# Patient Record
Sex: Female | Born: 1955 | Race: White | Hispanic: No | Marital: Married | Smoking: Former smoker
Health system: Southern US, Community
[De-identification: ages and names within clinical notes are randomized; demographics above are authoritative.]

## PROBLEM LIST (undated history)

## (undated) DIAGNOSIS — D649 Anemia, unspecified: Secondary | ICD-10-CM

## (undated) DIAGNOSIS — G4700 Insomnia, unspecified: Secondary | ICD-10-CM

## (undated) DIAGNOSIS — K219 Gastro-esophageal reflux disease without esophagitis: Secondary | ICD-10-CM

## (undated) DIAGNOSIS — R197 Diarrhea, unspecified: Secondary | ICD-10-CM

## (undated) DIAGNOSIS — K449 Diaphragmatic hernia without obstruction or gangrene: Secondary | ICD-10-CM

## (undated) DIAGNOSIS — E782 Mixed hyperlipidemia: Secondary | ICD-10-CM

## (undated) DIAGNOSIS — Z8619 Personal history of other infectious and parasitic diseases: Secondary | ICD-10-CM

## (undated) DIAGNOSIS — J42 Unspecified chronic bronchitis: Secondary | ICD-10-CM

## (undated) DIAGNOSIS — I10 Essential (primary) hypertension: Secondary | ICD-10-CM

## (undated) DIAGNOSIS — F329 Major depressive disorder, single episode, unspecified: Secondary | ICD-10-CM

## (undated) HISTORY — DX: Diarrhea, unspecified: R19.7

## (undated) HISTORY — DX: Essential (primary) hypertension: I10

## (undated) HISTORY — DX: Insomnia, unspecified: G47.00

## (undated) HISTORY — DX: Personal history of other infectious and parasitic diseases: Z86.19

## (undated) HISTORY — DX: Diaphragmatic hernia without obstruction or gangrene: K44.9

## (undated) HISTORY — DX: Mixed hyperlipidemia: E78.2

## (undated) HISTORY — DX: Anemia, unspecified: D64.9

## (undated) HISTORY — PX: TONSILLECTOMY: SUR1361

## (undated) HISTORY — DX: Major depressive disorder, single episode, unspecified: F32.9

## (undated) HISTORY — DX: Gastro-esophageal reflux disease without esophagitis: K21.9

---

## 2000-03-29 ENCOUNTER — Ambulatory Visit (HOSPITAL_BASED_OUTPATIENT_CLINIC_OR_DEPARTMENT_OTHER): Admission: RE | Admit: 2000-03-29 | Discharge: 2000-03-29 | Payer: Self-pay | Admitting: General Surgery

## 2000-03-29 ENCOUNTER — Encounter (INDEPENDENT_AMBULATORY_CARE_PROVIDER_SITE_OTHER): Payer: Self-pay | Admitting: *Deleted

## 2000-11-26 ENCOUNTER — Other Ambulatory Visit: Admission: RE | Admit: 2000-11-26 | Discharge: 2000-11-26 | Payer: Self-pay | Admitting: Obstetrics and Gynecology

## 2001-11-20 ENCOUNTER — Encounter (INDEPENDENT_AMBULATORY_CARE_PROVIDER_SITE_OTHER): Payer: Self-pay | Admitting: *Deleted

## 2001-11-20 ENCOUNTER — Ambulatory Visit (HOSPITAL_BASED_OUTPATIENT_CLINIC_OR_DEPARTMENT_OTHER): Admission: RE | Admit: 2001-11-20 | Discharge: 2001-11-20 | Payer: Self-pay | Admitting: General Surgery

## 2001-12-22 ENCOUNTER — Other Ambulatory Visit: Admission: RE | Admit: 2001-12-22 | Discharge: 2001-12-22 | Payer: Self-pay | Admitting: Obstetrics and Gynecology

## 2003-02-15 ENCOUNTER — Other Ambulatory Visit: Admission: RE | Admit: 2003-02-15 | Discharge: 2003-02-15 | Payer: Self-pay | Admitting: *Deleted

## 2004-04-27 ENCOUNTER — Other Ambulatory Visit: Admission: RE | Admit: 2004-04-27 | Discharge: 2004-04-27 | Payer: Self-pay | Admitting: Obstetrics and Gynecology

## 2005-06-05 ENCOUNTER — Other Ambulatory Visit: Admission: RE | Admit: 2005-06-05 | Discharge: 2005-06-05 | Payer: Self-pay | Admitting: Obstetrics and Gynecology

## 2005-08-06 ENCOUNTER — Emergency Department (HOSPITAL_COMMUNITY): Admission: EM | Admit: 2005-08-06 | Discharge: 2005-08-06 | Payer: Self-pay | Admitting: Emergency Medicine

## 2005-08-06 ENCOUNTER — Ambulatory Visit: Payer: Self-pay | Admitting: Internal Medicine

## 2005-08-07 ENCOUNTER — Inpatient Hospital Stay (HOSPITAL_COMMUNITY): Admission: AD | Admit: 2005-08-07 | Discharge: 2005-08-10 | Payer: Self-pay | Admitting: Internal Medicine

## 2005-08-07 ENCOUNTER — Ambulatory Visit: Payer: Self-pay | Admitting: Internal Medicine

## 2005-08-20 ENCOUNTER — Ambulatory Visit: Payer: Self-pay | Admitting: Internal Medicine

## 2006-03-11 ENCOUNTER — Ambulatory Visit: Payer: Self-pay | Admitting: Internal Medicine

## 2007-08-04 ENCOUNTER — Ambulatory Visit: Payer: Self-pay | Admitting: Internal Medicine

## 2008-03-06 ENCOUNTER — Encounter: Payer: Self-pay | Admitting: *Deleted

## 2008-03-06 DIAGNOSIS — Z9189 Other specified personal risk factors, not elsewhere classified: Secondary | ICD-10-CM | POA: Insufficient documentation

## 2008-03-06 DIAGNOSIS — Z9089 Acquired absence of other organs: Secondary | ICD-10-CM | POA: Insufficient documentation

## 2008-03-06 DIAGNOSIS — Z872 Personal history of diseases of the skin and subcutaneous tissue: Secondary | ICD-10-CM | POA: Insufficient documentation

## 2008-03-06 DIAGNOSIS — K219 Gastro-esophageal reflux disease without esophagitis: Secondary | ICD-10-CM | POA: Insufficient documentation

## 2008-03-06 DIAGNOSIS — Z8719 Personal history of other diseases of the digestive system: Secondary | ICD-10-CM | POA: Insufficient documentation

## 2008-07-28 ENCOUNTER — Ambulatory Visit: Payer: Self-pay | Admitting: Internal Medicine

## 2008-07-28 DIAGNOSIS — G44209 Tension-type headache, unspecified, not intractable: Secondary | ICD-10-CM | POA: Insufficient documentation

## 2008-07-29 ENCOUNTER — Telehealth: Payer: Self-pay | Admitting: Internal Medicine

## 2008-11-05 ENCOUNTER — Ambulatory Visit: Payer: Self-pay | Admitting: Internal Medicine

## 2008-11-06 DIAGNOSIS — I10 Essential (primary) hypertension: Secondary | ICD-10-CM | POA: Insufficient documentation

## 2008-12-06 ENCOUNTER — Ambulatory Visit: Payer: Self-pay | Admitting: Internal Medicine

## 2008-12-06 LAB — CONVERTED CEMR LAB
CO2: 28 meq/L (ref 19–32)
Calcium: 9.6 mg/dL (ref 8.4–10.5)
Creatinine, Ser: 0.7 mg/dL (ref 0.4–1.2)

## 2010-01-17 ENCOUNTER — Ambulatory Visit: Payer: Self-pay | Admitting: Internal Medicine

## 2010-01-17 DIAGNOSIS — IMO0002 Reserved for concepts with insufficient information to code with codable children: Secondary | ICD-10-CM | POA: Insufficient documentation

## 2010-04-10 ENCOUNTER — Encounter (INDEPENDENT_AMBULATORY_CARE_PROVIDER_SITE_OTHER): Payer: Self-pay | Admitting: *Deleted

## 2010-06-06 ENCOUNTER — Encounter (INDEPENDENT_AMBULATORY_CARE_PROVIDER_SITE_OTHER): Payer: Self-pay | Admitting: *Deleted

## 2010-06-09 ENCOUNTER — Ambulatory Visit: Payer: Self-pay | Admitting: Gastroenterology

## 2010-11-06 ENCOUNTER — Ambulatory Visit
Admission: RE | Admit: 2010-11-06 | Discharge: 2010-11-06 | Payer: Self-pay | Source: Home / Self Care | Attending: Internal Medicine | Admitting: Internal Medicine

## 2010-11-06 DIAGNOSIS — M549 Dorsalgia, unspecified: Secondary | ICD-10-CM | POA: Insufficient documentation

## 2010-11-28 NOTE — Miscellaneous (Signed)
Summary: [facility] Previsit/prep  Clinical Lists Changes  Medications: Added new medication of MOVIPREP 100 GM  SOLR (PEG-KCL-NACL-NASULF-NA ASC-C) As per prep instructions. - Signed Rx of MOVIPREP 100 GM  SOLR (PEG-KCL-NACL-NASULF-NA ASC-C) As per prep instructions.;  #1 x 0;  Signed;  Entered by: Wyona Almas RN;  Authorized by: Meryl Dare MD Clementeen Graham;  Method used: Electronically to [facility] [address] [place], [place], [place]  [address], Ph: [phone], Fax: [phone] Observations: Added new observation of NKA: T (06/09/2010 8:57)    Prescriptions: MOVIPREP 100 GM  SOLR (PEG-KCL-NACL-NASULF-NA ASC-C) As per prep instructions.  #1 x 0   Entered by:   Wyona Almas RN   Authorized by:   Meryl Dare MD [facility]   Signed by:   Wyona Almas RN on 06/09/2010   Method used:   Electronically to        [facility]ail[facility]       [address]       [place], [place]  [address]       Ph: [phone]       Fax: [phone]   RxID:   [id number]

## 2010-11-28 NOTE — Letter (Signed)
Summary: Moviprep Instructions  [facility]  [address]   [place], [place] [address]   Phone: [phone]  Fax: [phone]       Morgan Harper    27-Sep-1956    MRN: [id number]        Procedure Day /Date:  Friday   06-23-10     Arrival Time:  9:00 a.m.     Procedure Time:  10:00 a.m.     Location of Procedure:                    x   [facility] (4th Floor)                        PREPARATION FOR COLONOSCOPY WITH MOVIPREP   Starting 5 days prior to your procedure 06-18-10 do not eat nuts, seeds, popcorn, corn, beans, peas,  salads, or any raw vegetables.  Do not take any fiber supplements (e.g. Metamucil, Citrucel, and Benefiber).  THE DAY BEFORE YOUR PROCEDURE         DATE: 06-22-10   DAY: Thursday  1.  Drink clear liquids the entire day-NO SOLID FOOD  2.  Do not drink anything colored red or purple.  Avoid juices with pulp.  No orange juice.  3.  Drink at least 64 oz. (8 glasses) of fluid/clear liquids during the day to prevent dehydration and help the prep work efficiently.  CLEAR LIQUIDS INCLUDE: Water Jello Ice Popsicles Tea (sugar ok, no milk/cream) Powdered fruit flavored drinks Coffee (sugar ok, no milk/cream) Gatorade Juice: apple, white grape, white cranberry  Lemonade Clear bullion, consomm, broth Carbonated beverages (any kind) Strained chicken noodle soup Hard Candy                             4.  In the morning, mix first dose of MoviPrep solution:    Empty 1 Pouch A and 1 Pouch B into the disposable container    Add lukewarm drinking water to the top line of the container. Mix to dissolve    Refrigerate (mixed solution should be used within 24 hrs)  5.  Begin drinking the prep at 5:00 p.m. The MoviPrep container is divided by 4 marks.   Every 15 minutes drink the solution down to the next mark (approximately 8 oz) until the full liter is complete.   6.  Follow completed prep with 16 oz of clear liquid of your  choice (Nothing red or purple).  Continue to drink clear liquids until bedtime.  7.  Before going to bed, mix second dose of MoviPrep solution:    Empty 1 Pouch A and 1 Pouch B into the disposable container    Add lukewarm drinking water to the top line of the container. Mix to dissolve    Refrigerate  THE DAY OF YOUR PROCEDURE      DATE:  06-23-10  DAY: Friday  Beginning at 5:00 a.m. (5 hours before procedure):         1. Every 15 minutes, drink the solution down to the next mark (approx 8 oz) until the full liter is complete.  2. Follow completed prep with 16 oz. of clear liquid of your choice.    3. You may drink clear liquids until 8:00 a.m.  (2 HOURS BEFORE PROCEDURE).   MEDICATION INSTRUCTIONS  Unless otherwise instructed, you should take regular prescription medications with a small sip of water  as early as possible the morning of your procedure.          OTHER INSTRUCTIONS  You will need a responsible adult at least 56 years of age to accompany you and drive you home.   This person must remain in the waiting room during your procedure.  Wear loose fitting clothing that is easily removed.  Leave jewelry and other valuables at home.  However, you may wish to bring a book to read or  an iPod/MP3 player to listen to music as you wait for your procedure to start.  Remove all body piercing jewelry and leave at home.  Total time from sign-in until discharge is approximately 2-3 hours.  You should go home directly after your procedure and rest.  You can resume normal activities the  day after your procedure.  The day of your procedure you should not:   Drive   Make legal decisions   Operate machinery   Drink alcohol   Return to work  You will receive specific instructions about eating, activities and medications before you leave.    The above instructions have been reviewed and explained to me by  Wyona Almas RN  June 09, 2010 9:17 AM     I  fully understand and can verbalize these instructions _____________________________ Date _________

## 2010-11-28 NOTE — Letter (Signed)
Summary: Previsit letter  [facility]  [address] [place], [place] [address]   Phone: [phone]  Fax: [phone]       04/10/2010 MRN: [id number]  [facility] [address] [place], [place]  [address]  Dear Ms. Galla,  Welcome to the Gastroenterology Division at [facility].    You are scheduled to see a nurse for your pre-procedure visit on 05/09/2010 at 9:30AM on the 3rd floor at [facility], [address]  We ask that you try to arrive at our office 15 minutes prior to your appointment time to allow for check-in.  Your nurse visit will consist of discussing your medical and surgical history, your immediate family medical history, and your medications.    Please bring a complete list of all your medications or, if you prefer, bring the medication bottles and we will list them.  We will need to be aware of both prescribed and over the counter drugs.  We will need to know exact dosage information as well.  If you are on blood thinners (Coumadin, Plavix, Aggrenox, Ticlid, etc.) please call our office today/prior to your appointment, as we need to consult with your physician about holding your medication.   Please be prepared to read and sign documents such as consent forms, a financial agreement, and acknowledgement forms.  If necessary, and with your consent, a friend or relative is welcome to sit-in on the nurse visit with you.  Please bring your insurance card so that we may make a copy of it.  If your insurance requires a referral to see a specialist, please bring your referral form from your primary care physician.  No co-pay is required for this nurse visit.     If you cannot keep your appointment, please call [phone] to cancel or reschedule prior to your appointment date.  This allows [place] the opportunity to schedule an appointment for another patient in need of care.    Thank you for choosing [facility] for your medical  needs.  We appreciate the opportunity to care for you.  Please visit [place] at our website  to learn more about our practice.                     Sincerely.                                                                                                                   The Gastroenterology Division

## 2010-11-28 NOTE — Assessment & Plan Note (Signed)
Summary: FU   STC   Vital Signs:  Patient profile:   55 year old female Height:      61 inches (154.94 cm) Weight:      154.75 pounds (70.34 kg) BMI:     29.35 O2 Sat:      97 % on Room air Temp:     98.2 degrees F (36.78 degrees C) oral Pulse rate:   93 / minute BP sitting:   150 / 90  (left arm) Cuff size:   large  Vitals Entered By: Bill Salinas CMA (January 17, 2010 3:27 PM) Sydell Axon SMA  O2 Flow:  Room air CC: Pt c/o sharp/shocking pain followed by numbness in L knee while kneeling. last fluvax 09/2009, last mammogram and pap 02/2009, but is scheduled for appts next month per pt. //[place]   Primary Care Provider:  Jnaya Butrick  CC:  Pt c/o sharp/shocking pain followed by numbness in L knee while kneeling. last fluvax 09/2009, last mammogram and pap 02/2009, and but is scheduled for appts next month per pt. //[place].  History of Present Illness: Having left knee pain with kneeling, tender to touch, numb. No pain with walking or standing. Minimal swelling. The pain with kneeling is "electrical" No recollection of injury or other precipitating event. It has been a problem for 6 months but it is getting worse.   Current Medications (verified): 1)  Prevacid 30 Mg  Cpdr (Lansoprazole) .... Take Once Daily  Allergies (verified): No Known Drug Allergies  Past History:  Past Medical History: Last updated: 11/05/2008 UNSPECIFIED ESSENTIAL HYPERTENSION (ICD-401.9) HEADACHE, TENSION (ICD-307.81) URTICARIA, HX OF (ICD-V13.3) INSOMNIA, HX OF (ICD-V15.89) TONSILLECTOMY, HX OF (ICD-V45.79) Hx of ELEVATED BLOOD PRESSURE (ICD-796.2) HIATAL HERNIA, HX OF (ICD-V12.79) GERD (ICD-530.81)      Past Surgical History: Last updated: 03/06/2008 TONSILLECTOMY, HX OF (ICD-V45.79)  Family History: Last updated: 07/28/2008 Father - 1936: doesn't know health history Mother - 1939: HTN, allergies, Asthma Neg- breast or colon cancer; CAD GF with DM  Social History: Last updated:  07/28/2008 [employer] Married '96 no children work: Insurance account manager.  Risk Factors: Smoking Status: current (03/06/2008)  Review of Systems  The patient denies anorexia, fever, weight loss, decreased hearing, hoarseness, syncope, dyspnea on exertion, prolonged cough, abdominal pain, severe indigestion/heartburn, muscle weakness, difficulty walking, and enlarged lymph nodes.    Physical Exam  General:  WNWD white female in no distress Head:  normocephalic.   Eyes:  pupils equal, pupils round, corneas and lenses clear, and no injection.   Lungs:  normal respiratory effort and normal breath sounds.   Heart:  normal rate and regular rhythm.   Msk:  left knee with normal range of motion, no click, no crepitis, no deformity. Very small effusion. Tender to palp at the lateral aspect - over the tibial condyle.  Neurologic:  small area of decreased sensation over the lateral tibial condyle.   Impression & Recommendations:  Problem # 1:  PES ANSERINUS TENDINITIS OR BURSITIS (ICD-726.61) Normal joint. Exam is consistent with pes anserinus tendonitis with bursitis. Pain with kneeling due to bursitis with effusion.  Plan - heat and lineament           otc NSAIDs           if no relief - steroid injection  Complete Medication List: 1)  Prevacid 30 Mg Cpdr (Lansoprazole) .... Take once daily  Other Orders: Tdap => 21yrs IM ([address]) Admin 1st Vaccine ([address])    Immunizations Administered:  Tetanus Vaccine:    Vaccine Type: Tdap    Site: right deltoid    Mfr: GlaxoSmithKline    Dose: 0.5 ml    Route: IM    Given by: Ami Bullins CMA //Administered by Sydell Axon SMA    Exp. Date: 01/21/2012    Lot #: [id number]    VIS given: 09/16/07 version given January 17, 2010.

## 2010-11-30 NOTE — Assessment & Plan Note (Signed)
Summary: pulled back muscle/cd   Vital Signs:  Patient profile:   55 year old female Height:      61 inches Weight:      149.38 pounds BMI:     28.33 O2 Sat:      97 % on Room air Temp:     98 degrees F oral Pulse rate:   95 / minute BP sitting:   162 / 102  (left arm) Cuff size:   regular  Vitals Entered By: Zella Ball Ewing CMA Duncan Dull) (November 06, 2010 2:52 PM)  O2 Flow:  Room air CC: pulled muscle in back/RE   Primary Care Provider:  Norins  CC:  pulled muscle in back/RE.  History of Present Illness: here with acute onset 2-3 days left back pain, mild to mod, constant dull with sharp flares, starting after packing up xmas decorations in the home and lifitng and twisting heavy boxes;  pain worse to bend adn twist since then, and just not improved even after 3 days. heating pad helps temporarily.  No bowel or bladder changes. , or radiation LE pain/weak/numb. Alleve no help this am , so made appt.  No trauma, fall, fever, recetn wt loss. Pt denies CP, worsening sob, doe, wheezing, orthopnea, pnd, worsening LE edema, palps, dizziness or syncope  Pt denies new neuro symptoms such as headache, facial or extremity weakness   Preventive Screening-Counseling & Management      Drug Use:  no.    Problems Prior to Update: 1)  Back Pain  (ICD-724.5) 2)  Pes Anserinus Tendinitis or Bursitis  (ICD-726.61) 3)  Unspecified Essential Hypertension  (ICD-401.9) 4)  Headache, Tension  (ICD-307.81) 5)  Urticaria, Hx of  (ICD-V13.3) 6)  Insomnia, Hx of  (ICD-V15.89) 7)  Tonsillectomy, Hx of  (ICD-V45.79) 8)  Hx of Elevated Blood Pressure  (ICD-796.2) 9)  Hiatal Hernia, Hx of  (ICD-V12.79) 10)  Gerd  (ICD-530.81)  Medications Prior to Update: 1)  Prevacid 30 Mg  Cpdr (Lansoprazole) .... Take Once Daily  Current Medications (verified): 1)  Prevacid 30 Mg  Cpdr (Lansoprazole) .... Take Once Daily 2)  Tramadol Hcl 50 Mg Tabs (Tramadol Hcl) .Marland Kitchen.. 1po Q 6 Hrs As Needed 3)  Flexeril 5 Mg Tabs  (Cyclobenzaprine Hcl) .Marland Kitchen.. 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  atraumatic.   Eyes:  pupils equal, pupils round, corneas and lenses clear, and no injection.   Ears:  R ear normal and L ear normal.   Nose:  no external deformity and no nasal discharge.   Mouth:  no gingival abnormalities and pharynx pink and moist.   Neck:  supple and no masses.  Lungs:  normal respiratory effort and normal breath sounds.   Heart:  normal rate and regular rhythm.   Abdomen:  soft, non-tender, and normal bowel sounds.   Msk:  no joint tenderness and no joint swelling., spine nontender, no paravertebral tender or swelling;  has some mild left flank tender, no rash Extremities:  no edema, no erythema  Neurologic:  strength normal in all extremities, sensation intact to light touch, and gait normal.     Impression & Recommendations:  Problem # 1:  BACK PAIN (ICD-724.5)  left flank area - agree most likely MSK strain , mod to severe and persistent - treat as above,  f/u any worsening signs or symptoms   Her updated medication list for this problem includes:    Tramadol Hcl 50 Mg Tabs (Tramadol hcl) .Marland Kitchen... 1po q 6 hrs as needed    Flexeril 5 Mg Tabs (Cyclobenzaprine hcl) .Marland Kitchen... 1po three times a day as needed  Complete Medication List: 1)  Prevacid 30 Mg Cpdr (Lansoprazole) .... Take once daily 2)  Tramadol Hcl 50 Mg Tabs (Tramadol hcl) .Marland Kitchen.. 1po q 6 hrs as needed 3)  Flexeril 5 Mg Tabs (Cyclobenzaprine hcl) .Marland Kitchen.. 1po three times a day as needed  Patient Instructions: 1)  Please take all new medications as prescribed 2)  Continue all previous medications as before this visit  3)  Please schedule an appointment with your primary doctor as needed Prescriptions: FLEXERIL 5 MG TABS (CYCLOBENZAPRINE HCL) 1po three times a day as needed  #50 x 1   Entered and Authorized by:   Corwin Levins MD   Signed by:   Corwin Levins MD on 11/06/2010   Method used:   Print then Give to Patient   RxID:   [id number] TRAMADOL HCL 50 MG TABS (TRAMADOL HCL) 1po q 6 hrs as needed  #50 x 0   Entered and Authorized by:   Corwin Levins MD   Signed by:   Corwin Levins MD on 11/06/2010   Method used:   Print then Give to Patient   RxID:   [id number]    Orders Added: 1)  Est. Patient Level III [84696]

## 2011-03-16 NOTE — Discharge Summary (Signed)
Morgan Harper, MOYD              ACCOUNT NO.:  [id number]   MEDICAL RECORD NO.:  [id number]          PATIENT TYPE:  INP   LOCATION:  3031                         FACILITY:  [facility]   PHYSICIAN:  Rene Paci, M.D. LHCDATE OF BIRTH:  Jun 19, 1956   DATE OF ADMISSION:  08/07/2005  DATE OF DISCHARGE:  08/10/2005                                 DISCHARGE SUMMARY   DISCHARGE DIAGNOSIS:  Refractory urticaria.   HISTORY OF PRESENT ILLNESS:  The patient is a 55 year old white female who  was admitted on Friday, October with complaints of hives and itching.  The  patient was seen at urgent care and given IM steroids and IM Benadryl, as  well as started on p.o. prednisone.  The patient had a recurrent flare on  August 06, 2005 with a repeat injection of IM steroids and Benadryl and was  then seen by Dr. Debby Bud in the office on that day  The patient was admitted  for IV steroids.   PAST MEDICAL HISTORY:  GERD with hiatal hernia and no other significant  medical illnesses.   PAST SURGICAL HISTORY:  Tonsillectomy.   COURSE IN HOSPITAL:  Problem:  REFRACTORY URTICARIA. The patient was  admitted and placed on IV Solu-Medrol.  In addition, the patient was placed  on a proton pump inhibitor and loratadine.  The patient was noted to have  improvement in her symptoms with IV steroids and wishes to go home at this  time.  Our plan is to discontinued the patient to home with p.o. prednisone  taper.   MEDICATIONS AT DISCHARGE:  1.  Promethium.  Patient to resume same dose as before admission.  2.  Trest.  The patient is to resume same dose as before admission.  3.  Ioban 150 mg p.o. b.i.d.  4.  Prevacid 30 mg p.o. daily.  5.  Zyrtec 10 mg p.o. daily.  6.  Ranitidine 150 mg p.o. daily.  7.  Prednisone 50 mg p.o. q.12 h. 2 doses, then 40 mg q.12 h. x2 doses, then      40 mg p.o. daily for 4 days, then 20 mg p.o. daily for 4 days, then 10      mg daily for 6 days, then stop.  8.  Xanax 0.5 mg  every 6 hours p.r.n.  9.  Ambien 5 mg p.o. h.s. p.r.n.  10. Prescription provided for 50 tablets of 10 mg prednisone.  11. In addition the patient was given a prescription for 30 tablets of Xanax      0.5 mg and 20 tablets of Ambien 5 mg.   LABORATORIES AT DISCHARGE:  Hemoglobin 13.5, hematocrit 40.3, BUN 15,  creatinine 0.8.   FOLLOWUP:  A followup appointment has been scheduled for the patient to see  Dr. Debby Bud on October 23 at 9:15 a.m. The patient is to call Dr. Debby Bud  should she develop hives and go to the emergency room should she develop  shortness of breath.      Melissa S. Peggyann Juba, NP      Rene Paci, M.D. [facility]  Electronically Signed    MSO/MEDQ  D:  08/10/2005  T:  08/10/2005  Job:  [id number]

## 2011-03-16 NOTE — Op Note (Signed)
[place]  Patient:    NETASHA, WEHRLI Visit Number: [id number] MRN: [id number]          Service Type: DSU Location: [facility] Attending Physician:  Brandy Hale Dictated by:   Angelia Mould. Derrell Lolling, M.D. Proc. Date: 11/20/01 Admit Date:  11/20/2001   CC:         Sonda Primes, M.D. [facility]   Operative Report  PREOPERATIVE DIAGNOSIS: 1. A 2.0 cm subcutaneous cyst left base of neck. 2. An 8 mm diameter cyst right base of neck.  POSTOPERATIVE DIAGNOSIS: 1. A 2.0 cm subcutaneous cyst left base of neck. 2. An 8 mm diameter cyst right base of neck.  OPERATION PERFORMED:  Excision of 2.0 cm cyst left base of neck and excision of 8 mm cyst right base of neck.  SURGEON:  Angelia Mould. Derrell Lolling, M.D.  ANESTHESIA:  INDICATIONS FOR PROCEDURE:  The patient is a 55 year old white female who presents with a chronically inflamed sebaceous cyst of the left posterior neck at the base and also has a smaller cyst on the right side.  She has had these before and would like to have these excised to prevent abscess formation.  She is brought to the operating room electively.  DESCRIPTION OF PROCEDURE:  The patient was brought to the minor procedure room at the [facility] and placed prone on the operating table. The neck and shoulders were prepped and draped in a sterile fashion. Xylocaine 1% with epinephrine was used as local infiltration anesthetic.  In the left base of neck I made a 2.5 cm long to 3.0 cm long elliptical incision transversely and dissection was carried down to the deep subcutaneous tissue excising what appeared to be a chronically inflamed sebaceous cyst. The cyst and its wall was completely excised.  In the the right neck base, I made a 1.5 cm long elliptical incision transversely and excised a smaller sebaceous cyst.  Both of these wounds irrigated with dilute Betadine and saline.  Hemostasis was excellent.  Both wounds  were closed with subcuticular sutures of 3-0 Monocryl and Steri-Strips.  Clean bandages were placed.  The patient tolerated the procedure well and was returned to the waiting room in excellent condition.  Estimated blood loss was about 10 cc. Complications were none.  Sponge, needle and instrument counts were correct. Dictated by:   Angelia Mould. Derrell Lolling, M.D. Attending Physician:  Brandy Hale DD:  11/20/01 TD:  11/21/01 Job: [id number]

## 2011-03-16 NOTE — H&P (Signed)
Morgan Harper, Morgan Harper              ACCOUNT NO.:  [id number]   MEDICAL RECORD NO.:  [id number]           PATIENT TYPE:   LOCATION:                                 FACILITY:   PHYSICIAN:  Rosalyn Gess. Norins, M.D. [facility] OF BIRTH:  03-Sep-1956   DATE OF ADMISSION:  08/07/2005  DATE OF DISCHARGE:                                HISTORY & PHYSICAL   CHIEF COMPLAINT:  Urticaria.   HISTORY OF PRESENT ILLNESS:  Ms. Renk is a 55 year old Caucasian woman  who had the onset on Friday, October 6 of hives with itching.  She was seen  at urgent care, was given IM steroids, IM Benadryl, started on H2 blocker,  H1 blocker therapy as well as Zyrtec.  Patient was also started on  outpatient oral prednisone at 40 mg daily x2, 20 mg daily x2, 10 mg daily  x3.  She had a recurrent flare of urticaria and was seen back at urgent care  on August 06, 2005 in the a.m.  Was given a repeat injection of IM steroids  and Benadryl.  I saw her in the office August 06, 2005.  At that time she  still had some visible urticaria and was also having pruritus.  Patient was  told to increase her oral prednisone dose to 40 mg daily x3, 20 mg daily x3,  10 mg daily x6 and was to continue on H1 and H2 blocker therapy.  Patient  now returns because of resurgence of her urticaria with increasing pruritus  despite outpatient therapy with oral medications.  Patient is now for  admission for IV steroids.  Of note, the most likely precipitating factor  was nicotine patch, although we cannot be sure of the etiology of her  urticaria.   PAST MEDICAL HISTORY:   SURGICAL:  Tonsillectomy.   MEDICAL:  1.  Usual childhood diseases.  2.  GERD with hiatal hernia with no other significant medical illnesses.   HABITS:  Tobacco.  Patient has just recently quit smoking with a significant  smoking history.  No alcohol use.  Patient has no known prior drug  allergies.   SOCIAL HISTORY:  Patient is married.  She works in Airline [employer].  She  has a  supportive husband.   REVIEW OF SYSTEMS:  Negative for any fevers, sweats, or chills.  Her weight  has been stable.  She has had no cardiovascular, respiratory, GI, or GU  complaints.   PHYSICAL EXAMINATION:  VITAL SIGNS:  Patient is afebrile.  Blood pressure  140/88, heart rate 76, respirations 14 and unlabored.  GENERAL:  Well-nourished, well-developed woman who is in no acute distress.  HEENT:  Normocephalic, atraumatic.  EACs and TMs were normal.  Oropharynx  was without lesions.  Conjunctivae and sclerae were clear.  Pupils are  equal, round, and reactive to light and accommodation.  NECK:  Supple without thyromegaly.  NODES:  No adenopathy was noted in the cervical, supraclavicular regions.  CHEST:  No CVA tenderness.  LUNGS:  Clear to auscultation and percussion.  CARDIOVASCULAR:  2+ radial pulse.  No JVD or carotid bruits.  She  had a  quiet precordium with regular rate and rhythm without murmurs, rubs, or  gallops.  BREASTS:  Deferred to gynecology.  ABDOMEN:  Soft.  No guarding or rebound.  No organo/splenomegaly was noted.  PELVIC:  Deferred.  RECTAL:  Deferred.  EXTREMITIES:  Without clubbing, cyanosis.  No edema was noted.  There was no  deformity.  DERMATOLOGIC:  Patient has widespread macular, papular, erythematous lesions  starting at her forehead going all the way down to her toes with a  serpiginous outline which is changing as I look at it with significant  target lesions developing on her distal lower extremities.  Of note, the  patient in the 20 minutes I have been seeing her is having progressive  urticaria.  NEUROLOGIC:  Nonfocal with patient being awake, alert, oriented to person,  place, time, and context.   ASSESSMENT/PLAN:  1.  Refractory urticaria.  Patient with ongoing progressive urticaria      despite two rounds of IM steroids, oral steroids, H2 blocker, H1      blockers.  She has significant pruritus with this.  She has had no       respiratory distress or discomfort.  Precipitating factor may have been      a nicotine patch, although the patch has now been off for five or six      days.  There were no other changes in the patient's history.  She has      had no new foods, no new cosmetics, no new toweling, no new detergents      or other contact allergens.   PLAN:  Regular medical admission.  Solu-Medrol 80 mg IV q.8h. x3 doses, 80  mg IV q.12h. x2 doses, then 40 mg IV q.12h. if her urticaria is improving.  Loratadine 10 mg b.i.d., ranitidine 150 mg b.i.d., Xanax 0.5 mg q.6h.  p.r.n., Restoril 15 mg p.o. q.h.s. p.r.n., Protonix 40 mg p.o. q.a.m.,  laxative of choice, hydroxyzine 25 mg p.o. q.8h. p.r.n. refractory pruritus.  Laboratory work will include a CMET, ESR, and CBC with differential.  If the  patient's symptoms do not respond to this round of therapy, would need to  consult with allergy for in-hospital consultation.           ______________________________  Rosalyn Gess Norins, M.D. [facility]     MEN/MEDQ  D:  08/07/2005  T:  08/07/2005  Job:  [id number]

## 2011-03-16 NOTE — Op Note (Signed)
[place]. [facility]  Patient:    RAYME, BUI                       MRN: [id number] Proc. Date: 03/29/00 Adm. Date:  [id number] Disc. Date: [id number] Attending:  Brandy Hale CC:         Sonda Primes, M.D. [facility]                           Operative Report  PREOPERATIVE DIAGNOSIS:  Infected sebaceous cyst, left neck.  POSTOPERATIVE DIAGNOSIS:  Infected sebaceous cyst, left neck.  OPERATION:  Excision of 1.0 cm in diameter infected sebaceous cyst, left posterior neck.  SURGEON:  Angelia Mould. Derrell Lolling, M.D.  ASSISTANT:  ANESTHESIA:  INDICATIONS:  This is a 55 year old white female who developed a painful reddened area in the left posterior neck about three and a half weeks ago. She states this was the size of a golf ball.  She was placed on antibiotics and the pain and swelling subsided rapidly.  She has a small residual chronically inflamed cyst in the left posterior neck overlying the trapezius muscle.  She wants to have this excised to eliminate the risk of future infection.  DESCRIPTION OF PROCEDURE:  The patient was brought to the [facility] to the minor surgery room.  The incision was marked in Langers lines. The left posterior neck was prepped and draped in a sterile fashion with the patient in the prone position.  One percent Xylocaine with epinephrine was used for local infiltration anesthetic.  A transverse elliptical incision was made approximately 2.0 cm in length to excise the entire thickened area. Dissection was carried down into the deep subcutaneous tissue where there was a lot of chronic inflammation and cyst wall.  There was no abscess. Hemostasis was excellent.  The wound was irrigated with saline.  The skin was closed with several interrupted sutures of 4-0 nylon.  A clean bandage was placed and the patient was taken back to the waiting room in excellent condition.  The estimated blood loss was about 2 cc.  No  complications.  The sponge, needle, and instrument counts were correct. DD:  03/29/00 TD:  04/02/00 Job: [id number]

## 2011-10-02 ENCOUNTER — Ambulatory Visit (INDEPENDENT_AMBULATORY_CARE_PROVIDER_SITE_OTHER): Payer: BC Managed Care – PPO | Admitting: Internal Medicine

## 2011-10-02 ENCOUNTER — Encounter: Payer: Self-pay | Admitting: Internal Medicine

## 2011-10-02 VITALS — BP 152/84 | HR 88 | Temp 98.2°F | Ht 61.0 in | Wt 145.0 lb

## 2011-10-02 DIAGNOSIS — F17200 Nicotine dependence, unspecified, uncomplicated: Secondary | ICD-10-CM

## 2011-10-02 DIAGNOSIS — Z23 Encounter for immunization: Secondary | ICD-10-CM

## 2011-10-02 DIAGNOSIS — Z720 Tobacco use: Secondary | ICD-10-CM

## 2011-10-02 MED ORDER — SERTRALINE HCL 25 MG PO TABS: 25.0000 mg | ORAL_TABLET | Freq: Every day | ORAL | Status: DC

## 2011-10-02 MED ORDER — LANSOPRAZOLE 30 MG PO CPDR: 30.0000 mg | DELAYED_RELEASE_CAPSULE | Freq: Every day | ORAL | Status: DC

## 2011-10-02 NOTE — Progress Notes (Signed)
  Subjective:    Patient ID: Morgan Harper, female    DOB: 1956/07/28, 55 y.o.   MRN: [id number]  HPI Morgan Harper is seen for follow-up after having bronchitis for which she was seen at [facility] on 68: treated with prednisone, z-pak, hydrocodone cough syrup. She had heaviness in her chest and SOB. She is now doing miuch better but is still a little SOB.  She has not smoked for 21 weeks.    Review of Systems     Objective:   Physical Exam        Assessment & Plan:

## 2011-10-02 NOTE — Patient Instructions (Signed)
  Bronchitis - looks resolved. You will get your full wind back over the next week or two.  Smoking cessation - the most important thing you can do for your health!!!!!!!! Keep up the good work. Think about those situations when you are most tempted to smoke and be prepared to resist. For the "edginess" will have you take zoloft (generic) very low dose, 25 mg once a day. It causes "mellowness" more than fatigue. Let me know if you have any problems.

## 2012-01-02 ENCOUNTER — Other Ambulatory Visit: Payer: Self-pay | Admitting: Internal Medicine

## 2012-08-25 ENCOUNTER — Other Ambulatory Visit: Payer: Self-pay | Admitting: *Deleted

## 2012-08-25 MED ORDER — SERTRALINE HCL 25 MG PO TABS: ORAL_TABLET | ORAL | Status: DC

## 2012-08-25 NOTE — Telephone Encounter (Signed)
R'cd fax from [facility] for refill of Sertraline.

## 2012-10-03 ENCOUNTER — Other Ambulatory Visit: Payer: Self-pay | Admitting: Internal Medicine

## 2012-12-15 ENCOUNTER — Other Ambulatory Visit: Payer: Self-pay | Admitting: Internal Medicine

## 2012-12-16 ENCOUNTER — Other Ambulatory Visit: Payer: Self-pay | Admitting: *Deleted

## 2012-12-16 MED ORDER — SERTRALINE HCL 25 MG PO TABS: ORAL_TABLET | ORAL | Status: DC

## 2013-02-13 ENCOUNTER — Other Ambulatory Visit: Payer: Self-pay | Admitting: Internal Medicine

## 2013-02-20 ENCOUNTER — Other Ambulatory Visit: Payer: Self-pay | Admitting: Internal Medicine

## 2013-03-09 ENCOUNTER — Encounter: Payer: Self-pay | Admitting: Internal Medicine

## 2013-03-09 ENCOUNTER — Ambulatory Visit (INDEPENDENT_AMBULATORY_CARE_PROVIDER_SITE_OTHER): Payer: BC Managed Care – PPO | Admitting: Internal Medicine

## 2013-03-09 VITALS — BP 168/98 | HR 92 | Temp 98.4°F

## 2013-03-09 DIAGNOSIS — R059 Cough, unspecified: Secondary | ICD-10-CM

## 2013-03-09 DIAGNOSIS — K449 Diaphragmatic hernia without obstruction or gangrene: Secondary | ICD-10-CM

## 2013-03-09 DIAGNOSIS — R05 Cough: Secondary | ICD-10-CM

## 2013-03-09 DIAGNOSIS — J209 Acute bronchitis, unspecified: Secondary | ICD-10-CM

## 2013-03-09 DIAGNOSIS — Z8719 Personal history of other diseases of the digestive system: Secondary | ICD-10-CM

## 2013-03-09 DIAGNOSIS — K219 Gastro-esophageal reflux disease without esophagitis: Secondary | ICD-10-CM

## 2013-03-09 DIAGNOSIS — Z7189 Other specified counseling: Secondary | ICD-10-CM

## 2013-03-09 DIAGNOSIS — Z716 Tobacco abuse counseling: Secondary | ICD-10-CM

## 2013-03-09 MED ORDER — SERTRALINE HCL 25 MG PO TABS: ORAL_TABLET | ORAL | Status: DC

## 2013-03-09 MED ORDER — HYDROCODONE-HOMATROPINE 5-1.5 MG/5ML PO SYRP: 5.0000 mL | ORAL_SOLUTION | Freq: Three times a day (TID) | ORAL | Status: DC | PRN

## 2013-03-09 MED ORDER — METHYLPREDNISOLONE ACETATE 80 MG/ML IJ SUSP
80.0000 mg | Freq: Once | INTRAMUSCULAR | Status: AC
  Administered 2013-03-09: 80 mg via INTRAMUSCULAR

## 2013-03-09 MED ORDER — LANSOPRAZOLE 30 MG PO CPDR: DELAYED_RELEASE_CAPSULE | ORAL | Status: DC

## 2013-03-09 MED ORDER — AZITHROMYCIN 250 MG PO TABS: ORAL_TABLET | ORAL | Status: DC

## 2013-03-09 MED ORDER — ALBUTEROL SULFATE HFA 108 (90 BASE) MCG/ACT IN AERS: 2.0000 | INHALATION_SPRAY | Freq: Four times a day (QID) | RESPIRATORY_TRACT | Status: DC | PRN

## 2013-03-09 NOTE — Assessment & Plan Note (Signed)
Well controlled on current therapy Refilled prevacid today

## 2013-03-09 NOTE — Assessment & Plan Note (Signed)
Well controlled on current therapy Refilled prevacid today 

## 2013-03-09 NOTE — Progress Notes (Signed)
Subjective:    Patient ID: Morgan Harper, female    DOB: [birth year], 57 y.o.   MRN: [id number]  HPI  Pt presents to the clinic today with cough and congestion x 8 days. It originally started out as allergies. She started taking some zyrtec and progressively felt better. Over the last week though it has progressed down into her chest. She does feel some shortness of breath. She c/o incessant coughing productive of light yellow sputum. She denies fever, chills, nausea or body aches. She is unsure if she has had sick contacts. She does have a history of allergies and asthma. She does smoke and is trying to currently quit with the use of Zoloft. She does need a refill of that today. She also needs a refill of her prevacid for her hiatal hernia.  Review of Systems      Past Medical History  Diagnosis Date  . Hypertension   . Insomnia   . Hiatal hernia   . GERD (gastroesophageal reflux disease)     Current Outpatient Prescriptions  Medication Sig Dispense Refill  . lansoprazole (PREVACID) 30 MG capsule TAKE 1 CAPSULE (30 MG TOTAL) BY MOUTH DAILY.  30 capsule  3  . sertraline (ZOLOFT) 25 MG tablet TAKE 1 TABLET (25 MG TOTAL) BY MOUTH DAILY.  90 tablet  0  . albuterol (PROVENTIL HFA;VENTOLIN HFA) 108 (90 BASE) MCG/ACT inhaler Inhale 2 puffs into the lungs every 6 (six) hours as needed for wheezing.  1 Inhaler  0  . azithromycin (ZITHROMAX) 250 MG tablet Take 2 tablets today, then 1 tablet daily for 4 days  6 tablet  0  . HYDROcodone-homatropine (HYCODAN) 5-1.5 MG/5ML syrup Take 5 mLs by mouth every 8 (eight) hours as needed for cough.  120 mL  0   No current facility-administered medications for this visit.    No Known Allergies  Family History  Problem Relation Age of Onset  . Hypertension Mother   . Allergies Mother   . Asthma Mother   . Heart disease Neg Hx     History   Social History  . Marital Status: Married    Spouse Name: N/A    Number of Children: N/A  . Years of  Education: N/A   Occupational History  . Not on file.   Social History Main Topics  . Smoking status: Current Some Day Smoker  . Smokeless tobacco: Not on file  . Alcohol Use: Not on file  . Drug Use: No  . Sexually Active: Not on file   Other Topics Concern  . Not on file   Social History Narrative  . No narrative on file     Constitutional: Denies fever, malaise, fatigue, headache or abrupt weight changes.  HEENT: Denies eye pain, eye redness, ear pain, ringing in the ears, wax buildup, runny nose, nasal congestion, bloody nose, or sore throat. Respiratory: Pt reports shortness of breath and cough. Denies difficulty breathing.  Cardiovascular: Denies chest pain, chest tightness, palpitations or swelling in the hands or feet.  Gastrointestinal: Pt reports reflux. Denies abdominal pain, bloating, constipation, diarrhea or blood in the stool.     No other specific complaints in a complete review of systems (except as listed in HPI above).  Objective:   Physical Exam BP 168/98  Pulse 92  Temp(Src) 98.4 F (36.9 C) (Oral)  SpO2 94% Wt Readings from Last 3 Encounters:  10/02/11 145 lb (65.772 kg)  11/06/10 149 lb 6.1 oz (67.759 kg)  01/17/10 154 lb 12  oz (70.194 kg)    General: Appears her stated age, well developed, well nourished in NAD. Skin: Warm, dry and intact. No rashes, lesions or ulcerations noted. HEENT: Head: normal shape and size; Eyes: sclera white, no icterus, conjunctiva pink, PERRLA and EOMs intact; Ears: Tm's gray and intact, normal light reflex; Nose: mucosa pink and moist, septum midline; Throat/Mouth: Teeth present, mucosa pink and moist, no exudate, lesions or ulcerations noted.  Neck: Normal range of motion. Neck supple, trachea midline. No massses, lumps or thyromegaly present.  Cardiovascular: Normal rate and rhythm. S1,S2 noted.  No murmur, rubs or gallops noted. No JVD or BLE edema. No carotid bruits noted. Pulmonary/Chest: Normal effort and  bilateral expiratory wheeze and scattered rhonchi throughout. No respiratory distress.  Abdomen: Soft and nontender. Normal bowel sounds, no bruits noted. No distention or masses noted. Liver, spleen and kidneys non palpable. Psychiatric: Mood and affect normal. Behavior is normal. Judgment and thought content normal.           Assessment & Plan:   Acute bronchitis, new onset:  Ibuprofen as needed if you have fever Drink plenty of fluids and get some rest eRx for azithromax x 5 days 80 mg Depo IM now eRx for hycodan cough syrup eRx for albuterol inhaler  Smoking cessation, chronic:  Refill zoloft today

## 2013-03-09 NOTE — Patient Instructions (Signed)

## 2013-03-17 ENCOUNTER — Ambulatory Visit (INDEPENDENT_AMBULATORY_CARE_PROVIDER_SITE_OTHER): Payer: BC Managed Care – PPO | Admitting: Internal Medicine

## 2013-03-17 ENCOUNTER — Encounter: Payer: Self-pay | Admitting: Internal Medicine

## 2013-03-17 ENCOUNTER — Ambulatory Visit (INDEPENDENT_AMBULATORY_CARE_PROVIDER_SITE_OTHER)
Admission: RE | Admit: 2013-03-17 | Discharge: 2013-03-17 | Disposition: A | Discharge status: Home / Self Care | Payer: BC Managed Care – PPO | Source: Ambulatory Visit | Attending: Internal Medicine | Admitting: Internal Medicine

## 2013-03-17 VITALS — BP 142/86 | HR 98 | Temp 98.4°F

## 2013-03-17 DIAGNOSIS — J209 Acute bronchitis, unspecified: Secondary | ICD-10-CM

## 2013-03-17 DIAGNOSIS — R05 Cough: Secondary | ICD-10-CM

## 2013-03-17 DIAGNOSIS — R059 Cough, unspecified: Secondary | ICD-10-CM

## 2013-03-17 MED ORDER — LEVOFLOXACIN 500 MG PO TABS: 500.0000 mg | ORAL_TABLET | Freq: Every day | ORAL | Status: DC

## 2013-03-17 MED ORDER — BENZONATATE 100 MG PO CAPS: 100.0000 mg | ORAL_CAPSULE | Freq: Two times a day (BID) | ORAL | Status: DC | PRN

## 2013-03-17 NOTE — Progress Notes (Signed)
Subjective:    Patient ID: Morgan Harper, female    DOB: [birth year], 57 y.o.   MRN: [id number]  HPI  Pt presents to the clinic today with cough and congestion x 12 days. It originally started out as allergies. She started taking some zyrtec and progressively felt better. Over the last week though it has progressed down into her chest. She does feel some shortness of breath. She c/o incessant coughing productive of light yellow sputum. She denies fever, chills, nausea or body aches. She is unsure if she has had sick contacts. She does have a history of allergies and asthma. The symptoms are getting worse instead of better.  Review of Systems      Past Medical History  Diagnosis Date  . Hypertension   . Insomnia   . Hiatal hernia   . GERD (gastroesophageal reflux disease)     Current Outpatient Prescriptions  Medication Sig Dispense Refill  . albuterol (PROVENTIL HFA;VENTOLIN HFA) 108 (90 BASE) MCG/ACT inhaler Inhale 2 puffs into the lungs every 6 (six) hours as needed for wheezing.  1 Inhaler  0  . HYDROcodone-homatropine (HYCODAN) 5-1.5 MG/5ML syrup Take 5 mLs by mouth every 8 (eight) hours as needed for cough.  120 mL  0  . lansoprazole (PREVACID) 30 MG capsule TAKE 1 CAPSULE (30 MG TOTAL) BY MOUTH DAILY.  30 capsule  3  . sertraline (ZOLOFT) 25 MG tablet TAKE 1 TABLET (25 MG TOTAL) BY MOUTH DAILY.  90 tablet  0  . benzonatate (TESSALON) 100 MG capsule Take 1 capsule (100 mg total) by mouth 2 (two) times daily as needed for cough.  20 capsule  0  . levofloxacin (LEVAQUIN) 500 MG tablet Take 1 tablet (500 mg total) by mouth daily.  7 tablet  0   No current facility-administered medications for this visit.    No Known Allergies  Family History  Problem Relation Age of Onset  . Hypertension Mother   . Allergies Mother   . Asthma Mother   . Heart disease Neg Hx     History   Social History  . Marital Status: Married    Spouse Name: N/A    Number of Children: N/A  . Years  of Education: N/A   Occupational History  . Not on file.   Social History Main Topics  . Smoking status: Current Some Day Smoker  . Smokeless tobacco: Not on file  . Alcohol Use: Not on file  . Drug Use: No  . Sexually Active: Not on file   Other Topics Concern  . Not on file   Social History Narrative  . No narrative on file     Constitutional: Denies fever, malaise, fatigue, headache or abrupt weight changes.  HEENT: Denies eye pain, eye redness, ear pain, ringing in the ears, wax buildup, runny nose, nasal congestion, bloody nose, or sore throat. Respiratory: Pt reports shortness of breath and cough. Denies difficulty breathing.  Cardiovascular: Denies chest pain, chest tightness, palpitations or swelling in the hands or feet.  Gastrointestinal: Pt reports reflux. Denies abdominal pain, bloating, constipation, diarrhea or blood in the stool.     No other specific complaints in a complete review of systems (except as listed in HPI above).  Objective:   Physical Exam BP 142/86  Pulse 98  Temp(Src) 98.4 F (36.9 C) (Oral)  SpO2 95% Wt Readings from Last 3 Encounters:  10/02/11 145 lb (65.772 kg)  11/06/10 149 lb 6.1 oz (67.759 kg)  01/17/10 154 lb 12  oz (70.194 kg)    General: Appears her stated age, well developed, well nourished in NAD. Skin: Warm, dry and intact. No rashes, lesions or ulcerations noted. HEENT: Head: normal shape and size; Eyes: sclera white, no icterus, conjunctiva pink, PERRLA and EOMs intact; Ears: Tm's gray and intact, normal light reflex; Nose: mucosa pink and moist, septum midline; Throat/Mouth: Teeth present, mucosa pink and moist, no exudate, lesions or ulcerations noted.  Neck: Normal range of motion. Neck supple, trachea midline. No massses, lumps or thyromegaly present.  Cardiovascular: Normal rate and rhythm. S1,S2 noted.  No murmur, rubs or gallops noted. No JVD or BLE edema. No carotid bruits noted. Pulmonary/Chest: Normal effort and  bilateral expiratory wheeze and scattered rhonchi throughout. No respiratory distress.  Abdomen: Soft and nontender. Normal bowel sounds, no bruits noted. No distention or masses noted. Liver, spleen and kidneys non palpable. Psychiatric: Mood and affect normal. Behavior is normal. Judgment and thought content normal.           Assessment & Plan:   Acute bronchitis, unresolved:  Ibuprofen as needed if you have fever Drink plenty of fluids and get some rest Change antibiotic to Levaquin x 7 days Continue hycodan and albuterol Will get chest xray to r/o pneumonia  Smoking cessation, chronic:  Refill zoloft today

## 2013-03-17 NOTE — Patient Instructions (Signed)

## 2013-03-18 ENCOUNTER — Ambulatory Visit: Payer: BC Managed Care – PPO | Admitting: Internal Medicine

## 2013-06-04 ENCOUNTER — Ambulatory Visit (INDEPENDENT_AMBULATORY_CARE_PROVIDER_SITE_OTHER): Payer: BC Managed Care – PPO | Admitting: Internal Medicine

## 2013-06-04 ENCOUNTER — Encounter: Payer: Self-pay | Admitting: Internal Medicine

## 2013-06-04 VITALS — BP 140/80 | HR 80 | Temp 97.9°F | Wt 141.8 lb

## 2013-06-04 DIAGNOSIS — Z720 Tobacco use: Secondary | ICD-10-CM

## 2013-06-04 DIAGNOSIS — I10 Essential (primary) hypertension: Secondary | ICD-10-CM

## 2013-06-04 DIAGNOSIS — F17200 Nicotine dependence, unspecified, uncomplicated: Secondary | ICD-10-CM

## 2013-06-04 DIAGNOSIS — Z7189 Other specified counseling: Secondary | ICD-10-CM

## 2013-06-04 DIAGNOSIS — Z716 Tobacco abuse counseling: Secondary | ICD-10-CM

## 2013-06-04 MED ORDER — BENZONATATE 100 MG PO CAPS: 100.0000 mg | ORAL_CAPSULE | Freq: Three times a day (TID) | ORAL | Status: DC | PRN

## 2013-06-04 MED ORDER — SERTRALINE HCL 50 MG PO TABS: ORAL_TABLET | ORAL | Status: DC

## 2013-06-04 MED ORDER — SULFAMETHOXAZOLE-TMP DS 800-160 MG PO TABS: 1.0000 | ORAL_TABLET | Freq: Two times a day (BID) | ORAL | Status: DC

## 2013-06-04 NOTE — Patient Instructions (Addendum)
Smokers bronchitis w/o evidence of active bacterial infection.  Plan Advair discus - 1 inhalation AM and bedtime  Robitussin DM (generic) 1 tsp every 4 hours as needed for cough   Hycodan 1 tsp at bedtime  Tessalon perles 100 mg three times a day.  Rx for Septra DS twice a day for 7 days - fill the Rx if you develop fever or purulent mucus(colored, thick, bitter or metallic in taste)  Pulmonary - you have on x-ray signs of early emphysema! You are developing chronic Bronchitis - COPD  Plan  YOU MUST STOP SMOKING!!!!!  Increase the zoloft to 50 mg daily. Will power - see tip sheet  Pulmonary function testing - establish a baseline in regard to lung function - you will be call with appointment time.   Chronic Obstructive Pulmonary Disease Exacerbation Chronic obstructive pulmonary disease (COPD) is a condition that limits airflow. COPD may include chronic bronchitis, pulmonary emphysema, or both. COPD exacerbation means that your COPD has gotten worse. Without treatment, this can be a life-threatening problem. COPD exacerbation requires immediate medical care. CAUSES  COPD exacerbation can be caused by:  Exposure to smoke.  Exposure to air pollution, chemical fumes, or dust.  Respiratory infections.  Genetics, particularly alpha 1-antitrypsin deficiency.  A condition in which the body's immune system attacks itself (autoimmunity). SYMPTOMS   Increased coughing.  Increased wheezing.  Increased shortness of breath.  Swelling due to a buildup of fluid (peripheral edema) related to heart strain.  Rapid breathing.  Chest enlargement (barrel chest).  Chest tightness. DIAGNOSIS  There is no single test that can diagnosis COPD exacerbation. Your history, physical exam, and other tests will help your caregiver make a diagnosis. Tests may include a chest X-ray, pulmonary function tests, spirometry, basic lab tests, and an arterial blood gas test. TREATMENT  Severe problems may  require a stay in the hospital. Depending on the cause of your problems, the following may be prescribed:  Antibiotic medicines.  Bronchodilators (inhaled or tablets).  Cortisone medicines (inhaled or tablets).  Supplemental oxygen therapy.  Pulmonary rehabilitation. This is a broad program that may involve exercise, nutrition counseling, breathing techniques, and further education about your condition. It is important to use good technique with inhaled medicines. Spacer devices may be needed to help improve drug delivery. HOME CARE INSTRUCTIONS   Do not smoke. Quitting smoking is very important to prevent worsening of COPD.  Avoid exposure to all substances that irritate the airway, especially tobacco smoke.  If prescribed, take your antibiotics as directed. Finish them even if you start to feel better.  Only take over-the-counter or prescription medicines as directed by your caregiver.  Drink enough fluids to keep your urine clear or pale yellow. This can help thin bronchial secretions.  Use a cool mist vaporizer. This makes it easier to clear your chest when you cough.  If you have a home nebulizer and oxygen, continue to use them as directed.  Maintain all necessary vaccinations to prevent infections.  Exercise regularly.  Eat a healthy diet.  Keep all follow-up appointments as directed by your caregiver. SEEK IMMEDIATE MEDICAL CARE IF:  You have extreme shortness of breath.  You have trouble talking.  You have severe chest pain or blood in your sputum.  You have a high fever, weakness, repeated vomiting, or fainting.  You feel confused.  You keep getting worse. MAKE SURE YOU:   Understand these instructions.  Will watch your condition.  Will get help right away if you are  not doing well or get worse. Document Released: 08/12/2007 Document Revised: 01/07/2012 Document Reviewed: 06/12/2011 [facility] Patient Information 2014 [place], [place].    Smoking  Cessation, Tips for Success YOU CAN QUIT SMOKING If you are ready to quit smoking, congratulations! You have chosen to help yourself be healthier. Cigarettes bring nicotine, tar, carbon monoxide, and other irritants into your body. Your lungs, heart, and blood vessels will be able to work better without these poisons. There are many different ways to quit smoking. Nicotine gum, nicotine patches, a nicotine inhaler, or nicotine nasal spray can help with physical craving. Hypnosis, support groups, and medicines help break the habit of smoking. Here are some tips to help you quit for good.  Throw away all cigarettes.  Clean and remove all ashtrays from your home, work, and car.  On a card, write down your reasons for quitting. Carry the card with you and read it when you get the urge to smoke.  Cleanse your body of nicotine. Drink enough water and fluids to keep your urine clear or pale yellow. Do this after quitting to flush the nicotine from your body.  Learn to predict your moods. Do not let a bad situation be your excuse to have a cigarette. Some situations in your life might tempt you into wanting a cigarette.  Never have "just one" cigarette. It leads to wanting another and another. Remind yourself of your decision to quit.  Change habits associated with smoking. If you smoked while driving or when feeling stressed, try other activities to replace smoking. Stand up when drinking your coffee. Brush your teeth after eating. Sit in a different chair when you read the paper. Avoid alcohol while trying to quit, and try to drink fewer caffeinated beverages. Alcohol and caffeine may urge you to smoke.  Avoid foods and drinks that can trigger a desire to smoke, such as sugary or spicy foods and alcohol.  Ask people who smoke not to smoke around you.  Have something planned to do right after eating or having a cup of coffee. Take a walk or exercise to perk you up. This will help to keep you from  overeating.  Try a relaxation exercise to calm you down and decrease your stress. Remember, you may be tense and nervous for the first 2 weeks after you quit, but this will pass.  Find new activities to keep your hands busy. Play with a pen, coin, or rubber band. Doodle or draw things on paper.  Brush your teeth right after eating. This will help cut down on the craving for the taste of tobacco after meals. You can try mouthwash, too.  Use oral substitutes, such as lemon drops, carrots, a cinnamon stick, or chewing gum, in place of cigarettes. Keep them handy so they are available when you have the urge to smoke.  When you have the urge to smoke, try deep breathing.  Designate your home as a nonsmoking area.  If you are a heavy smoker, ask your caregiver about a prescription for nicotine chewing gum. It can ease your withdrawal from nicotine.  Reward yourself. Set aside the cigarette money you save and buy yourself something nice.  Look for support from others. Join a support group or smoking cessation program. Ask someone at home or at work to help you with your plan to quit smoking.  Always ask yourself, "Do I need this cigarette or is this just a reflex?" Tell yourself, "Today, I choose not to smoke," or "I do  not want to smoke." You are reminding yourself of your decision to quit, even if you do smoke a cigarette. HOW WILL I FEEL WHEN I QUIT SMOKING?  The benefits of not smoking start within days of quitting.  You may have symptoms of withdrawal because your body is used to nicotine (the addictive substance in cigarettes). You may crave cigarettes, be irritable, feel very hungry, cough often, get headaches, or have difficulty concentrating.  The withdrawal symptoms are only temporary. They are strongest when you first quit but will go away within 10 to 14 days.  When withdrawal symptoms occur, stay in control. Think about your reasons for quitting. Remind yourself that these are  signs that your body is healing and getting used to being without cigarettes.  Remember that withdrawal symptoms are easier to treat than the major diseases that smoking can cause.  Even after the withdrawal is over, expect periodic urges to smoke. However, these cravings are generally short-lived and will go away whether you smoke or not. Do not smoke!  If you relapse and smoke again, do not lose hope. Most smokers quit 3 times before they are successful.  If you relapse, do not give up! Plan ahead and think about what you will do the next time you get the urge to smoke. LIFE AS A NONSMOKER: MAKE IT FOR A MONTH, MAKE IT FOR LIFE Day 1: Hang this page where you will see it every day. Day 2: Get rid of all ashtrays, matches, and lighters. Day 3: Drink water. Breathe deeply between sips. Day 4: Avoid places with smoke-filled air, such as bars, clubs, or the smoking section of restaurants. Day 5: Keep track of how much money you save by not smoking. Day 6: Avoid boredom. Keep a good book with you or go to the movies. Day 7: Reward yourself! One week without smoking! Day 8: Make a dental appointment to get your teeth cleaned. Day 9: Decide how you will turn down a cigarette before it is offered to you. Day 10: Review your reasons for quitting. Day 11: Distract yourself. Stay active to keep your mind off smoking and to relieve tension. Take a walk, exercise, read a book, do a crossword puzzle, or try a new hobby. Day 12: Exercise. Get off the bus before your stop or use stairs instead of escalators. Day 13: Call on friends for support and encouragement. Day 14: Reward yourself! Two weeks without smoking! Day 15: Practice deep breathing exercises. Day 16: Bet a friend that you can stay a nonsmoker. Day 17: Ask to sit in nonsmoking sections of restaurants. Day 18: Hang up "No Smoking" signs. Day 19: Think of yourself as a nonsmoker. Day 20: Each morning, tell yourself you will not smoke. Day  21: Reward yourself! Three weeks without smoking! Day 22: Think of smoking in negative ways. Remember how it stains your teeth, gives you bad breath, and leaves you short of breath. Day 23: Eat a nutritious breakfast. Day 24:Do not relive your days as a smoker. Day 25: Hold a pencil in your hand when talking on the telephone. Day 26: Tell all your friends you do not smoke. Day 27: Think about how much better food tastes. Day 28: Remember, one cigarette is one too many. Day 29: Take up a hobby that will keep your hands busy. Day 30: Congratulations! One month without smoking! Give yourself a big reward. Your caregiver can direct you to community resources or hospitals for support, which may include:  Group support.  Education.  Hypnosis.  Subliminal therapy. Document Released: 07/13/2004 Document Revised: 01/07/2012 Document Reviewed: 08/01/2009 [facility] Patient Information 2014 [place], [place].

## 2013-06-04 NOTE — Progress Notes (Signed)
  Subjective:    Patient ID: Morgan Harper, female    DOB: 11-10-1955, 57 y.o.   MRN: [id number]  HPI Seen by Ms. Baity May 12 for bronchitis, treated with depomedrol and Z-pak. Returned may 20th with persistent symptoms - treated with levaquin. CXR - CHEST - 2 VIEW  Comparison: None.  Findings: There is a degree of underlying emphysematous change.  There is no edema or consolidation. The heart size is normal. The  pulmonary vascularity reflects underlying emphysema. No  adenopathy. No bone lesions.  IMPRESSION:  Underlying emphysematous change. No edema or consolidation.  She did well after second round of antibiotics. For the last week she has had a recurrent cough, productive of a white thickish mucus, no fever, mild increase in SOB. Tessalon perles have helped. She has not taken any mucenex   Review of Systems System review is negative for any constitutional, cardiac, pulmonary, GI or neuro symptoms or complaints other than as described in the HPI.     Objective:   Physical Exam Filed Vitals:   06/04/13 1112  BP: 140/80  Pulse: 80  Temp: 97.9 F (36.6 C)   O2 sat 95%  Gen'l- NWWD woman in no distress Cor- RRR Pulm - no increased WOB, Lungs w/o wheeze or rales.        Assessment & Plan:  Pulmonary - recurrent bronchitis and x-ray findings suggest COPD/emphysema  Plan Full PFTs

## 2013-06-06 NOTE — Assessment & Plan Note (Signed)
BP Readings from Last 3 Encounters:  06/04/13 140/80  03/17/13 142/86  03/09/13 168/98   Adequate control - but on the borderline.   Plan Home monitoring of BP with report back by MyChart

## 2013-06-06 NOTE — Assessment & Plan Note (Signed)
Discussed at length the need to stop in the face of chronic bronchitis and x-ry findings of early emphysema

## 2013-07-20 ENCOUNTER — Other Ambulatory Visit: Payer: Self-pay | Admitting: Internal Medicine

## 2013-07-23 ENCOUNTER — Ambulatory Visit: Payer: BC Managed Care – PPO | Admitting: Internal Medicine

## 2013-09-02 ENCOUNTER — Other Ambulatory Visit: Payer: Self-pay | Admitting: Internal Medicine

## 2013-09-03 ENCOUNTER — Other Ambulatory Visit: Payer: Self-pay

## 2013-09-18 ENCOUNTER — Other Ambulatory Visit: Payer: Self-pay | Admitting: Internal Medicine

## 2013-09-18 ENCOUNTER — Telehealth: Payer: Self-pay | Admitting: Internal Medicine

## 2013-09-18 ENCOUNTER — Encounter: Payer: Self-pay | Admitting: Internal Medicine

## 2013-09-18 MED ORDER — SULFAMETHOXAZOLE-TMP DS 800-160 MG PO TABS: 1.0000 | ORAL_TABLET | Freq: Two times a day (BID) | ORAL | Status: DC

## 2013-09-18 NOTE — Telephone Encounter (Signed)
Okay for refill on Bactrim DS??

## 2013-09-18 NOTE — Telephone Encounter (Signed)
Received MyChart message from her and did sent in septra for her.

## 2013-09-18 NOTE — Telephone Encounter (Signed)
I called and spoke to patient. She states she has a bad cough and coughing up green mucous. Bad head cold, bronchitis. She was advised by you a few months ago if this happens again to take the antibiotic.

## 2013-09-18 NOTE — Telephone Encounter (Signed)
Need to know what we are treating.

## 2013-10-10 ENCOUNTER — Other Ambulatory Visit: Payer: Self-pay | Admitting: Internal Medicine

## 2013-10-12 NOTE — Telephone Encounter (Signed)
MEN pt 

## 2013-10-25 ENCOUNTER — Other Ambulatory Visit: Payer: Self-pay | Admitting: Internal Medicine

## 2013-10-26 NOTE — Telephone Encounter (Signed)
MEN pt

## 2013-10-26 NOTE — Telephone Encounter (Signed)
Last OV with you was 03/09/13 when this medication was filled with 3 refills. Last seen by Dr Debby Bud on 06/04/2013--please advise

## 2013-11-02 ENCOUNTER — Other Ambulatory Visit: Payer: Self-pay

## 2013-11-02 DIAGNOSIS — K449 Diaphragmatic hernia without obstruction or gangrene: Secondary | ICD-10-CM

## 2013-11-02 MED ORDER — LANSOPRAZOLE 30 MG PO CPDR: DELAYED_RELEASE_CAPSULE | ORAL | Status: DC

## 2014-05-27 ENCOUNTER — Telehealth: Payer: Self-pay

## 2014-05-27 DIAGNOSIS — K449 Diaphragmatic hernia without obstruction or gangrene: Secondary | ICD-10-CM

## 2014-05-27 MED ORDER — LANSOPRAZOLE 30 MG PO CPDR: DELAYED_RELEASE_CAPSULE | ORAL | Status: DC

## 2014-05-27 NOTE — Telephone Encounter (Signed)
OK #30 OV needed for refills Last seen 8/14

## 2014-05-27 NOTE — Telephone Encounter (Signed)
Former pt of Dr Debby BudNorins Last seen 06/04/13 No up coming appointment Okay to refill under your name?

## 2014-07-02 ENCOUNTER — Other Ambulatory Visit: Payer: Self-pay | Admitting: Internal Medicine

## 2014-07-06 NOTE — Telephone Encounter (Signed)
Former patient of Dr Debby Bud  No up coming appoinments Okay for 30 days?

## 2014-07-06 NOTE — Telephone Encounter (Signed)
#  30 Last seen 8/14 Needs new PCP

## 2014-07-27 ENCOUNTER — Other Ambulatory Visit (INDEPENDENT_AMBULATORY_CARE_PROVIDER_SITE_OTHER): Payer: BC Managed Care – PPO

## 2014-07-27 ENCOUNTER — Ambulatory Visit (INDEPENDENT_AMBULATORY_CARE_PROVIDER_SITE_OTHER): Payer: BC Managed Care – PPO | Admitting: Internal Medicine

## 2014-07-27 ENCOUNTER — Telehealth: Payer: Self-pay

## 2014-07-27 ENCOUNTER — Encounter: Payer: Self-pay | Admitting: Internal Medicine

## 2014-07-27 ENCOUNTER — Other Ambulatory Visit: Payer: Self-pay | Admitting: Internal Medicine

## 2014-07-27 VITALS — BP 178/100 | HR 85 | Temp 98.3°F | Wt 140.4 lb

## 2014-07-27 DIAGNOSIS — K219 Gastro-esophageal reflux disease without esophagitis: Secondary | ICD-10-CM

## 2014-07-27 DIAGNOSIS — Z1211 Encounter for screening for malignant neoplasm of colon: Secondary | ICD-10-CM

## 2014-07-27 DIAGNOSIS — E162 Hypoglycemia, unspecified: Secondary | ICD-10-CM

## 2014-07-27 DIAGNOSIS — R030 Elevated blood-pressure reading, without diagnosis of hypertension: Secondary | ICD-10-CM

## 2014-07-27 DIAGNOSIS — F411 Generalized anxiety disorder: Secondary | ICD-10-CM

## 2014-07-27 LAB — BASIC METABOLIC PANEL
BUN: 21 mg/dL (ref 6–23)
CO2: 28 mEq/L (ref 19–32)
CREATININE: 0.8 mg/dL (ref 0.4–1.2)
Calcium: 10.1 mg/dL (ref 8.4–10.5)
Chloride: 104 mEq/L (ref 96–112)
GFR: 82.59 mL/min (ref >60.00)
GLUCOSE: 27 mg/dL — AB (ref 70–99)
POTASSIUM: 4.2 meq/L (ref 3.5–5.1)
Sodium: 141 mEq/L (ref 135–145)

## 2014-07-27 MED ORDER — SERTRALINE HCL 50 MG PO TABS: ORAL_TABLET | ORAL | Status: DC

## 2014-07-27 MED ORDER — LANSOPRAZOLE 30 MG PO CPDR: DELAYED_RELEASE_CAPSULE | ORAL | Status: DC

## 2014-07-27 NOTE — Progress Notes (Signed)
Pre visit review using our clinic review tool, if applicable. No additional management support is needed unless otherwise documented below in the visit note. 

## 2014-07-27 NOTE — Telephone Encounter (Signed)
LVM for pt to call back   RE: glucose is critical low, Dr. Alwyn RenHopper has added another test. Needs to be done in the morning, it is a fasting test. No solid foods after midnight. If pt must have coffee in the morning then only black coffee, no cream or sugar.

## 2014-07-27 NOTE — Telephone Encounter (Signed)
Pt stated that she will go to the lab in the morning,

## 2014-07-27 NOTE — Progress Notes (Signed)
   Subjective:    Patient ID: Morgan Harper, female    DOB: 05/31/1956, 58 y.o.   MRN: [id number]  HPI   She is here for med refills of her PPI and her serotonin reuptake inhibitor  Despite her blood pressure today & with a repeat measurement of 150/100; she denies a history of hypertension or treatment for such. She does state that the drive to the office was somewhat "harrowing" because of the rain & heavy traffic. There is no family history of stroke or heart attack.  Her hiatal hernia and GERD symptoms are controlled completely with a PPI. Her last upper endoscopy was in 2008 she believes. Probable triggers at this time include eating spicy food late at night. She does not ingest caffeine or drink alcohol. She does smoke one pack per day or less. She's never had a colonoscopy. Apparently this was because of her insurance companiy's coverage would result in a  $1400-1600 cost to her.  The sertraline is of very great value in allowing her to focus & in controlling stress. 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 Her husband is out of work.    Review of Systems  Unexplained weight loss, abdominal pain, significant dyspepsia, dysphagia, melena, rectal bleeding, or persistently small caliber stools are denied.  Chest pain, palpitations, tachycardia, exertional dyspnea, paroxysmal nocturnal dyspnea, claudication or edema are absent.  No significant change in weight; significant fatigue; sleep disorder; change in appetite. No anxiety; depression; panic attacks           Objective:   Physical Exam   Positive pertinent findings include: She has an upper dental plate. Breath sounds are generally decreased in all lung fields without increased work of breathing. She has an S4; as noted blood pressure recheck was 150/100. There is suggestion of early clubbing of the  nailbeds  General appearance :adequately nourished; in no distress. Eyes: No conjunctival inflammation or scleral icterus is present. Oral exam: Lips and gums are healthy appearing.There is no oropharyngeal erythema or exudate noted.  Heart:  Normal rate and regular rhythm. S1 and S2 normal without gallop, murmur, click, rub or other extra sounds   Lungs: no wheezes, rhonchi,rales ,or rubs present Abdomen: bowel sounds normal, soft and non-tender without masses, organomegaly or hernias noted.  No guarding or rebound. Vascular : all pulses equal ; no bruits present. Skin:Warm & dry.  Intact without suspicious lesions or rashes ; no jaundice or tenting Lymphatic: No lymphadenopathy is noted about the head, neck, axilla             Assessment & Plan:  #1 hiatal hernia/reflux; excellent control  #2 anxiety/stress. Excellent response to sertraline  #3 elevated blood pressure without diagnosis of hypertension  Plan: See orders and recommendations. If blood pressure average is greater than 140/90 with monitor; she will start Losartan 100 milligrams daily . BMET ordered..  Colonoscopy referral initiated.

## 2014-07-27 NOTE — Patient Instructions (Signed)
Minimal Blood Pressure Goal= AVERAGE < 140/90;  Ideal is an AVERAGE < 135/85. This AVERAGE should be calculated from @ least 5-7 BP readings taken @ different times of day on different days of week. You should not respond to isolated BP readings , but rather the AVERAGE for that week .Please bring your  blood pressure cuff to office visits to verify that it is reliable.It  can also be checked against the blood pressure device at the pharmacy. Finger or wrist cuffs are not dependable; an arm cuff is.Fill the  prescription for the BP medication if BP NOT @ goal based on  7 to 14 day average. 

## 2014-07-28 ENCOUNTER — Other Ambulatory Visit (INDEPENDENT_AMBULATORY_CARE_PROVIDER_SITE_OTHER): Payer: BC Managed Care – PPO

## 2014-07-28 DIAGNOSIS — E162 Hypoglycemia, unspecified: Secondary | ICD-10-CM

## 2014-07-28 LAB — GLUCOSE, RANDOM: Glucose, Bld: 78 mg/dL (ref 70–99)

## 2014-07-29 LAB — INSULIN, FASTING: Insulin fasting, serum: 5.2 u[IU]/mL (ref 2.0–19.6)

## 2014-08-18 ENCOUNTER — Encounter: Payer: Self-pay | Admitting: Internal Medicine

## 2014-09-22 ENCOUNTER — Ambulatory Visit (INDEPENDENT_AMBULATORY_CARE_PROVIDER_SITE_OTHER): Payer: BC Managed Care – PPO | Admitting: Emergency Medicine

## 2014-09-22 VITALS — BP 162/86 | HR 102 | Temp 98.0°F | Resp 18 | Ht 62.0 in | Wt 142.0 lb

## 2014-09-22 DIAGNOSIS — J209 Acute bronchitis, unspecified: Secondary | ICD-10-CM

## 2014-09-22 DIAGNOSIS — R05 Cough: Secondary | ICD-10-CM

## 2014-09-22 DIAGNOSIS — R059 Cough, unspecified: Secondary | ICD-10-CM

## 2014-09-22 DIAGNOSIS — J40 Bronchitis, not specified as acute or chronic: Secondary | ICD-10-CM

## 2014-09-22 MED ORDER — HYDROCOD POLST-CHLORPHEN POLST 10-8 MG/5ML PO LQCR: 5.0000 mL | Freq: Two times a day (BID) | ORAL | Status: DC | PRN

## 2014-09-22 MED ORDER — IPRATROPIUM BROMIDE 0.02 % IN SOLN
0.5000 mg | Freq: Once | RESPIRATORY_TRACT | Status: AC
  Administered 2014-09-22: 0.5 mg via RESPIRATORY_TRACT

## 2014-09-22 MED ORDER — ALBUTEROL SULFATE HFA 108 (90 BASE) MCG/ACT IN AERS: 2.0000 | INHALATION_SPRAY | RESPIRATORY_TRACT | Status: DC | PRN

## 2014-09-22 MED ORDER — ALBUTEROL SULFATE (2.5 MG/3ML) 0.083% IN NEBU
5.0000 mg | INHALATION_SOLUTION | Freq: Once | RESPIRATORY_TRACT | Status: AC
  Administered 2014-09-22: 5 mg via RESPIRATORY_TRACT

## 2014-09-22 MED ORDER — AZITHROMYCIN 250 MG PO TABS: ORAL_TABLET | ORAL | Status: DC

## 2014-09-22 NOTE — Patient Instructions (Signed)
Metered Dose Inhaler (No Spacer Used)  Inhaled medicines are the basis of treatment for asthma and other breathing problems. Inhaled medicine can only be effective if used properly. Good technique assures that the medicine reaches the lungs.  Metered dose inhalers (MDIs) are used to deliver a variety of inhaled medicines. These include quick relief or rescue medicines (such as bronchodilators) and controller medicines (such as corticosteroids). The medicine is delivered by pushing down on a metal canister to release a set amount of spray.  If you are using different kinds of inhalers, use your quick relief medicine to open the airways 10-15 minutes before using a steroid, if instructed to do so by your health care provider. If you are unsure which inhalers to use and the order of using them, ask your health care provider, nurse, or respiratory therapist.  HOW TO USE THE INHALER  1. Remove the cap from the inhaler.  2. If you are using the inhaler for the first time, you will need to prime it. Shake the inhaler for 5 seconds and release four puffs into the air, away from your face. Ask your health care provider or pharmacist if you have questions about priming your inhaler.  3. Shake the inhaler for 5 seconds before each breath in (inhalation).  4. Position the inhaler so that the top of the canister faces up.  5. Put your index finger on the top of the medicine canister. Your thumb supports the bottom of the inhaler.  6. Open your mouth.  7. Either place the inhaler between your teeth and place your lips tightly around the mouthpiece, or hold the inhaler 1-2 inches away from your open mouth. If you are unsure of which technique to use, ask your health care provider.  8. Breathe out (exhale) normally and as completely as possible.  9. Press the canister down with the index finger to release the medicine.  10. At the same time as the canister is pressed, inhale deeply and slowly until your lungs are completely filled.  This should take 4-6 seconds. Keep your tongue down.  11. Hold the medicine in your lungs for 5-10 seconds (10 seconds is best). This helps the medicine get into the small airways of your lungs.  12. Breathe out slowly, through pursed lips. Whistling is an example of pursed lips.  13. Wait at least 1 minute between puffs. Continue with the above steps until you have taken the number of puffs your health care provider has ordered. Do not use the inhaler more than your health care provider directs you to.  14. Replace the cap on the inhaler.  15. Follow the directions from your health care provider or the inhaler insert for cleaning the inhaler.  If you are using a steroid inhaler, after your last puff, rinse your mouth with water, gargle, and spit out the water. Do not swallow the water.  AVOID:  · Inhaling before or after starting the spray of medicine. It takes practice to coordinate your breathing with triggering the spray.  · Inhaling through the nose (rather than the mouth) when triggering the spray.  HOW TO DETERMINE IF YOUR INHALER IS FULL OR NEARLY EMPTY  You cannot know when an inhaler is empty by shaking it. Some inhalers are now being made with dose counters. Ask your health care provider for a prescription that has a dose counter if you feel you need that extra help. If your inhaler does not have a counter, ask your health care   provider to help you determine the date you need to refill your inhaler. Write the refill date on a calendar or your inhaler canister. Refill your inhaler 7-10 days before it runs out. Be sure to keep an adequate supply of medicine. This includes making sure it has not expired, and making sure you have a spare inhaler.  SEEK MEDICAL CARE IF:  · Symptoms are only partially relieved with your inhaler.  · You are having trouble using your inhaler.  · You experience an increase in phlegm.  SEEK IMMEDIATE MEDICAL CARE IF:  · You feel little or no relief with your inhalers. You are still  wheezing and feeling shortness of breath, tightness in your chest, or both.  · You have dizziness, headaches, or a fast heart rate.  · You have chills, fever, or night sweats.  · There is a noticeable increase in phlegm production, or there is blood in the phlegm.  MAKE SURE YOU:  · Understand these instructions.  · Will watch your condition.  · Will get help right away if you are not doing well or get worse.  Document Released: 08/12/2007 Document Revised: 03/01/2014 Document Reviewed: 04/02/2013  [employer]® Patient Information ©2015 [employer]. This information is not intended to replace advice given to you by your health care provider. Make sure you discuss any questions you have with your health care provider.

## 2014-09-22 NOTE — Progress Notes (Signed)
[facility] [address], [place] [id number] [phone]  Date:  09/22/2014   Name:  Morgan Harper   DOB:  10-22-1956   MRN:  [id number]  PCP:  Illene RegulusMichael Norins, MD    Chief Complaint: Cough   History of Present Illness:  Morgan Harper is a 58 y.o. very pleasant female patient who presents with the following:  Sick since Sunday with productive cough with purulent sputum.  Took left over antibiotics with no improvement Wheezing and short of breath with exertion. No coryza. Had a fever early in the week. Now resolved Fatigued and worse cough No nausea or vomiting.  No improvement with over the counter medications or other home remedies.  Denies other complaint or health concern today.   Patient Active Problem List   Diagnosis Date Noted  . Tobacco abuse 06/04/2013  . BACK PAIN 11/06/2010  . PES ANSERINUS TENDINITIS OR BURSITIS 01/17/2010  . UNSPECIFIED ESSENTIAL HYPERTENSION 11/06/2008  . HEADACHE, TENSION 07/28/2008  . GERD 03/06/2008  . HIATAL HERNIA, HX OF 03/06/2008  . URTICARIA, HX OF 03/06/2008  . INSOMNIA, HX OF 03/06/2008    Past Medical History  Diagnosis Date  . Hypertension   . Insomnia   . Hiatal hernia   . GERD (gastroesophageal reflux disease)     Past Surgical History  Procedure Laterality Date  . Tonsillectomy      History  Substance Use Topics  . Smoking status: Current Every Day Smoker -- 0.50 packs/day for 30 years    Types: Cigarettes  . Smokeless tobacco: Not on file  . Alcohol Use: Not on file    Family History  Problem Relation Age of Onset  . Hypertension Mother   . Allergies Mother   . Asthma Mother   . Heart disease Neg Hx   . Diabetes Sister     No Known Allergies  Medication list has been reviewed and updated.  Current Outpatient Prescriptions on File Prior to Visit  Medication Sig Dispense Refill  . lansoprazole (PREVACID) 30 MG capsule TAKE 1 CAPSULE (30 MG TOTAL) BY MOUTH DAILY. 90 capsule  1  . sertraline (ZOLOFT) 50 MG tablet TAKE 1 TABLET (50 MG TOTAL) BY MOUTH DAILY. 90 tablet 1  . losartan (COZAAR) 100 MG tablet Take 1 tablet (100 mg total) by mouth daily. 30 tablet 2   No current facility-administered medications on file prior to visit.    Review of Systems:  As per HPI, otherwise negative.    Physical Examination: Filed Vitals:   09/22/14 1454  BP: 162/86  Pulse: 102  Temp: 98 F (36.7 C)  Resp: 18   Filed Vitals:   09/22/14 1454  Height: 5\' 2"  (1.575 m)  Weight: 142 lb (64.411 kg)   Body mass index is 25.97 kg/(m^2). Ideal Body Weight: Weight in (lb) to have BMI = 25: 136.4  GEN: WDWN, NAD, Non-toxic, A & O x 3 HEENT: Atraumatic, Normocephalic. Neck supple. No masses, No LAD. Ears and Nose: No external deformity. CV: RRR, No M/G/R. No JVD. No thrill. No extra heart sounds. PULM: diffuse wheezes, no crackles, rhonchi. No retractions. No resp. distress. No accessory muscle use. ABD: S, NT, ND, +BS. No rebound. No HSM. EXTR: No c/c/e NEURO Normal gait.  PSYCH: Normally interactive. Conversant. Not depressed or anxious appearing.  Calm demeanor.    Assessment and Plan: Neb Bronchitis bronchospasm Albuterol MDI zpak tussionex  Signed,  Phillips OdorJeffery Anderson, MD

## 2014-09-30 ENCOUNTER — Ambulatory Visit (INDEPENDENT_AMBULATORY_CARE_PROVIDER_SITE_OTHER): Payer: BC Managed Care – PPO | Admitting: Internal Medicine

## 2014-09-30 ENCOUNTER — Encounter: Payer: Self-pay | Admitting: Internal Medicine

## 2014-09-30 ENCOUNTER — Other Ambulatory Visit (INDEPENDENT_AMBULATORY_CARE_PROVIDER_SITE_OTHER): Payer: BC Managed Care – PPO

## 2014-09-30 VITALS — BP 148/76 | HR 88 | Temp 97.9°F | Resp 18 | Ht 61.0 in | Wt 141.8 lb

## 2014-09-30 DIAGNOSIS — Z0000 Encounter for general adult medical examination without abnormal findings: Secondary | ICD-10-CM

## 2014-09-30 DIAGNOSIS — K219 Gastro-esophageal reflux disease without esophagitis: Secondary | ICD-10-CM

## 2014-09-30 DIAGNOSIS — I10 Essential (primary) hypertension: Secondary | ICD-10-CM

## 2014-09-30 DIAGNOSIS — Z720 Tobacco use: Secondary | ICD-10-CM

## 2014-09-30 LAB — BASIC METABOLIC PANEL
BUN: 13 mg/dL (ref 6–23)
CO2: 28 mEq/L (ref 19–32)
Calcium: 9.4 mg/dL (ref 8.4–10.5)
Chloride: 104 mEq/L (ref 96–112)
Creatinine, Ser: 0.7 mg/dL (ref 0.4–1.2)
GFR: 85.12 mL/min (ref >60.00)
Glucose, Bld: 65 mg/dL — ABNORMAL LOW (ref 70–99)
Potassium: 3.7 mEq/L (ref 3.5–5.1)
SODIUM: 140 meq/L (ref 135–145)

## 2014-09-30 LAB — LIPID PANEL
CHOL/HDL RATIO: 4
Cholesterol: 190 mg/dL (ref 0–200)
HDL: 46 mg/dL (ref >39.00)
LDL CALC: 125 mg/dL — AB (ref 0–99)
NONHDL: 144
Triglycerides: 96 mg/dL (ref 0.0–149.0)
VLDL: 19.2 mg/dL (ref 0.0–40.0)

## 2014-09-30 MED ORDER — BENZONATATE 100 MG PO CAPS: 100.0000 mg | ORAL_CAPSULE | Freq: Two times a day (BID) | ORAL | Status: DC | PRN

## 2014-09-30 NOTE — Assessment & Plan Note (Signed)
Patient agrees to try colo-guard as long as it is covered by her insurance. She declines flu shot as she is sick. She is reminded she needs to go back for her Pap smear and to schedule a mammogram.

## 2014-09-30 NOTE — Patient Instructions (Signed)
We will go and check your blood work today. We will call you back with the results.  [facility].  If you're doing well and not having any problems we'll see her back next year for your physical. If you have any problems or questions please feel free to call our office sooner.  It may be worthwhile to go back to your OB/GYN to get a Pap smear and remember to schedule your mammogram.  Smoking Cessation, Tips for Success If you are ready to quit smoking, congratulations! You have chosen to help yourself be healthier. Cigarettes bring nicotine, tar, carbon monoxide, and other irritants into your body. Your lungs, heart, and blood vessels will be able to work better without these poisons. There are many different ways to quit smoking. Nicotine gum, nicotine patches, a nicotine inhaler, or nicotine nasal spray can help with physical craving. Hypnosis, support groups, and medicines help break the habit of smoking. WHAT THINGS CAN I DO TO MAKE QUITTING EASIER?  Here are some tips to help you quit for good:  Pick a date when you will quit smoking completely. Tell all of your friends and family about your plan to quit on that date.  Do not try to slowly cut down on the number of cigarettes you are smoking. Pick a quit date and quit smoking completely starting on that day.  Throw away all cigarettes.   Clean and remove all ashtrays from your home, work, and car.  On a card, write down your reasons for quitting. Carry the card with you and read it when you get the urge to smoke.  Cleanse your body of nicotine. Drink enough water and fluids to keep your urine clear or pale yellow. Do this after quitting to flush the nicotine from your body.  Learn to predict your moods. Do not let a bad situation be your excuse to have a cigarette. Some situations in your life might tempt you into wanting a cigarette.  Never have "just one" cigarette. It leads to wanting another and  another. Remind yourself of your decision to quit.  Change habits associated with smoking. If you smoked while driving or when feeling stressed, try other activities to replace smoking. Stand up when drinking your coffee. Brush your teeth after eating. Sit in a different chair when you read the paper. Avoid alcohol while trying to quit, and try to drink fewer caffeinated beverages. Alcohol and caffeine may urge you to smoke.  Avoid foods and drinks that can trigger a desire to smoke, such as sugary or spicy foods and alcohol.  Ask people who smoke not to smoke around you.  Have something planned to do right after eating or having a cup of coffee. For example, plan to take a walk or exercise.  Try a relaxation exercise to calm you down and decrease your stress. Remember, you may be tense and nervous for the first 2 weeks after you quit, but this will pass.  Find new activities to keep your hands busy. Play with a pen, coin, or rubber band. Doodle or draw things on paper.  Brush your teeth right after eating. This will help cut down on the craving for the taste of tobacco after meals. You can also try mouthwash.   Use oral substitutes in place of cigarettes. Try using lemon drops, carrots, cinnamon sticks, or chewing gum. Keep them handy so they are available when you have the urge to smoke.  When you  have the urge to smoke, try deep breathing.  Designate your home as a nonsmoking area.  If you are a heavy smoker, ask your health care provider about a prescription for nicotine chewing gum. It can ease your withdrawal from nicotine.  Reward yourself. Set aside the cigarette money you save and buy yourself something nice.  Look for support from others. Join a support group or smoking cessation program. Ask someone at home or at work to help you with your plan to quit smoking.  Always ask yourself, "Do I need this cigarette or is this just a reflex?" Tell yourself, "Today, I choose not to  smoke," or "I do not want to smoke." You are reminding yourself of your decision to quit.  Do not replace cigarette smoking with electronic cigarettes (commonly called e-cigarettes). The safety of e-cigarettes is unknown, and some may contain harmful chemicals.  If you relapse, do not give up! Plan ahead and think about what you will do the next time you get the urge to smoke. HOW WILL I FEEL WHEN I QUIT SMOKING? You may have symptoms of withdrawal because your body is used to nicotine (the addictive substance in cigarettes). You may crave cigarettes, be irritable, feel very hungry, cough often, get headaches, or have difficulty concentrating. The withdrawal symptoms are only temporary. They are strongest when you first quit but will go away within 10-14 days. When withdrawal symptoms occur, stay in control. Think about your reasons for quitting. Remind yourself that these are signs that your body is healing and getting used to being without cigarettes. Remember that withdrawal symptoms are easier to treat than the major diseases that smoking can cause.  Even after the withdrawal is over, expect periodic urges to smoke. However, these cravings are generally short lived and will go away whether you smoke or not. Do not smoke! WHAT RESOURCES ARE AVAILABLE TO HELP ME QUIT SMOKING? Your health care provider can direct you to community resources or hospitals for support, which may include:  Group support.  Education.  Hypnosis.  Therapy. Document Released: 07/13/2004 Document Revised: 03/01/2014 Document Reviewed: 04/02/2013 [facility] Patient Information 2015 [place], [place]. This information is not intended to replace advice given to you by your health care provider. Make sure you discuss any questions you have with your health care provider.

## 2014-09-30 NOTE — Assessment & Plan Note (Signed)
Patient not taking the losartan on her list. She is currently taking decongestants which may have elevated her blood pressure. We will continue to monitor and may need to start therapy at next visit.

## 2014-09-30 NOTE — Assessment & Plan Note (Signed)
Patient currently nonsmoking given her bronchitis. Advised her this may be a good time to quit altogether. She states she is working on it but unsure if she will have success.

## 2014-09-30 NOTE — Assessment & Plan Note (Signed)
Her hiatal hernia is worsening her GERD. She does take Prevacid which provides full relief.

## 2014-09-30 NOTE — Progress Notes (Signed)
   Subjective:    Patient ID: Morgan Harper, female    DOB: [birth year], 58 y.o.   MRN: [id number]  HPI The patient is a 58 year old female who comes in today to establish care. She does have past medical history of GERD, tobacco abuse. She was sick last week and seen and taken azithromycin. She is mostly better but still having residual cough. She denies any chest pain, abdominal pain, diarrhea, constipation. She is under a little bit of extra stress at home as her niece and 59-year-old daughter have just moved in with her.  Review of Systems  Constitutional: Negative for fever, activity change, appetite change, fatigue and unexpected weight change.  HENT: Negative.  Negative for congestion, sinus pressure, sore throat and voice change.   Eyes: Negative.   Respiratory: Positive for cough. Negative for chest tightness, shortness of breath and wheezing.   Cardiovascular: Negative for chest pain, palpitations and leg swelling.  Gastrointestinal: Negative for nausea, abdominal pain, diarrhea, constipation and abdominal distention.  Musculoskeletal: Negative for myalgias, back pain and arthralgias.  Skin: Negative.   Neurological: Negative.   Psychiatric/Behavioral: Negative.       Objective:   Physical Exam  Constitutional: She is oriented to person, place, and time. She appears well-developed and well-nourished.  HENT:  Head: Normocephalic and atraumatic.  Eyes: EOM are normal.  Neck: Normal range of motion. Neck supple.  Cardiovascular:  Murmur heard. Pulmonary/Chest: Effort normal and breath sounds normal. No respiratory distress. She has no wheezes. She has no rales.  Abdominal: Soft. Bowel sounds are normal. She exhibits no distension. There is no tenderness. There is no rebound.  Musculoskeletal: She exhibits no edema.  Neurological: She is alert and oriented to person, place, and time. Coordination normal.  Skin: Skin is warm and dry.   Filed Vitals:   09/30/14 0834  BP: 148/76   Pulse: 88  Temp: 97.9 F (36.6 C)  TempSrc: Oral  Resp: 18  Height: 5\' 1"  (1.549 m)  Weight: 141 lb 12.8 oz (64.32 kg)  SpO2: 97%      Assessment & Plan:

## 2014-09-30 NOTE — Progress Notes (Signed)
Pre visit review using our clinic review tool, if applicable. No additional management support is needed unless otherwise documented below in the visit note. 

## 2014-10-11 ENCOUNTER — Ambulatory Visit (INDEPENDENT_AMBULATORY_CARE_PROVIDER_SITE_OTHER)
Admission: RE | Admit: 2014-10-11 | Discharge: 2014-10-11 | Disposition: A | Discharge status: Home / Self Care | Payer: BC Managed Care – PPO | Source: Ambulatory Visit | Attending: Internal Medicine | Admitting: Internal Medicine

## 2014-10-11 ENCOUNTER — Ambulatory Visit (INDEPENDENT_AMBULATORY_CARE_PROVIDER_SITE_OTHER): Payer: BC Managed Care – PPO | Admitting: Internal Medicine

## 2014-10-11 ENCOUNTER — Encounter: Payer: Self-pay | Admitting: Internal Medicine

## 2014-10-11 VITALS — BP 160/82 | HR 90 | Temp 98.1°F | Wt 137.4 lb

## 2014-10-11 DIAGNOSIS — J441 Chronic obstructive pulmonary disease with (acute) exacerbation: Secondary | ICD-10-CM

## 2014-10-11 MED ORDER — DOXYCYCLINE HYCLATE 100 MG PO TABS: 100.0000 mg | ORAL_TABLET | Freq: Two times a day (BID) | ORAL | Status: DC

## 2014-10-11 MED ORDER — HYDROCODONE-HOMATROPINE 5-1.5 MG/5ML PO SYRP: 5.0000 mL | ORAL_SOLUTION | Freq: Four times a day (QID) | ORAL | Status: DC | PRN

## 2014-10-11 MED ORDER — PREDNISONE 20 MG PO TABS: 20.0000 mg | ORAL_TABLET | Freq: Two times a day (BID) | ORAL | Status: DC

## 2014-10-11 NOTE — Progress Notes (Signed)
   Subjective:    Patient ID: Morgan Harper, female    DOB: 05/14/1956, 58 y.o.   MRN: [id number]  HPI   As of 10/10/14 she's had an exacerbation of her chronic cough  Symptoms actually began the Wednesday before Thanksgiving as severe asthmatic bronchitis. She was seen at the urgent care and given a Z-Pak and codeine cough syrup. She also was given a nebulized treatment as well as a prescription for albuterol.  Since yesterday she's been using albuterol up to every 3 hours  Last week she saw her primary care physician who also gave her some Tessalon Perles for some residual cough. After treatment @ the urgent care she ha actually improved 80% until yesterday  She continues to smoke a pack a day.  She has shortness of breath and wheezing. She is short of breath trying to climb one flight of stairs in her home  She has no upper respiratory tract symptoms   Review of Systems Frontal headache, facial pain , nasal purulence, dental pain, sore throat , otic pain or otic discharge denied. No fever , chills or sweats.     Objective:   Physical Exam  Pertinent or positive findings include: She has an upper dental plate. There is increased AP diameter of the chest. Her chest appears to be fixed in inspiration She has coarse rhonchi, rales, & wheezes in all lung fields Heart sounds are somewhat distant; she has an S4 .  General appearance:well nourished; no acute distress present.  No  lymphadenopathy about the head, neck, or axilla noted.  Eyes: No conjunctival inflammation or lid edema is present. There is no scleral icterus. Ears:  External ear exam shows no significant lesions or deformities.  Otoscopic examination reveals clear canals, tympanic membranes are intact bilaterally without bulging, retraction, inflammation or discharge. Nose:  External nasal examination shows no deformity or inflammation. Nasal mucosa are pink and moist without lesions or exudates. No septal dislocation  or deviation.No obstruction to airflow.  Oral exam: lips and gums are healthy appearing.There is no oropharyngeal erythema or exudate noted.  Neck:  No deformities, thyromegaly, masses, or tenderness noted.   Supple with full range of motion without pain.  Heart:  Normal rate and regular rhythm. S1 and S2 normal without gallop, murmur, click,or rub  Extremities:  No cyanosis, edema, or clubbing  noted  Skin: Warm & dry w/o jaundice or tenting.        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

## 2014-10-11 NOTE — Progress Notes (Signed)
Pre visit review using our clinic review tool, if applicable. No additional management support is needed unless otherwise documented below in the visit note. 

## 2014-10-11 NOTE — Patient Instructions (Signed)
Anoro  one inhalation daily; gargle and spit after use. Lot #: [id number] Exp 02/17   Albuterol is a rescue inhaler which should be used as infrequently as possible; it should never be used more than 1-2 puffs every 4 hours. Adverse effects from excess albuterol use can include health or life threatening heart rhythm irregularities. Lenox Ahr.  Fill the  prescription for Prednisone it there is not dramatic improvement in the next 48 hours.  Please consider using the E cigarette as we discussed. This could possibly decrease inflammation of the airways

## 2014-10-21 ENCOUNTER — Ambulatory Visit (INDEPENDENT_AMBULATORY_CARE_PROVIDER_SITE_OTHER): Payer: BC Managed Care – PPO | Admitting: Family Medicine

## 2014-10-21 ENCOUNTER — Encounter: Payer: Self-pay | Admitting: Family Medicine

## 2014-10-21 VITALS — BP 155/71 | HR 96 | Temp 98.2°F | Ht 61.0 in | Wt 138.2 lb

## 2014-10-21 DIAGNOSIS — K219 Gastro-esophageal reflux disease without esophagitis: Secondary | ICD-10-CM

## 2014-10-21 DIAGNOSIS — I10 Essential (primary) hypertension: Secondary | ICD-10-CM

## 2014-10-21 DIAGNOSIS — J209 Acute bronchitis, unspecified: Secondary | ICD-10-CM

## 2014-10-21 DIAGNOSIS — Z720 Tobacco use: Secondary | ICD-10-CM

## 2014-10-21 MED ORDER — BECLOMETHASONE DIPROPIONATE 80 MCG/ACT IN AERS: 2.0000 | INHALATION_SPRAY | Freq: Two times a day (BID) | RESPIRATORY_TRACT | Status: DC

## 2014-10-21 MED ORDER — BENZONATATE 200 MG PO CAPS: 200.0000 mg | ORAL_CAPSULE | Freq: Two times a day (BID) | ORAL | Status: DC | PRN

## 2014-10-21 MED ORDER — ALBUTEROL SULFATE HFA 108 (90 BASE) MCG/ACT IN AERS: 2.0000 | INHALATION_SPRAY | RESPIRATORY_TRACT | Status: DC | PRN

## 2014-10-21 MED ORDER — PREDNISONE 5 MG PO TABS: ORAL_TABLET | ORAL | Status: DC

## 2014-10-21 MED ORDER — CEFDINIR 300 MG PO CAPS: 300.0000 mg | ORAL_CAPSULE | Freq: Two times a day (BID) | ORAL | Status: AC

## 2014-10-21 MED ORDER — PREDNISONE 20 MG PO TABS: ORAL_TABLET | ORAL | Status: DC

## 2014-10-21 NOTE — Progress Notes (Signed)
Pre visit review using our clinic review tool, if applicable. No additional management support is needed unless otherwise documented below in the visit note. 

## 2014-10-21 NOTE — Patient Instructions (Signed)
Mucinex plain twice daily x 10 days Probiotic such as Digestive Advantage or Phillip's Colon HealthAcute Bronchitis Bronchitis is inflammation of the airways that extend from the windpipe into the lungs (bronchi). The inflammation often causes mucus to develop. This leads to a cough, which is the most common symptom of bronchitis.  In acute bronchitis, the condition usually develops suddenly and goes away over time, usually in a couple weeks. Smoking, allergies, and asthma can make bronchitis worse. Repeated episodes of bronchitis may cause further lung problems.  CAUSES Acute bronchitis is most often caused by the same virus that causes a cold. The virus can spread from person to person (contagious) through coughing, sneezing, and touching contaminated objects. SIGNS AND SYMPTOMS   Cough.   Fever.   Coughing up mucus.   Body aches.   Chest congestion.   Chills.   Shortness of breath.   Sore throat.  DIAGNOSIS  Acute bronchitis is usually diagnosed through a physical exam. Your health care provider will also ask you questions about your medical history. Tests, such as chest X-rays, are sometimes done to rule out other conditions.  TREATMENT  Acute bronchitis usually goes away in a couple weeks. Oftentimes, no medical treatment is necessary. Medicines are sometimes given for relief of fever or cough. Antibiotic medicines are usually not needed but may be prescribed in certain situations. In some cases, an inhaler may be recommended to help reduce shortness of breath and control the cough. A cool mist vaporizer may also be used to help thin bronchial secretions and make it easier to clear the chest.  HOME CARE INSTRUCTIONS  Get plenty of rest.   Drink enough fluids to keep your urine clear or pale yellow (unless you have a medical condition that requires fluid restriction). Increasing fluids may help thin your respiratory secretions (sputum) and reduce chest congestion, and it  will prevent dehydration.   Take medicines only as directed by your health care provider.  If you were prescribed an antibiotic medicine, finish it all even if you start to feel better.  Avoid smoking and secondhand smoke. Exposure to cigarette smoke or irritating chemicals will make bronchitis worse. If you are a smoker, consider using nicotine gum or skin patches to help control withdrawal symptoms. Quitting smoking will help your lungs heal faster.   Reduce the chances of another bout of acute bronchitis by washing your hands frequently, avoiding people with cold symptoms, and trying not to touch your hands to your mouth, nose, or eyes.   Keep all follow-up visits as directed by your health care provider.  SEEK MEDICAL CARE IF: Your symptoms do not improve after 1 week of treatment.  SEEK IMMEDIATE MEDICAL CARE IF:  You develop an increased fever or chills.   You have chest pain.   You have severe shortness of breath.  You have bloody sputum.   You develop dehydration.  You faint or repeatedly feel like you are going to pass out.  You develop repeated vomiting.  You develop a severe headache. MAKE SURE YOU:   Understand these instructions.  Will watch your condition.  Will get help right away if you are not doing well or get worse. Document Released: 11/22/2004 Document Revised: 03/01/2014 Document Reviewed: 04/07/2013 [facility] Patient Information 2015 [place], [place]. This information is not intended to replace advice given to you by your health care provider. Make sure you discuss any questions you have with your health care provider.

## 2014-10-21 NOTE — Progress Notes (Signed)
Morgan Harper  [id number] 09-07-1956 10/21/2014      Progress Note-Follow Up  Subjective  Chief Complaint  Chief Complaint  Patient presents with  . Bronchitis    follow-up    HPI  Patient is a 58 y.o. female in today for routine medical care. Patient is in today complaining of cough and congestion. She has malaise and myalgias. Her cough is nonproductive. She does note some bad breath and chest congestion as well. Mild right ear pain and some low-grade fevers. No chills. No wheezing or shortness of breath. No headache. No GI or GU complaints.  Past Medical History  Diagnosis Date  . Hypertension   . Insomnia   . Hiatal hernia   . GERD (gastroesophageal reflux disease)     Past Surgical History  Procedure Laterality Date  . Tonsillectomy      Family History  Problem Relation Age of Onset  . Hypertension Mother   . Allergies Mother   . Asthma Mother   . Heart disease Neg Hx   . Diabetes Sister     History   Social History  . Marital Status: Married    Spouse Name: N/A    Number of Children: N/A  . Years of Education: N/A   Occupational History  . Not on file.   Social History Main Topics  . Smoking status: Current Every Day Smoker -- 0.50 packs/day for 30 years    Types: Cigarettes  . Smokeless tobacco: Not on file  . Alcohol Use: Not on file  . Drug Use: No  . Sexual Activity: Not on file   Other Topics Concern  . Not on file   Social History Narrative    Current Outpatient Prescriptions on File Prior to Visit  Medication Sig Dispense Refill  . benzonatate (TESSALON) 100 MG capsule Take 1 capsule (100 mg total) by mouth 2 (two) times daily as needed for cough. 45 capsule 0  . cetirizine-pseudoephedrine (ZYRTEC-D) 5-120 MG per tablet Take 1 tablet by mouth.    Marland Kitchen. HYDROcodone-homatropine (HYDROMET) 5-1.5 MG/5ML syrup Take 5 mLs by mouth every 6 (six) hours as needed for cough. 120 mL 0  . lansoprazole (PREVACID) 30 MG capsule TAKE 1 CAPSULE (30  MG TOTAL) BY MOUTH DAILY. 90 capsule 1  . losartan (COZAAR) 100 MG tablet Take 1 tablet (100 mg total) by mouth daily. 30 tablet 2  . sertraline (ZOLOFT) 50 MG tablet TAKE 1 TABLET (50 MG TOTAL) BY MOUTH DAILY. 90 tablet 1  . doxycycline (VIBRA-TABS) 100 MG tablet Take 1 tablet (100 mg total) by mouth 2 (two) times daily. (Patient not taking: Reported on 10/21/2014) 14 tablet 0  . predniSONE (DELTASONE) 20 MG tablet Take 1 tablet (20 mg total) by mouth 2 (two) times daily. (Patient not taking: Reported on 10/21/2014) 14 tablet 0   No current facility-administered medications on file prior to visit.    No Known Allergies  Review of Systems  Review of Systems  Constitutional: Negative for fever and malaise/fatigue.  HENT: Positive for congestion.   Eyes: Negative for discharge.  Respiratory: Positive for cough, sputum production, shortness of breath and wheezing.   Cardiovascular: Negative for chest pain, palpitations and leg swelling.  Gastrointestinal: Negative for heartburn, nausea, vomiting, abdominal pain and diarrhea.  Genitourinary: Negative for dysuria.  Musculoskeletal: Negative for falls.  Skin: Negative for rash.  Neurological: Negative for loss of consciousness and headaches.  Endo/Heme/Allergies: Negative for polydipsia.  Psychiatric/Behavioral: Negative for depression and suicidal ideas. The patient  is not nervous/anxious and does not have insomnia.     Objective  BP 155/71 mmHg  Pulse 96  Temp(Src) 98.2 F (36.8 C) (Oral)  Ht 5\' 1"  (1.549 m)  Wt 138 lb 3.2 oz (62.687 kg)  BMI 26.13 kg/m2  SpO2 96%  Physical Exam  Physical Exam  Constitutional: She is oriented to person, place, and time and well-developed, well-nourished, and in no distress. No distress.  HENT:  Head: Normocephalic and atraumatic.  Eyes: Conjunctivae are normal.  Neck: Neck supple. No thyromegaly present.  Cardiovascular: Normal rate, regular rhythm and normal heart sounds.   No murmur  heard. Pulmonary/Chest: Effort normal. She has wheezes.  Abdominal: She exhibits no distension and no mass.  Musculoskeletal: She exhibits no edema.  Lymphadenopathy:    She has no cervical adenopathy.  Neurological: She is alert and oriented to person, place, and time.  Skin: Skin is warm and dry. No rash noted. She is not diaphoretic.  Psychiatric: Memory, affect and judgment normal.    No results found for: TSH No results found for: WBC, HGB, HCT, MCV, PLT Lab Results  Component Value Date   CREATININE 0.7 09/30/2014   BUN 13 09/30/2014   NA 140 09/30/2014   K 3.7 09/30/2014   CL 104 09/30/2014   CO2 28 09/30/2014   No results found for: ALT, AST, GGT, ALKPHOS, BILITOT Lab Results  Component Value Date   CHOL 190 09/30/2014   Lab Results  Component Value Date   HDL 46.00 09/30/2014   Lab Results  Component Value Date   LDLCALC 125* 09/30/2014   Lab Results  Component Value Date   TRIG 96.0 09/30/2014   Lab Results  Component Value Date   CHOLHDL 4 09/30/2014     Assessment & Plan  Essential hypertension Improved on recheck but still elevated. Poorly controlled will alter medications, encouraged DASH diet, minimize caffeine and obtain adequate sleep. Report concerning symptoms and follow up as directed and as needed. Has been taking Sudafed, encouraged to stop and reassess at next visit.  GERD Avoid offending foods, start probiotics. Do not eat large meals in late evening and consider raising head of bed.   Tobacco abuse Encouraged complete cessation. Discussed need to quit as relates to risk of numerous cancers, cardiac and pulmonary disease as well as neurologic complications. Counseled for greater than 3 minutes  Acute bronchitis Started on Cefdinir and oral steroids, continue Qvar and Tessalon Perles increased.  Encouraged increased rest and hydration, add probiotics, zinc such as Coldeze or Xicam. Treat fevers as needed

## 2014-10-24 DIAGNOSIS — J209 Acute bronchitis, unspecified: Secondary | ICD-10-CM | POA: Insufficient documentation

## 2014-10-24 NOTE — Assessment & Plan Note (Signed)
Avoid offending foods, start probiotics. Do not eat large meals in late evening and consider raising head of bed.  

## 2014-10-24 NOTE — Assessment & Plan Note (Signed)
Encouraged complete cessation. Discussed need to quit as relates to risk of numerous cancers, cardiac and pulmonary disease as well as neurologic complications. Counseled for greater than 3 minutes 

## 2014-10-24 NOTE — Assessment & Plan Note (Signed)
Started on Cefdinir and oral steroids, continue Qvar and Tessalon Perles increased.  Encouraged increased rest and hydration, add probiotics, zinc such as Coldeze or Xicam. Treat fevers as needed

## 2014-10-24 NOTE — Assessment & Plan Note (Addendum)
Improved on recheck but still elevated. Poorly controlled will alter medications, encouraged DASH diet, minimize caffeine and obtain adequate sleep. Report concerning symptoms and follow up as directed and as needed. Has been taking Sudafed, encouraged to stop and reassess at next visit.

## 2014-10-25 ENCOUNTER — Telehealth: Payer: Self-pay | Admitting: Internal Medicine

## 2014-10-25 NOTE — Telephone Encounter (Signed)
emmi emailed °

## 2014-10-26 ENCOUNTER — Ambulatory Visit (INDEPENDENT_AMBULATORY_CARE_PROVIDER_SITE_OTHER): Payer: BC Managed Care – PPO | Admitting: Family Medicine

## 2014-10-26 ENCOUNTER — Encounter: Payer: Self-pay | Admitting: Family Medicine

## 2014-10-26 ENCOUNTER — Ambulatory Visit (HOSPITAL_BASED_OUTPATIENT_CLINIC_OR_DEPARTMENT_OTHER)
Admission: RE | Admit: 2014-10-26 | Discharge: 2014-10-26 | Disposition: A | Discharge status: Home / Self Care | Payer: BC Managed Care – PPO | Source: Ambulatory Visit | Attending: Family Medicine | Admitting: Family Medicine

## 2014-10-26 VITALS — BP 116/76 | HR 84 | Temp 98.2°F | Ht 61.0 in | Wt 138.6 lb

## 2014-10-26 DIAGNOSIS — K219 Gastro-esophageal reflux disease without esophagitis: Secondary | ICD-10-CM

## 2014-10-26 DIAGNOSIS — R05 Cough: Secondary | ICD-10-CM | POA: Diagnosis present

## 2014-10-26 DIAGNOSIS — I10 Essential (primary) hypertension: Secondary | ICD-10-CM

## 2014-10-26 DIAGNOSIS — J209 Acute bronchitis, unspecified: Secondary | ICD-10-CM

## 2014-10-26 DIAGNOSIS — R0602 Shortness of breath: Secondary | ICD-10-CM

## 2014-10-26 DIAGNOSIS — Z720 Tobacco use: Secondary | ICD-10-CM

## 2014-10-26 DIAGNOSIS — J189 Pneumonia, unspecified organism: Secondary | ICD-10-CM | POA: Insufficient documentation

## 2014-10-26 MED ORDER — PREDNISONE 20 MG PO TABS: ORAL_TABLET | ORAL | Status: DC

## 2014-10-26 NOTE — Assessment & Plan Note (Signed)
Poorly controlled with prednisone and bronchitis, encouraged DASH diet, minimize caffeine and obtain adequate sleep. Report concerning symptoms and follow up as directed and as needed

## 2014-10-26 NOTE — Progress Notes (Signed)
Pre visit review using our clinic review tool, if applicable. No additional management support is needed unless otherwise documented below in the visit note. 

## 2014-10-26 NOTE — Assessment & Plan Note (Signed)
Patient understood the directions wrong and has been taking Prednisone at 60 mg for past 5 days, is allowed a refill so she can take 40 mg for 5 days then 20 mg for 5 days, 10 mg for 5 days then start the 5 mg. Start the Cefdinir and probiotics

## 2014-10-26 NOTE — Progress Notes (Signed)
Morgan Harper  [id number] [birth year] 10/26/2014      Progress Note-Follow Up  Subjective  Chief Complaint  Chief Complaint  Patient presents with  . Follow-up    on bronchitis-pt stated felt better on Friday and Sat but on Sun started to feel bad again    HPI  Patient is a 58 y.o. female in today for routine medical care. Was taking more Prednisone than instructed but was doing better on 60 mg than on 40 mg. No fevers but continues to struggle with significant cough and SOB, denies sore throat, ear pain, but does note some clear rhinorrhea. Denies CP/palp/SOB/HA/fevers/GI or GU c/o. Taking meds as prescribed  Past Medical History  Diagnosis Date  . Hypertension   . Insomnia   . Hiatal hernia   . GERD (gastroesophageal reflux disease)     Past Surgical History  Procedure Laterality Date  . Tonsillectomy      Family History  Problem Relation Age of Onset  . Hypertension Mother   . Allergies Mother   . Asthma Mother   . Heart disease Neg Hx   . Diabetes Sister     History   Social History  . Marital Status: Married    Spouse Name: N/A    Number of Children: N/A  . Years of Education: N/A   Occupational History  . Not on file.   Social History Main Topics  . Smoking status: Current Every Day Smoker -- 0.50 packs/day for 30 years    Types: Cigarettes  . Smokeless tobacco: Not on file  . Alcohol Use: Not on file  . Drug Use: No  . Sexual Activity: Not on file   Other Topics Concern  . Not on file   Social History Narrative    Current Outpatient Prescriptions on File Prior to Visit  Medication Sig Dispense Refill  . albuterol (PROVENTIL HFA;VENTOLIN HFA) 108 (90 BASE) MCG/ACT inhaler Inhale 2 puffs into the lungs every 4 (four) hours as needed for wheezing or shortness of breath (cough, shortness of breath or wheezing.). 1 Inhaler 1  . beclomethasone (QVAR) 80 MCG/ACT inhaler Inhale 2 puffs into the lungs 2 (two) times daily. 1 Inhaler 3  .  benzonatate (TESSALON) 200 MG capsule Take 1 capsule (200 mg total) by mouth 2 (two) times daily as needed for cough. 20 capsule 1  . cetirizine-pseudoephedrine (ZYRTEC-D) 5-120 MG per tablet Take 1 tablet by mouth.    . lansoprazole (PREVACID) 30 MG capsule TAKE 1 CAPSULE (30 MG TOTAL) BY MOUTH DAILY. 90 capsule 1  . predniSONE (DELTASONE) 20 MG tablet Take 1 tablet (20 mg total) by mouth 2 (two) times daily. 14 tablet 0  . predniSONE (DELTASONE) 5 MG tablet 1 tab po daily x 5 days then 1/2 tab po daily x 5 days 10 tablet 0  . sertraline (ZOLOFT) 50 MG tablet TAKE 1 TABLET (50 MG TOTAL) BY MOUTH DAILY. 90 tablet 1  . cefdinir (OMNICEF) 300 MG capsule Take 1 capsule (300 mg total) by mouth 2 (two) times daily. (Patient not taking: Reported on 10/26/2014) 20 capsule 0  . doxycycline (VIBRA-TABS) 100 MG tablet Take 1 tablet (100 mg total) by mouth 2 (two) times daily. (Patient not taking: Reported on 10/21/2014) 14 tablet 0  . HYDROcodone-homatropine (HYDROMET) 5-1.5 MG/5ML syrup Take 5 mLs by mouth every 6 (six) hours as needed for cough. (Patient not taking: Reported on 10/26/2014) 120 mL 0   No current facility-administered medications on file prior to visit.  No Known Allergies  Review of Systems  Review of Systems  Constitutional: Positive for malaise/fatigue. Negative for fever.  HENT: Positive for congestion.   Eyes: Negative for discharge.  Respiratory: Positive for cough and shortness of breath.   Cardiovascular: Negative for chest pain, palpitations and leg swelling.  Gastrointestinal: Negative for nausea, abdominal pain and diarrhea.  Genitourinary: Negative for dysuria.  Musculoskeletal: Negative for falls.  Skin: Negative for rash.  Neurological: Negative for loss of consciousness and headaches.  Endo/Heme/Allergies: Negative for polydipsia.  Psychiatric/Behavioral: Negative for depression and suicidal ideas. The patient is not nervous/anxious and does not have insomnia.      Objective  BP 116/76 mmHg  Pulse 84  Temp(Src) 98.2 F (36.8 C) (Oral)  Ht 5\' 1"  (1.549 m)  Wt 138 lb 9.6 oz (62.869 kg)  BMI 26.20 kg/m2  SpO2 97%  Physical Exam  Physical Exam  Constitutional: She is oriented to person, place, and time and well-developed, well-nourished, and in no distress. No distress.  HENT:  Head: Normocephalic and atraumatic.  Eyes: Conjunctivae are normal.  Neck: Neck supple. No thyromegaly present.  Cardiovascular: Normal rate, regular rhythm and normal heart sounds.   No murmur heard. Pulmonary/Chest: Effort normal. She has wheezes.  B/l expiratory wheezing  Abdominal: She exhibits no distension and no mass.  Musculoskeletal: She exhibits no edema.  Lymphadenopathy:    She has no cervical adenopathy.  Neurological: She is alert and oriented to person, place, and time.  Skin: Skin is warm and dry. No rash noted. She is not diaphoretic.  Psychiatric: Memory, affect and judgment normal.    No results found for: TSH No results found for: WBC, HGB, HCT, MCV, PLT Lab Results  Component Value Date   CREATININE 0.7 09/30/2014   BUN 13 09/30/2014   NA 140 09/30/2014   K 3.7 09/30/2014   CL 104 09/30/2014   CO2 28 09/30/2014   No results found for: ALT, AST, GGT, ALKPHOS, BILITOT Lab Results  Component Value Date   CHOL 190 09/30/2014   Lab Results  Component Value Date   HDL 46.00 09/30/2014   Lab Results  Component Value Date   LDLCALC 125* 09/30/2014   Lab Results  Component Value Date   TRIG 96.0 09/30/2014   Lab Results  Component Value Date   CHOLHDL 4 09/30/2014     Assessment & Plan  Essential hypertension Poorly controlled with prednisone and bronchitis, encouraged DASH diet, minimize caffeine and obtain adequate sleep. Report concerning symptoms and follow up as directed and as needed  GERD Avoid offending foods, start probiotics. Do not eat large meals in late evening and consider raising head of bed.   Tobacco  abuse Down to 3 cig in past couple of days, encouraged complete cessation  Acute bronchitis Patient understood the directions wrong and has been taking Prednisone at 60 mg for past 5 days, is allowed a refill so she can take 40 mg for 5 days then 20 mg for 5 days, 10 mg for 5 days then start the 5 mg. Start the Cefdinir and probiotics

## 2014-10-26 NOTE — Patient Instructions (Signed)
Prednisone 40 mg daily x 5 days, 20 mg daily x 5 days, 10 mg daily x 5 days then start the 5 mg tabs   Acute Bronchitis Bronchitis is inflammation of the airways that extend from the windpipe into the lungs (bronchi). The inflammation often causes mucus to develop. This leads to a cough, which is the most common symptom of bronchitis.  In acute bronchitis, the condition usually develops suddenly and goes away over time, usually in a couple weeks. Smoking, allergies, and asthma can make bronchitis worse. Repeated episodes of bronchitis may cause further lung problems.  CAUSES Acute bronchitis is most often caused by the same virus that causes a cold. The virus can spread from person to person (contagious) through coughing, sneezing, and touching contaminated objects. SIGNS AND SYMPTOMS   Cough.   Fever.   Coughing up mucus.   Body aches.   Chest congestion.   Chills.   Shortness of breath.   Sore throat.  DIAGNOSIS  Acute bronchitis is usually diagnosed through a physical exam. Your health care provider will also ask you questions about your medical history. Tests, such as chest X-rays, are sometimes done to rule out other conditions.  TREATMENT  Acute bronchitis usually goes away in a couple weeks. Oftentimes, no medical treatment is necessary. Medicines are sometimes given for relief of fever or cough. Antibiotic medicines are usually not needed but may be prescribed in certain situations. In some cases, an inhaler may be recommended to help reduce shortness of breath and control the cough. A cool mist vaporizer may also be used to help thin bronchial secretions and make it easier to clear the chest.  HOME CARE INSTRUCTIONS  Get plenty of rest.   Drink enough fluids to keep your urine clear or pale yellow (unless you have a medical condition that requires fluid restriction). Increasing fluids may help thin your respiratory secretions (sputum) and reduce chest congestion,  and it will prevent dehydration.   Take medicines only as directed by your health care provider.  If you were prescribed an antibiotic medicine, finish it all even if you start to feel better.  Avoid smoking and secondhand smoke. Exposure to cigarette smoke or irritating chemicals will make bronchitis worse. If you are a smoker, consider using nicotine gum or skin patches to help control withdrawal symptoms. Quitting smoking will help your lungs heal faster.   Reduce the chances of another bout of acute bronchitis by washing your hands frequently, avoiding people with cold symptoms, and trying not to touch your hands to your mouth, nose, or eyes.   Keep all follow-up visits as directed by your health care provider.  SEEK MEDICAL CARE IF: Your symptoms do not improve after 1 week of treatment.  SEEK IMMEDIATE MEDICAL CARE IF:  You develop an increased fever or chills.   You have chest pain.   You have severe shortness of breath.  You have bloody sputum.   You develop dehydration.  You faint or repeatedly feel like you are going to pass out.  You develop repeated vomiting.  You develop a severe headache. MAKE SURE YOU:   Understand these instructions.  Will watch your condition.  Will get help right away if you are not doing well or get worse. Document Released: 11/22/2004 Document Revised: 03/01/2014 Document Reviewed: 04/07/2013 [employer] Patient Information 2015 [place], [place]. This information is not intended to replace advice given to you by your health care provider. Make sure you discuss any questions you have with your  health care provider.  

## 2014-10-26 NOTE — Assessment & Plan Note (Signed)
Avoid offending foods, start probiotics. Do not eat large meals in late evening and consider raising head of bed.  

## 2014-10-26 NOTE — Assessment & Plan Note (Signed)
Down to 3 cig in past couple of days, encouraged complete cessation

## 2014-11-01 ENCOUNTER — Institutional Professional Consult (permissible substitution): Payer: BC Managed Care – PPO | Admitting: Internal Medicine

## 2014-12-29 ENCOUNTER — Other Ambulatory Visit: Payer: Self-pay | Admitting: Internal Medicine

## 2015-02-01 ENCOUNTER — Other Ambulatory Visit: Payer: Self-pay | Admitting: Internal Medicine

## 2015-02-04 ENCOUNTER — Other Ambulatory Visit: Payer: Self-pay | Admitting: Internal Medicine

## 2015-03-18 ENCOUNTER — Ambulatory Visit (INDEPENDENT_AMBULATORY_CARE_PROVIDER_SITE_OTHER): Payer: BLUE CROSS/BLUE SHIELD | Admitting: Medical

## 2015-03-18 ENCOUNTER — Encounter: Payer: Self-pay | Admitting: Medical

## 2015-03-18 VITALS — BP 161/89 | HR 87 | Temp 98.3°F | Ht 61.0 in | Wt 141.0 lb

## 2015-03-18 DIAGNOSIS — I10 Essential (primary) hypertension: Secondary | ICD-10-CM | POA: Diagnosis not present

## 2015-03-18 DIAGNOSIS — J209 Acute bronchitis, unspecified: Secondary | ICD-10-CM | POA: Diagnosis not present

## 2015-03-18 MED ORDER — AMOXICILLIN-POT CLAVULANATE 875-125 MG PO TABS: 1.0000 | ORAL_TABLET | Freq: Two times a day (BID) | ORAL | Status: DC

## 2015-03-18 MED ORDER — ALBUTEROL SULFATE HFA 108 (90 BASE) MCG/ACT IN AERS: 2.0000 | INHALATION_SPRAY | Freq: Four times a day (QID) | RESPIRATORY_TRACT | Status: DC | PRN

## 2015-03-18 MED ORDER — BENZONATATE 200 MG PO CAPS: 200.0000 mg | ORAL_CAPSULE | Freq: Three times a day (TID) | ORAL | Status: DC | PRN

## 2015-03-18 MED ORDER — LISINOPRIL 10 MG PO TABS: 10.0000 mg | ORAL_TABLET | Freq: Every day | ORAL | Status: DC

## 2015-03-18 MED ORDER — FLUTICASONE PROPIONATE 50 MCG/ACT NA SUSP: 2.0000 | Freq: Every day | NASAL | Status: DC

## 2015-03-18 NOTE — Patient Instructions (Addendum)
Acute bronchitis You appear to have bronchitis. Rest hydrate and tylenol for fever. I am prescribing cough medicine benzonatate, and augmentin antibiotic. For your nasal congestion rx flonase.  You should gradually get better. If not then notify us and would recommend a chest xray.  Follow up in 7-10 days or as needed  Lt tm mild dull. Early om. Intermittent sinus pressure left side.   Essential hypertension Based on your blood pressure medicine. I do want you to start lisinopril 10 mg every day. Bmp done in December was ok.     Pt has some recent mild wheezing. Will make albuterol available.

## 2015-03-18 NOTE — Progress Notes (Signed)
   Subjective:    Patient ID: Morgan Harper, female    DOB: Jan 04, 1956, 59 y.o.   MRN: [id number]  HPI   Pt in states feels bronchitis coming on. Last time she had was near thanksgiving. Pt states her nephew 456 yo who had cold type symptoms lives with her. Then she got mild st, nasal congestion and then chest congestion. Symptoms started 3 days ago. Cough is mostly dry.  Pt is starting to wheeze some. Lt side sinus mild pressure.  Pt does smoke. About 1/2 pack a day. More than 30 yrs.   Pt did take zyrtec d yesterday am. Pt bp is elevated. No cardio or neurologic signs or symptoms.        Review of Systems  Constitutional: Negative for fever, chills and fatigue.  HENT: Positive for congestion, postnasal drip, sinus pressure and sore throat.   Respiratory: Positive for cough and wheezing. Negative for choking, shortness of breath and stridor.   Cardiovascular: Negative for chest pain and palpitations.  Musculoskeletal: Negative for back pain and neck pain.  Neurological: Negative for dizziness, tremors, seizures, syncope, weakness, numbness and headaches.  Hematological: Negative for adenopathy. Does not bruise/bleed easily.       Objective:   Physical Exam  General  Mental Status - Alert. General Appearance - Well groomed. Not in acute distress.  Skin Rashes- No Rashes.  HEENT Head- Normal. Ear Auditory Canal - Left- Normal. Right - Normal.Tympanic Membrane- Left- mild dull. Right- Normal. Eye Sclera/Conjunctiva- Left- Normal. Right- Normal. Nose & Sinuses Nasal Mucosa- Left-  Boggy and Congested. Right-  Boggy and  Congested.Bilateral maxillary and frontal sinus pressure. Mouth & Throat Lips: Upper Lip- Normal: no dryness, cracking, pallor, cyanosis, or vesicular eruption. Lower Lip-Normal: no dryness, cracking, pallor, cyanosis or vesicular eruption. Buccal Mucosa- Bilateral- No Aphthous ulcers. Oropharynx- No Discharge or Erythema.+pnd. Tonsils: Characteristics-  Bilateral- No Erythema or Congestion. Size/Enlargement- Bilateral- No enlargement. Discharge- bilateral-None.  Neck Neck- Supple. No Masses.   Chest and Lung Exam Auscultation: Breath Sounds:-even and unlabored. Faint upper lobe rhonchi.  Cardiovascular Auscultation:Rythm- Regular, rate and rhythm. Murmurs & Other Heart Sounds:Ausculatation of the heart reveal- No Murmurs.  Lymphatic Head & Neck General Head & Neck Lymphatics: Bilateral: Description- No Localized lymphadenopathy.     Neurologic Cranial Nerve exam:- CN III-XII intact(No nystagmus), symmetric smile. Strength:- 5/5 equal and symmetric strength both upper and lower extremities.       Assessment & Plan:

## 2015-03-18 NOTE — Assessment & Plan Note (Signed)
Based on your blood pressure medicine. I do want you to start lisinopril 10 mg every day. Bmp done in December was ok.

## 2015-03-18 NOTE — Progress Notes (Signed)
Pre visit review using our clinic review tool, if applicable. No additional management support is needed unless otherwise documented below in the visit note. 

## 2015-03-18 NOTE — Assessment & Plan Note (Signed)
You appear to have bronchitis. Rest hydrate and tylenol for fever. I am prescribing cough medicine benzonatate, and augmentin antibiotic. For your nasal congestion rx flonase.  You should gradually get better. If not then notify us and would recommend a chest xray.  Follow up in 7-10 days or as needed  Lt tm mild dull. Early om. Intermittent sinus pressure left side.

## 2015-03-28 ENCOUNTER — Encounter: Payer: Self-pay | Admitting: Family Medicine

## 2015-03-29 ENCOUNTER — Encounter: Payer: Self-pay | Admitting: Family Medicine

## 2015-03-29 ENCOUNTER — Ambulatory Visit (INDEPENDENT_AMBULATORY_CARE_PROVIDER_SITE_OTHER): Payer: BLUE CROSS/BLUE SHIELD | Admitting: Family Medicine

## 2015-03-29 ENCOUNTER — Ambulatory Visit (HOSPITAL_BASED_OUTPATIENT_CLINIC_OR_DEPARTMENT_OTHER)
Admission: RE | Admit: 2015-03-29 | Discharge: 2015-03-29 | Disposition: A | Discharge status: Home / Self Care | Payer: BLUE CROSS/BLUE SHIELD | Source: Ambulatory Visit | Attending: Family Medicine | Admitting: Family Medicine

## 2015-03-29 VITALS — BP 128/86 | HR 100 | Temp 98.1°F | Resp 20 | Ht 61.0 in | Wt 142.0 lb

## 2015-03-29 DIAGNOSIS — J209 Acute bronchitis, unspecified: Secondary | ICD-10-CM | POA: Diagnosis not present

## 2015-03-29 DIAGNOSIS — R059 Cough, unspecified: Secondary | ICD-10-CM

## 2015-03-29 DIAGNOSIS — J984 Other disorders of lung: Secondary | ICD-10-CM | POA: Insufficient documentation

## 2015-03-29 DIAGNOSIS — R05 Cough: Secondary | ICD-10-CM

## 2015-03-29 DIAGNOSIS — J441 Chronic obstructive pulmonary disease with (acute) exacerbation: Secondary | ICD-10-CM

## 2015-03-29 DIAGNOSIS — J449 Chronic obstructive pulmonary disease, unspecified: Secondary | ICD-10-CM | POA: Insufficient documentation

## 2015-03-29 MED ORDER — ALBUTEROL SULFATE HFA 108 (90 BASE) MCG/ACT IN AERS
2.0000 | INHALATION_SPRAY | RESPIRATORY_TRACT | Status: DC
Start: 2015-03-29 — End: 2015-03-29

## 2015-03-29 MED ORDER — LEVOFLOXACIN 500 MG PO TABS: 500.0000 mg | ORAL_TABLET | Freq: Every day | ORAL | Status: DC

## 2015-03-29 MED ORDER — PREDNISONE 10 MG PO TABS: ORAL_TABLET | ORAL | Status: DC

## 2015-03-29 MED ORDER — IPRATROPIUM-ALBUTEROL 0.5-2.5 (3) MG/3ML IN SOLN
3.0000 mL | Freq: Once | RESPIRATORY_TRACT | Status: AC
  Administered 2015-03-29: 3 mL via RESPIRATORY_TRACT

## 2015-03-29 MED ORDER — METHYLPREDNISOLONE ACETATE 80 MG/ML IJ SUSP
80.0000 mg | Freq: Once | INTRAMUSCULAR | Status: AC
  Administered 2015-03-29: 80 mg via INTRAMUSCULAR

## 2015-03-29 MED ORDER — ALBUTEROL SULFATE HFA 108 (90 BASE) MCG/ACT IN AERS: 2.0000 | INHALATION_SPRAY | Freq: Four times a day (QID) | RESPIRATORY_TRACT | Status: DC | PRN

## 2015-03-29 MED ORDER — IPRATROPIUM-ALBUTEROL 0.5-2.5 (3) MG/3ML IN SOLN
3.0000 mL | RESPIRATORY_TRACT | Status: DC
Start: 2015-03-29 — End: 2015-03-29

## 2015-03-29 MED ORDER — FLUTICASONE-SALMETEROL 250-50 MCG/DOSE IN AEPB: 1.0000 | INHALATION_SPRAY | Freq: Two times a day (BID) | RESPIRATORY_TRACT | Status: DC

## 2015-03-29 NOTE — Progress Notes (Signed)
Pre visit review using our clinic review tool, if applicable. No additional management support is needed unless otherwise documented below in the visit note. 

## 2015-03-29 NOTE — Progress Notes (Signed)
Patient ID: Morgan Harper, female    DOB: 06-13-1956  Age: 59 y.o. MRN: [id number]    Subjective:  Subjective HPI Morgan Harper presents c/o wheezing and coughing that has been going on for weeks.  She last ov.  Review of Systems  Constitutional: Negative for activity change, appetite change, fatigue and unexpected weight change.  Respiratory: Negative for cough and shortness of breath.   Cardiovascular: Negative for chest pain and palpitations.  Psychiatric/Behavioral: Negative for behavioral problems and dysphoric mood. The patient is not nervous/anxious.     History Past Medical History  Diagnosis Date  . Hypertension   . Insomnia   . Hiatal hernia   . GERD (gastroesophageal reflux disease)     She has past surgical history that includes Tonsillectomy.   Her family history includes Allergies in her mother; Asthma in her mother; Diabetes in her sister; Hypertension in her mother. There is no history of Heart disease.She reports that she has been smoking Cigarettes.  She has a 45 pack-year smoking history. She does not have any smokeless tobacco history on file. She reports that she does not use illicit drugs. Her alcohol history is not on file.  Current Outpatient Prescriptions on File Prior to Visit  Medication Sig Dispense Refill  . benzonatate (TESSALON) 200 MG capsule Take 1 capsule (200 mg total) by mouth 3 (three) times daily as needed for cough. 30 capsule 0  . fluticasone (FLONASE) 50 MCG/ACT nasal spray Place 2 sprays into both nostrils daily. 16 g 0  . lansoprazole (PREVACID) 30 MG capsule TAKE 1 CAPSULE (30 MG TOTAL) BY MOUTH DAILY. 30 capsule 0  . lisinopril (PRINIVIL,ZESTRIL) 10 MG tablet Take 1 tablet (10 mg total) by mouth daily. 30 tablet 1  . sertraline (ZOLOFT) 50 MG tablet TAKE 1 TABLET (50 MG TOTAL) BY MOUTH DAILY. 90 tablet 3   No current facility-administered medications on file prior to visit.     Objective:  Objective Physical Exam  Constitutional:  She is oriented to person, place, and time. She appears well-developed and well-nourished.  HENT:  Right Ear: External ear normal.  Left Ear: External ear normal.  + PND + errythema  Eyes: Conjunctivae are normal. Right eye exhibits no discharge. Left eye exhibits no discharge.  Neck: Normal range of motion. Neck supple.  Cardiovascular: Normal rate, regular rhythm and normal heart sounds.   No murmur heard. Pulmonary/Chest: She is in respiratory distress. She has wheezes. She has no rales. She exhibits no tenderness.  Musculoskeletal: She exhibits no edema.  Lymphadenopathy:    She has cervical adenopathy.  Neurological: She is alert and oriented to person, place, and time.  Psychiatric: She has a normal mood and affect. Her behavior is normal. Judgment and thought content normal.   BP 128/86 mmHg  Pulse 100  Temp(Src) 98.1 F (36.7 C) (Oral)  Resp 20  Ht  (1.549 m)  Wt 142 lb (64.411 kg)  BMI 26.84 kg/m2  SpO2 98% Wt Readings from Last 3 Encounters:  03/29/15 142 lb (64.411 kg)  03/18/15 141 lb (63.957 kg)  10/26/14 138 lb 9.6 oz (62.869 kg)     Lab Results  Component Value Date   GLUCOSE 65* 09/30/2014   CHOL 190 09/30/2014   TRIG 96.0 09/30/2014   HDL 46.00 09/30/2014   LDLCALC 125* 09/30/2014   NA 140 09/30/2014   K 3.7 09/30/2014   CL 104 09/30/2014   CREATININE 0.7 09/30/2014   BUN 13 09/30/2014   CO2 28 09/30/2014  No results found.   Assessment & Plan:  Plan I have discontinued Morgan Harper's amoxicillin-clavulanate. I am also having her start on predniSONE, levofloxacin, and Fluticasone-Salmeterol. Additionally, I am having her maintain her lansoprazole, sertraline, lisinopril, benzonatate, fluticasone, and albuterol. We administered methylPREDNISolone acetate and ipratropium-albuterol.  Meds ordered this encounter  Medications  . predniSONE (DELTASONE) 10 MG tablet    Sig: 3 po qd for 3 days then 2 po qd for 3 days the 1 po qd for 3 days     Dispense:  18 tablet    Refill:  0  . levofloxacin (LEVAQUIN) 500 MG tablet    Sig: Take 1 tablet (500 mg total) by mouth daily.    Dispense:  10 tablet    Refill:  0  . DISCONTD: ipratropium-albuterol (DUONEB) 0.5-2.5 (3) MG/3ML nebulizer solution 3 mL    Sig:   . methylPREDNISolone acetate (DEPO-MEDROL) injection 80 mg    Sig:   . DISCONTD: albuterol (PROVENTIL HFA;VENTOLIN HFA) 108 (90 BASE) MCG/ACT inhaler    Sig: Inhale 2 puffs into the lungs now.    Dispense:  1 Inhaler    Refill:  1  . albuterol (PROVENTIL HFA;VENTOLIN HFA) 108 (90 BASE) MCG/ACT inhaler    Sig: Inhale 2 puffs into the lungs every 6 (six) hours as needed for wheezing or shortness of breath.    Dispense:  1 Inhaler    Refill:  0  . ipratropium-albuterol (DUONEB) 0.5-2.5 (3) MG/3ML nebulizer solution 3 mL    Sig:   . Fluticasone-Salmeterol (ADVAIR DISKUS) 250-50 MCG/DOSE AEPB    Sig: Inhale 1 puff into the lungs 2 (two) times daily.    Dispense:  1 each    Refill:  3    Problem List Items Addressed This Visit    Acute bronchitis   Relevant Medications   predniSONE (DELTASONE) 10 MG tablet   levofloxacin (LEVAQUIN) 500 MG tablet   methylPREDNISolone acetate (DEPO-MEDROL) injection 80 mg (Completed)   ipratropium-albuterol (DUONEB) 0.5-2.5 (3) MG/3ML nebulizer solution 3 mL (Completed)    Other Visit Diagnoses    Cough    -  Primary    Relevant Orders    DG Chest 2 View    COPD exacerbation        Relevant Medications    predniSONE (DELTASONE) 10 MG tablet    methylPREDNISolone acetate (DEPO-MEDROL) injection 80 mg (Completed)    albuterol (PROVENTIL HFA;VENTOLIN HFA) 108 (90 BASE) MCG/ACT inhaler    ipratropium-albuterol (DUONEB) 0.5-2.5 (3) MG/3ML nebulizer solution 3 mL (Completed)    Fluticasone-Salmeterol (ADVAIR DISKUS) 250-50 MCG/DOSE AEPB       Follow-up: Return in about 2 weeks (around 04/12/2015), or if symptoms worsen or fail to improve.  Loreen FreudYvonne Lowne, DO

## 2015-03-29 NOTE — Patient Instructions (Signed)
Smoking Cessation Quitting smoking is important to your health and has many advantages. However, it is not always easy to quit since nicotine is a very addictive drug. Oftentimes, people try 3 times or more before being able to quit. This document explains the best ways for you to prepare to quit smoking. Quitting takes hard work and a lot of effort, but you can do it. ADVANTAGES OF QUITTING SMOKING  You will live longer, feel better, and live better.  Your body will feel the impact of quitting smoking almost immediately.  Within 20 minutes, blood pressure decreases. Your pulse returns to its normal level.  After 8 hours, carbon monoxide levels in the blood return to normal. Your oxygen level increases.  After 24 hours, the chance of having a heart attack starts to decrease. Your breath, hair, and body stop smelling like smoke.  After 48 hours, damaged nerve endings begin to recover. Your sense of taste and smell improve.  After 72 hours, the body is virtually free of nicotine. Your bronchial tubes relax and breathing becomes easier.  After 2 to 12 weeks, lungs can hold more air. Exercise becomes easier and circulation improves.  The risk of having a heart attack, stroke, cancer, or lung disease is greatly reduced.  After 1 year, the risk of coronary heart disease is cut in half.  After 5 years, the risk of stroke falls to the same as a nonsmoker.  After 10 years, the risk of lung cancer is cut in half and the risk of other cancers decreases significantly.  After 15 years, the risk of coronary heart disease drops, usually to the level of a nonsmoker.  If you are pregnant, quitting smoking will improve your chances of having a healthy baby.  The people you live with, especially any children, will be healthier.  You will have extra money to spend on things other than cigarettes. QUESTIONS TO THINK ABOUT BEFORE ATTEMPTING TO QUIT You may want to talk about your answers with your  health care provider.  Why do you want to quit?  If you tried to quit in the past, what helped and what did not?  What will be the most difficult situations for you after you quit? How will you plan to handle them?  Who can help you through the tough times? Your family? Friends? A health care provider?  What pleasures do you get from smoking? What ways can you still get pleasure if you quit? Here are some questions to ask your health care provider:  How can you help me to be successful at quitting?  What medicine do you think would be best for me and how should I take it?  What should I do if I need more help?  What is smoking withdrawal like? How can I get information on withdrawal? GET READY  Set a quit date.  Change your environment by getting rid of all cigarettes, ashtrays, matches, and lighters in your home, car, or work. Do not let people smoke in your home.  Review your past attempts to quit. Think about what worked and what did not. GET SUPPORT AND ENCOURAGEMENT You have a better chance of being successful if you have help. You can get support in many ways.  Tell your family, friends, and coworkers that you are going to quit and need their support. Ask them not to smoke around you.  Get individual, group, or telephone counseling and support. Programs are available at local hospitals and health centers. Call   your local health department for information about programs in your area.  Spiritual beliefs and practices may help some smokers quit.  Download a "quit meter" on your computer to keep track of quit statistics, such as how long you have gone without smoking, cigarettes not smoked, and money saved.  Get a self-help book about quitting smoking and staying off tobacco. LEARN NEW SKILLS AND BEHAVIORS  Distract yourself from urges to smoke. Talk to someone, go for a walk, or occupy your time with a task.  Change your normal routine. Take a different route to work.  Drink tea instead of coffee. Eat breakfast in a different place.  Reduce your stress. Take a hot bath, exercise, or read a book.  Plan something enjoyable to do every day. Reward yourself for not smoking.  Explore interactive web-based programs that specialize in helping you quit. GET MEDICINE AND USE IT CORRECTLY Medicines can help you stop smoking and decrease the urge to smoke. Combining medicine with the above behavioral methods and support can greatly increase your chances of successfully quitting smoking.  Nicotine replacement therapy helps deliver nicotine to your body without the negative effects and risks of smoking. Nicotine replacement therapy includes nicotine gum, lozenges, inhalers, nasal sprays, and skin patches. Some may be available over-the-counter and others require a prescription.  Antidepressant medicine helps people abstain from smoking, but how this works is unknown. This medicine is available by prescription.  Nicotinic receptor partial agonist medicine simulates the effect of nicotine in your brain. This medicine is available by prescription. Ask your health care provider for advice about which medicines to use and how to use them based on your health history. Your health care provider will tell you what side effects to look out for if you choose to be on a medicine or therapy. Carefully read the information on the package. Do not use any other product containing nicotine while using a nicotine replacement product.  RELAPSE OR DIFFICULT SITUATIONS Most relapses occur within the first 3 months after quitting. Do not be discouraged if you start smoking again. Remember, most people try several times before finally quitting. You may have symptoms of withdrawal because your body is used to nicotine. You may crave cigarettes, be irritable, feel very hungry, cough often, get headaches, or have difficulty concentrating. The withdrawal symptoms are only temporary. They are strongest  when you first quit, but they will go away within 10-14 days. To reduce the chances of relapse, try to:  Avoid drinking alcohol. Drinking lowers your chances of successfully quitting.  Reduce the amount of caffeine you consume. Once you quit smoking, the amount of caffeine in your body increases and can give you symptoms, such as a rapid heartbeat, sweating, and anxiety.  Avoid smokers because they can make you want to smoke.  Do not let weight gain distract you. Many smokers will gain weight when they quit, usually less than 10 pounds. Eat a healthy diet and stay active. You can always lose the weight gained after you quit.  Find ways to improve your mood other than smoking. FOR MORE INFORMATION  www.smokefree.gov  Document Released: 10/09/2001 Document Revised: 03/01/2014 Document Reviewed: 01/24/2012 [employer] Patient Information 2015 [employer]. This information is not intended to replace advice given to you by your health care provider. Make sure you discuss any questions you have with your health care provider.  

## 2015-03-31 ENCOUNTER — Telehealth: Payer: Self-pay | Admitting: Family Medicine

## 2015-03-31 NOTE — Telephone Encounter (Signed)
Patient aware letter ready for pick up.      KP 

## 2015-03-31 NOTE — Telephone Encounter (Signed)
Ok to give note 

## 2015-03-31 NOTE — Telephone Encounter (Signed)
Relation to pt: self Call back number: [phone]    Reason for call:  Pt was last seen 03/29/2015 requesting doctor note excusing her from work from 03/30/15  returning back to work on 04/04/15

## 2015-03-31 NOTE — Telephone Encounter (Signed)
Please advise      KP 

## 2015-07-24 ENCOUNTER — Encounter (HOSPITAL_BASED_OUTPATIENT_CLINIC_OR_DEPARTMENT_OTHER): Payer: Self-pay | Admitting: Emergency Medicine

## 2015-07-24 ENCOUNTER — Emergency Department (HOSPITAL_BASED_OUTPATIENT_CLINIC_OR_DEPARTMENT_OTHER): Payer: BLUE CROSS/BLUE SHIELD

## 2015-07-24 ENCOUNTER — Emergency Department (HOSPITAL_BASED_OUTPATIENT_CLINIC_OR_DEPARTMENT_OTHER)
Admission: EM | Admit: 2015-07-24 | Discharge: 2015-07-24 | Disposition: A | Discharge status: Home / Self Care | Payer: BLUE CROSS/BLUE SHIELD | Attending: Emergency Medicine | Admitting: Emergency Medicine

## 2015-07-24 DIAGNOSIS — Z7951 Long term (current) use of inhaled steroids: Secondary | ICD-10-CM | POA: Diagnosis not present

## 2015-07-24 DIAGNOSIS — K219 Gastro-esophageal reflux disease without esophagitis: Secondary | ICD-10-CM | POA: Insufficient documentation

## 2015-07-24 DIAGNOSIS — Z8669 Personal history of other diseases of the nervous system and sense organs: Secondary | ICD-10-CM | POA: Diagnosis not present

## 2015-07-24 DIAGNOSIS — Z79899 Other long term (current) drug therapy: Secondary | ICD-10-CM | POA: Diagnosis not present

## 2015-07-24 DIAGNOSIS — Z720 Tobacco use: Secondary | ICD-10-CM | POA: Insufficient documentation

## 2015-07-24 DIAGNOSIS — J449 Chronic obstructive pulmonary disease, unspecified: Secondary | ICD-10-CM | POA: Insufficient documentation

## 2015-07-24 DIAGNOSIS — R0602 Shortness of breath: Secondary | ICD-10-CM | POA: Diagnosis present

## 2015-07-24 DIAGNOSIS — I10 Essential (primary) hypertension: Secondary | ICD-10-CM | POA: Insufficient documentation

## 2015-07-24 DIAGNOSIS — J441 Chronic obstructive pulmonary disease with (acute) exacerbation: Secondary | ICD-10-CM

## 2015-07-24 LAB — CBC WITH DIFFERENTIAL/PLATELET
BASOS ABS: 0.1 10*3/uL (ref 0.0–0.1)
BASOS PCT: 1 %
EOS ABS: 0.4 10*3/uL (ref 0.0–0.7)
Eosinophils Relative: 5 %
HCT: 36.8 % (ref 36.0–46.0)
Hemoglobin: 11.9 g/dL — ABNORMAL LOW (ref 12.0–15.0)
LYMPHS PCT: 21 %
Lymphs Abs: 1.6 10*3/uL (ref 0.7–4.0)
MCH: 29.9 pg (ref 26.0–34.0)
MCHC: 32.3 g/dL (ref 30.0–36.0)
MCV: 92.5 fL (ref 78.0–100.0)
Monocytes Absolute: 0.8 10*3/uL (ref 0.1–1.0)
Monocytes Relative: 10 %
Neutro Abs: 4.6 10*3/uL (ref 1.7–7.7)
Neutrophils Relative %: 63 %
PLATELETS: 280 10*3/uL (ref 150–400)
RBC: 3.98 MIL/uL (ref 3.87–5.11)
RDW: 13.5 % (ref 11.5–15.5)
WBC: 7.4 10*3/uL (ref 4.0–10.5)

## 2015-07-24 LAB — BASIC METABOLIC PANEL
Anion gap: 7 (ref 5–15)
BUN: 15 mg/dL (ref 6–20)
CALCIUM: 9.1 mg/dL (ref 8.9–10.3)
CO2: 25 mmol/L (ref 22–32)
CREATININE: 0.63 mg/dL (ref 0.44–1.00)
Chloride: 108 mmol/L (ref 101–111)
GFR calc Af Amer: >60 mL/min (ref >60)
GFR calc non Af Amer: >60 mL/min (ref >60)
GLUCOSE: 99 mg/dL (ref 65–99)
POTASSIUM: 3.3 mmol/L — AB (ref 3.5–5.1)
SODIUM: 140 mmol/L (ref 135–145)

## 2015-07-24 MED ORDER — METHYLPREDNISOLONE SODIUM SUCC 125 MG IJ SOLR
125.0000 mg | Freq: Once | INTRAMUSCULAR | Status: AC
  Administered 2015-07-24: 125 mg via INTRAVENOUS
  Filled 2015-07-24: qty 2

## 2015-07-24 MED ORDER — IPRATROPIUM BROMIDE 0.02 % IN SOLN
0.5000 mg | RESPIRATORY_TRACT | Status: AC
  Administered 2015-07-24: 0.5 mg via RESPIRATORY_TRACT

## 2015-07-24 MED ORDER — ALBUTEROL SULFATE (2.5 MG/3ML) 0.083% IN NEBU
5.0000 mg | INHALATION_SOLUTION | RESPIRATORY_TRACT | Status: AC
  Administered 2015-07-24: 5 mg via RESPIRATORY_TRACT

## 2015-07-24 MED ORDER — IPRATROPIUM-ALBUTEROL 0.5-2.5 (3) MG/3ML IN SOLN
3.0000 mL | RESPIRATORY_TRACT | Status: DC
  Administered 2015-07-24: 3 mL via RESPIRATORY_TRACT
  Filled 2015-07-24: qty 3

## 2015-07-24 MED ORDER — ALBUTEROL SULFATE (2.5 MG/3ML) 0.083% IN NEBU
INHALATION_SOLUTION | RESPIRATORY_TRACT | Status: AC
  Filled 2015-07-24: qty 6

## 2015-07-24 MED ORDER — METHYLPREDNISOLONE 4 MG PO TBPK: ORAL_TABLET | ORAL | Status: DC

## 2015-07-24 MED ORDER — ALBUTEROL SULFATE (2.5 MG/3ML) 0.083% IN NEBU
2.5000 mg | INHALATION_SOLUTION | Freq: Once | RESPIRATORY_TRACT | Status: AC
  Administered 2015-07-24: 2.5 mg via RESPIRATORY_TRACT
  Filled 2015-07-24: qty 3

## 2015-07-24 MED ORDER — IPRATROPIUM BROMIDE 0.02 % IN SOLN
RESPIRATORY_TRACT | Status: AC
  Administered 2015-07-24: 0.5 mg via RESPIRATORY_TRACT
  Filled 2015-07-24: qty 2.5

## 2015-07-24 NOTE — ED Notes (Signed)
Neb complete. Spilled some on ground, states,  "thought it was finished".

## 2015-07-24 NOTE — ED Provider Notes (Addendum)
CSN: [id number]     Arrival date & time 07/24/15  0211 History   First MD Initiated Contact with Patient 07/24/15 [phone]     Chief Complaint  Patient presents with  . Shortness of Breath     (Consider location/radiation/quality/duration/timing/severity/associated sxs/prior Treatment) HPI  This is a 59 year old female with a history of bronchitis. She is here with cough, shortness of breath and wheezing that began 4 days ago. She got her albuterol and Tessalon Perles refilled 2 days ago. Despite using them she woke this morning with moderate to severe shortness of breath and cough. She has not had a fever. The cough is mostly nonproductive. She was given an albuterol and Atrovent neb treatment prior to my evaluation with transient improvement.  Past Medical History  Diagnosis Date  . Hypertension   . Insomnia   . Hiatal hernia   . GERD (gastroesophageal reflux disease)    Past Surgical History  Procedure Laterality Date  . Tonsillectomy     Family History  Problem Relation Age of Onset  . Hypertension Mother   . Allergies Mother   . Asthma Mother   . Heart disease Neg Hx   . Diabetes Sister    Social History  Substance Use Topics  . Smoking status: Current Every Day Smoker -- 1.00 packs/day for 45 years    Types: Cigarettes  . Smokeless tobacco: None  . Alcohol Use: None   OB History    No data available     Review of Systems  All other systems reviewed and are negative.   Allergies  Review of patient's allergies indicates no known allergies.  Home Medications   Prior to Admission medications   Medication Sig Start Date End Date Taking? Authorizing Provider  albuterol (PROVENTIL HFA;VENTOLIN HFA) 108 (90 BASE) MCG/ACT inhaler Inhale 2 puffs into the lungs every 6 (six) hours as needed for wheezing or shortness of breath. 03/29/15   Lelon Perla, DO  benzonatate (TESSALON) 200 MG capsule Take 1 capsule (200 mg total) by mouth 3 (three) times daily as needed for  cough. 03/18/15   Ramon Dredge Saguier, PA-C  fluticasone (FLONASE) 50 MCG/ACT nasal spray Place 2 sprays into both nostrils daily. 03/18/15   Ramon Dredge Saguier, PA-C  Fluticasone-Salmeterol (ADVAIR DISKUS) 250-50 MCG/DOSE AEPB Inhale 1 puff into the lungs 2 (two) times daily. 03/29/15   Lelon Perla, DO  lansoprazole (PREVACID) 30 MG capsule TAKE 1 CAPSULE (30 MG TOTAL) BY MOUTH DAILY. 12/30/14   Judie Bonus, MD  lisinopril (PRINIVIL,ZESTRIL) 10 MG tablet Take 1 tablet (10 mg total) by mouth daily. 03/18/15   Esperanza Richters, PA-C  methylPREDNISolone (MEDROL DOSEPAK) 4 MG TBPK tablet Take tapering dose per package instructions starting Monday, 07/24/2015. 07/24/15   Paula Libra, MD  sertraline (ZOLOFT) 50 MG tablet TAKE 1 TABLET (50 MG TOTAL) BY MOUTH DAILY. 02/02/15   Judie Bonus, MD   BP 143/62 mmHg  Pulse 97  Temp(Src) 98.7 F (37.1 C) (Oral)  Resp 22  Ht  (1.549 m)  Wt 130 lb (58.968 kg)  BMI 24.58 kg/m2  SpO2 100%   Physical Exam  General: Well-developed, well-nourished female in no acute distress; appearance consistent with age of record HENT: normocephalic; atraumatic Eyes: pupils equal, round and reactive to light; extraocular muscles intact Neck: supple Heart: regular rate and rhythm; no murmurs, rubs or gallops Lungs: Decreased air movement bilaterally; expiratory wheezes; frequent cough Abdomen: soft; nondistended; nontender; no masses or hepatosplenomegaly; bowel sounds present Extremities: No  deformity; full range of motion; pulses normal Neurologic: Awake, alert and oriented; motor function intact in all extremities and symmetric; no facial droop Skin: Warm and dry Psychiatric: Normal mood and affect    ED Course  Procedures (including critical care time)   MDM  Nursing notes and vitals signs, including pulse oximetry, reviewed.  Summary of this visit's results, reviewed by myself:  Labs:  Results for orders placed or performed during the hospital encounter  of 07/24/15 (from the past 24 hour(s))  Basic metabolic panel     Status: Abnormal   Collection Time: 07/24/15  5:42 AM  Result Value Ref Range   Sodium 140 135 - 145 mmol/L   Potassium 3.3 (L) 3.5 - 5.1 mmol/L   Chloride 108 101 - 111 mmol/L   CO2 25 22 - 32 mmol/L   Glucose, Bld 99 65 - 99 mg/dL   BUN 15 6 - 20 mg/dL   Creatinine, Ser 1.61 0.44 - 1.00 mg/dL   Calcium 9.1 8.9 - 09.6 mg/dL   GFR calc non Af Amer >60 >60 mL/min   GFR calc Af Amer >60 >60 mL/min   Anion gap 7 5 - 15  CBC with Differential/Platelet     Status: Abnormal   Collection Time: 07/24/15  5:42 AM  Result Value Ref Range   WBC 7.4 4.0 - 10.5 K/uL   RBC 3.98 3.87 - 5.11 MIL/uL   Hemoglobin 11.9 (L) 12.0 - 15.0 g/dL   HCT 04.5 40.9 - 81.1 %   MCV 92.5 78.0 - 100.0 fL   MCH 29.9 26.0 - 34.0 pg   MCHC 32.3 30.0 - 36.0 g/dL   RDW 91.4 78.2 - 95.6 %   Platelets 280 150 - 400 K/uL   Neutrophils Relative % 63 %   Neutro Abs 4.6 1.7 - 7.7 K/uL   Lymphocytes Relative 21 %   Lymphs Abs 1.6 0.7 - 4.0 K/uL   Monocytes Relative 10 %   Monocytes Absolute 0.8 0.1 - 1.0 K/uL   Eosinophils Relative 5 %   Eosinophils Absolute 0.4 0.0 - 0.7 K/uL   Basophils Relative 1 %   Basophils Absolute 0.1 0.0 - 0.1 K/uL    Imaging Studies: Dg Chest 2 View  07/24/2015   CLINICAL DATA:  Shortness of breath for 2 days.  Current smoker.  EXAM: CHEST  2 VIEW  COMPARISON:  03/29/2015  FINDINGS: Emphysematous changes in the lungs. Central interstitial pattern consistent with chronic bronchitis. Normal heart size and pulmonary vascularity. No focal airspace disease or consolidation in the lungs. No blunting of costophrenic angles. No pneumothorax. Mediastinal contours appear intact. Degenerative changes in the spine.  IMPRESSION: Emphysematous changes and chronic bronchitic changes in the lungs. No evidence of active pulmonary disease.   Electronically Signed   By: Burman Nieves M.D.   On: 07/24/2015 03:56   6:01 AM Air movement  improved, patient feels better after second neb treatment. She states she feels well enough to go home. We'll put her on a Medrol Dosepak. She was advised to contact her PCP tomorrow for further evaluation. She may need referral to pulmonologist.  Paula Libra, MD 07/24/15 2130  Paula Libra, MD 07/24/15 [phone]

## 2015-07-24 NOTE — ED Notes (Signed)
Ambulatory back from xray, alert, NAD, calm, no dyspnea noted.

## 2015-07-24 NOTE — ED Notes (Addendum)
RT at [facility], neb complete. Pt smokes 0.5 ppd, last bronchitis flare, CXR and prednisone pack in May, "think I am starting COPD", (denies pain), reports possible subjective fever and some cough production in the morning (scant/ tan). Alert, NAD, calm, interactive.

## 2015-07-24 NOTE — ED Notes (Signed)
Patient states that she has been SOB for the last 2 days. The patient reports that she has been using her albuterol inhaler

## 2015-08-05 ENCOUNTER — Encounter: Payer: Self-pay | Admitting: Physician Assistant

## 2015-08-05 ENCOUNTER — Ambulatory Visit (INDEPENDENT_AMBULATORY_CARE_PROVIDER_SITE_OTHER): Payer: BLUE CROSS/BLUE SHIELD | Admitting: Physician Assistant

## 2015-08-05 VITALS — BP 168/88 | HR 86 | Resp 16 | Ht 61.0 in | Wt 144.1 lb

## 2015-08-05 DIAGNOSIS — J441 Chronic obstructive pulmonary disease with (acute) exacerbation: Secondary | ICD-10-CM | POA: Diagnosis not present

## 2015-08-05 MED ORDER — FLUTICASONE-SALMETEROL 250-50 MCG/DOSE IN AEPB: 1.0000 | INHALATION_SPRAY | Freq: Two times a day (BID) | RESPIRATORY_TRACT | Status: DC

## 2015-08-05 MED ORDER — LEVOFLOXACIN 500 MG PO TABS: 500.0000 mg | ORAL_TABLET | Freq: Every day | ORAL | Status: DC

## 2015-08-05 MED ORDER — HYDROCOD POLST-CPM POLST ER 10-8 MG/5ML PO SUER: 5.0000 mL | Freq: Two times a day (BID) | ORAL | Status: DC | PRN

## 2015-08-05 NOTE — Patient Instructions (Signed)
Please take antibiotic as directed. Stay well-hydrated and start the Advair daily. Continue the albuterol as directed. Use Tussionex for cough. Get plenty of rest.  Call if symptoms are not resolving. You will be contacted by Pulmonology for further assessment of COPD.

## 2015-08-05 NOTE — Progress Notes (Signed)
Pre visit review using our clinic review tool, if applicable. No additional management support is needed unless otherwise documented below in the visit note/SLS  

## 2015-08-08 NOTE — Progress Notes (Signed)
Patient presents to clinic today for ER follow-up of COPD exacerbation, with continued symptoms despite prednisone taper. Endorses some improvement in breathing but still with SOB, chest tightness, and productive cough. Is taking her Albuterol every 6 hours. Is not on maintenance inhaler for COPD. Denies fever, chills or chest pain.  Past Medical History  Diagnosis Date  . Hypertension   . Insomnia   . Hiatal hernia   . GERD (gastroesophageal reflux disease)     Current Outpatient Prescriptions on File Prior to Visit  Medication Sig Dispense Refill  . albuterol (PROVENTIL HFA;VENTOLIN HFA) 108 (90 BASE) MCG/ACT inhaler Inhale 2 puffs into the lungs every 6 (six) hours as needed for wheezing or shortness of breath. 1 Inhaler 0  . benzonatate (TESSALON) 200 MG capsule Take 1 capsule (200 mg total) by mouth 3 (three) times daily as needed for cough. 30 capsule 0  . fluticasone (FLONASE) 50 MCG/ACT nasal spray Place 2 sprays into both nostrils daily. 16 g 0  . lansoprazole (PREVACID) 30 MG capsule TAKE 1 CAPSULE (30 MG TOTAL) BY MOUTH DAILY. 30 capsule 0  . lisinopril (PRINIVIL,ZESTRIL) 10 MG tablet Take 1 tablet (10 mg total) by mouth daily. 30 tablet 1  . sertraline (ZOLOFT) 50 MG tablet TAKE 1 TABLET (50 MG TOTAL) BY MOUTH DAILY. 90 tablet 3   No current facility-administered medications on file prior to visit.    No Known Allergies  Family History  Problem Relation Age of Onset  . Hypertension Mother   . Allergies Mother   . Asthma Mother   . Heart disease Neg Hx   . Diabetes Sister     Social History   Social History  . Marital Status: Married    Spouse Name: N/A  . Number of Children: N/A  . Years of Education: N/A   Social History Main Topics  . Smoking status: Current Some Day Smoker -- 1.00 packs/day for 45 years    Types: Cigarettes  . Smokeless tobacco: Not on file  . Alcohol Use: Not on file  . Drug Use: No  . Sexual Activity: Not on file   Other Topics  Concern  . Not on file   Social History Narrative    Review of Systems - See HPI.  All other ROS are negative.  BP 168/88 mmHg  Pulse 86  Resp 16  Ht '5\' 1"'  (1.549 m)  Wt 144 lb 2 oz (65.375 kg)  BMI 27.25 kg/m2  SpO2 97%  Physical Exam  Constitutional: She is oriented to person, place, and time and well-developed, well-nourished, and in no distress.  HENT:  Head: Normocephalic and atraumatic.  Right Ear: External ear normal.  Left Ear: External ear normal.  Nose: Nose normal.  Mouth/Throat: Oropharynx is clear and moist. No oropharyngeal exudate.  TM within normal limits bilaterally.  Eyes: Conjunctivae are normal.  Neck: Neck supple.  Cardiovascular: Normal rate, regular rhythm, normal heart sounds and intact distal pulses.   Pulmonary/Chest: Effort normal. No respiratory distress. She has wheezes. She has no rales. She exhibits no tenderness.  Neurological: She is alert and oriented to person, place, and time.  Skin: Skin is warm and dry. No rash noted.  Psychiatric: Affect normal.  Vitals reviewed.   Recent Results (from the past 2160 hour(s))  Basic metabolic panel     Status: Abnormal   Collection Time: 07/24/15  5:42 AM  Result Value Ref Range   Sodium 140 135 - 145 mmol/L   Potassium 3.3 (L)  3.5 - 5.1 mmol/L   Chloride 108 101 - 111 mmol/L   CO2 25 22 - 32 mmol/L   Glucose, Bld 99 65 - 99 mg/dL   BUN 15 6 - 20 mg/dL   Creatinine, Ser 0.63 0.44 - 1.00 mg/dL   Calcium 9.1 8.9 - 10.3 mg/dL   GFR calc non Af Amer >60 >60 mL/min   GFR calc Af Amer >60 >60 mL/min    Comment: (NOTE) The eGFR has been calculated using the CKD EPI equation. This calculation has not been validated in all clinical situations. eGFR's persistently <60 mL/min signify possible Chronic Kidney Disease.    Anion gap 7 5 - 15  CBC with Differential/Platelet     Status: Abnormal   Collection Time: 07/24/15  5:42 AM  Result Value Ref Range   WBC 7.4 4.0 - 10.5 K/uL   RBC 3.98 3.87 - 5.11  MIL/uL   Hemoglobin 11.9 (L) 12.0 - 15.0 g/dL   HCT 36.8 36.0 - 46.0 %   MCV 92.5 78.0 - 100.0 fL   MCH 29.9 26.0 - 34.0 pg   MCHC 32.3 30.0 - 36.0 g/dL   RDW 13.5 11.5 - 15.5 %   Platelets 280 150 - 400 K/uL   Neutrophils Relative % 63 %   Neutro Abs 4.6 1.7 - 7.7 K/uL   Lymphocytes Relative 21 %   Lymphs Abs 1.6 0.7 - 4.0 K/uL   Monocytes Relative 10 %   Monocytes Absolute 0.8 0.1 - 1.0 K/uL   Eosinophils Relative 5 %   Eosinophils Absolute 0.4 0.0 - 0.7 K/uL   Basophils Relative 1 %   Basophils Absolute 0.1 0.0 - 0.1 K/uL    Assessment/Plan: COPD exacerbation (HCC) CXR in ER reviewed. Negative for CAP.  Rx Levaquin. Rx Advair BID. Continue Albuterol. Tussionex for cough. Increase fluids. Rest. Return precautions discussed.

## 2015-08-09 ENCOUNTER — Encounter: Payer: Self-pay | Admitting: Physician Assistant

## 2015-08-09 DIAGNOSIS — J441 Chronic obstructive pulmonary disease with (acute) exacerbation: Secondary | ICD-10-CM | POA: Insufficient documentation

## 2015-08-09 NOTE — Assessment & Plan Note (Signed)
CXR in ER reviewed. Negative for CAP.  Rx Levaquin. Rx Advair BID. Continue Albuterol. Tussionex for cough. Increase fluids. Rest. Return precautions discussed.

## 2015-08-10 ENCOUNTER — Other Ambulatory Visit: Payer: Self-pay | Admitting: Internal Medicine

## 2015-09-14 ENCOUNTER — Telehealth: Payer: Self-pay

## 2015-09-14 NOTE — Telephone Encounter (Signed)
Pre visit call completed. Patient unsure about procedure dates.

## 2015-09-15 ENCOUNTER — Ambulatory Visit (INDEPENDENT_AMBULATORY_CARE_PROVIDER_SITE_OTHER): Payer: BLUE CROSS/BLUE SHIELD | Admitting: Family Medicine

## 2015-09-15 ENCOUNTER — Encounter: Payer: Self-pay | Admitting: Family Medicine

## 2015-09-15 VITALS — BP 132/86 | HR 99 | Temp 98.7°F | Ht 61.0 in | Wt 146.4 lb

## 2015-09-15 DIAGNOSIS — E876 Hypokalemia: Secondary | ICD-10-CM | POA: Diagnosis not present

## 2015-09-15 DIAGNOSIS — Z1159 Encounter for screening for other viral diseases: Secondary | ICD-10-CM | POA: Diagnosis not present

## 2015-09-15 DIAGNOSIS — E782 Mixed hyperlipidemia: Secondary | ICD-10-CM

## 2015-09-15 DIAGNOSIS — Z0000 Encounter for general adult medical examination without abnormal findings: Secondary | ICD-10-CM

## 2015-09-15 DIAGNOSIS — D649 Anemia, unspecified: Secondary | ICD-10-CM

## 2015-09-15 DIAGNOSIS — K219 Gastro-esophageal reflux disease without esophagitis: Secondary | ICD-10-CM

## 2015-09-15 DIAGNOSIS — Z8619 Personal history of other infectious and parasitic diseases: Secondary | ICD-10-CM | POA: Insufficient documentation

## 2015-09-15 DIAGNOSIS — F32A Depression, unspecified: Secondary | ICD-10-CM

## 2015-09-15 DIAGNOSIS — Z23 Encounter for immunization: Secondary | ICD-10-CM

## 2015-09-15 DIAGNOSIS — Z720 Tobacco use: Secondary | ICD-10-CM

## 2015-09-15 DIAGNOSIS — F329 Major depressive disorder, single episode, unspecified: Secondary | ICD-10-CM

## 2015-09-15 DIAGNOSIS — I10 Essential (primary) hypertension: Secondary | ICD-10-CM

## 2015-09-15 DIAGNOSIS — Z1239 Encounter for other screening for malignant neoplasm of breast: Secondary | ICD-10-CM

## 2015-09-15 LAB — LIPID PANEL
Cholesterol: 240 mg/dL — ABNORMAL HIGH (ref 0–200)
HDL: 59.2 mg/dL (ref >39.00)
LDL CALC: 141 mg/dL — AB (ref 0–99)
NonHDL: 180.88
Total CHOL/HDL Ratio: 4
Triglycerides: 200 mg/dL — ABNORMAL HIGH (ref 0.0–149.0)
VLDL: 40 mg/dL (ref 0.0–40.0)

## 2015-09-15 LAB — COMPREHENSIVE METABOLIC PANEL
ALBUMIN: 4.3 g/dL (ref 3.5–5.2)
ALK PHOS: 74 U/L (ref 39–117)
ALT: 14 U/L (ref 0–35)
AST: 12 U/L (ref 0–37)
BILIRUBIN TOTAL: 0.2 mg/dL (ref 0.2–1.2)
BUN: 14 mg/dL (ref 6–23)
CHLORIDE: 103 meq/L (ref 96–112)
CO2: 28 mEq/L (ref 19–32)
Calcium: 9.7 mg/dL (ref 8.4–10.5)
Creatinine, Ser: 0.72 mg/dL (ref 0.40–1.20)
GFR: 87.57 mL/min (ref >60.00)
Glucose, Bld: 74 mg/dL (ref 70–99)
Potassium: 3.8 mEq/L (ref 3.5–5.1)
SODIUM: 139 meq/L (ref 135–145)
TOTAL PROTEIN: 7 g/dL (ref 6.0–8.3)

## 2015-09-15 LAB — CBC
HEMATOCRIT: 38 % (ref 36.0–46.0)
HEMOGLOBIN: 12.4 g/dL (ref 12.0–15.0)
MCHC: 32.5 g/dL (ref 30.0–36.0)
MCV: 90.3 fl (ref 78.0–100.0)
PLATELETS: 349 10*3/uL (ref 150.0–400.0)
RBC: 4.2 Mil/uL (ref 3.87–5.11)
RDW: 15 % (ref 11.5–15.5)
WBC: 7.4 10*3/uL (ref 4.0–10.5)

## 2015-09-15 LAB — TSH: TSH: 1.94 u[IU]/mL (ref 0.35–4.50)

## 2015-09-15 MED ORDER — SERTRALINE HCL 100 MG PO TABS
100.0000 mg | ORAL_TABLET | Freq: Every day | ORAL | Status: DC
Start: 2015-09-15 — End: 2016-06-15

## 2015-09-15 MED ORDER — LANSOPRAZOLE 30 MG PO CPDR: DELAYED_RELEASE_CAPSULE | ORAL | Status: DC

## 2015-09-15 NOTE — Progress Notes (Signed)
Pre visit review using our clinic review tool, if applicable. No additional management support is needed unless otherwise documented below in the visit note. 

## 2015-09-15 NOTE — Patient Instructions (Addendum)
Encouraged increased rest and hydration, add probiotics, zinc such as Coldeze or Xicam. Treat fevers as needed. Mucinex twice daily x 10 days, Elderberry liquid as needed for cough  Call insurance and confirm they will pay for Cologuard for colon cancer screen as recommended by you physician  Preventive Care for Adults, Female A healthy lifestyle and preventive care can promote health and wellness. Preventive health guidelines for women include the following key practices.  A routine yearly physical is a good way to check with your health care provider about your health and preventive screening. It is a chance to share any concerns and updates on your health and to receive a thorough exam.  Visit your dentist for a routine exam and preventive care every 6 months. Brush your teeth twice a day and floss once a day. Good oral hygiene prevents tooth decay and gum disease.  The frequency of eye exams is based on your age, health, family medical history, use of contact lenses, and other factors. Follow your health care provider's recommendations for frequency of eye exams.  Eat a healthy diet. Foods like vegetables, fruits, whole grains, low-fat dairy products, and lean protein foods contain the nutrients you need without too many calories. Decrease your intake of foods high in solid fats, added sugars, and salt. Eat the right amount of calories for you.Get information about a proper diet from your health care provider, if necessary.  Regular physical exercise is one of the most important things you can do for your health. Most adults should get at least 150 minutes of moderate-intensity exercise (any activity that increases your heart rate and causes you to sweat) each week. In addition, most adults need muscle-strengthening exercises on 2 or more days a week.  Maintain a healthy weight. The body mass index (BMI) is a screening tool to identify possible weight problems. It provides an estimate of body  fat based on height and weight. Your health care provider can find your BMI and can help you achieve or maintain a healthy weight.For adults 20 years and older:  A BMI below 18.5 is considered underweight.  A BMI of 18.5 to 24.9 is normal.  A BMI of 25 to 29.9 is considered overweight.  A BMI of 30 and above is considered obese.  Maintain normal blood lipids and cholesterol levels by exercising and minimizing your intake of saturated fat. Eat a balanced diet with plenty of fruit and vegetables. Blood tests for lipids and cholesterol should begin at age 21 and be repeated every 5 years. If your lipid or cholesterol levels are high, you are over 50, or you are at high risk for heart disease, you may need your cholesterol levels checked more frequently.Ongoing high lipid and cholesterol levels should be treated with medicines if diet and exercise are not working.  If you smoke, find out from your health care provider how to quit. If you do not use tobacco, do not start.  Lung cancer screening is recommended for adults aged 23-80 years who are at high risk for developing lung cancer because of a history of smoking. A yearly low-dose CT scan of the lungs is recommended for people who have at least a 30-pack-year history of smoking and are a current smoker or have quit within the past 15 years. A pack year of smoking is smoking an average of 1 pack of cigarettes a day for 1 year (for example: 1 pack a day for 30 years or 2 packs a day for 15  years). Yearly screening should continue until the smoker has stopped smoking for at least 15 years. Yearly screening should be stopped for people who develop a health problem that would prevent them from having lung cancer treatment.  If you are pregnant, do not drink alcohol. If you are breastfeeding, be very cautious about drinking alcohol. If you are not pregnant and choose to drink alcohol, do not have more than 1 drink per day. One drink is considered to be 12  ounces (355 mL) of beer, 5 ounces (148 mL) of wine, or 1.5 ounces (44 mL) of liquor.  Avoid use of street drugs. Do not share needles with anyone. Ask for help if you need support or instructions about stopping the use of drugs.  High blood pressure causes heart disease and increases the risk of stroke. Your blood pressure should be checked at least every 1 to 2 years. Ongoing high blood pressure should be treated with medicines if weight loss and exercise do not work.  If you are 37-72 years old, ask your health care provider if you should take aspirin to prevent strokes.  Diabetes screening is done by taking a blood sample to check your blood glucose level after you have not eaten for a certain period of time (fasting). If you are not overweight and you do not have risk factors for diabetes, you should be screened once every 3 years starting at age 91. If you are overweight or obese and you are 17-80 years of age, you should be screened for diabetes every year as part of your cardiovascular risk assessment.  Breast cancer screening is essential preventive care for women. You should practice "breast self-awareness." This means understanding the normal appearance and feel of your breasts and may include breast self-examination. Any changes detected, no matter how small, should be reported to a health care provider. Women in their 54s and 30s should have a clinical breast exam (CBE) by a health care provider as part of a regular health exam every 1 to 3 years. After age 58, women should have a CBE every year. Starting at age 25, women should consider having a mammogram (breast X-ray test) every year. Women who have a family history of breast cancer should talk to their health care provider about genetic screening. Women at a high risk of breast cancer should talk to their health care providers about having an MRI and a mammogram every year.  Breast cancer gene (BRCA)-related cancer risk assessment is  recommended for women who have family members with BRCA-related cancers. BRCA-related cancers include breast, ovarian, tubal, and peritoneal cancers. Having family members with these cancers may be associated with an increased risk for harmful changes (mutations) in the breast cancer genes BRCA1 and BRCA2. Results of the assessment will determine the need for genetic counseling and BRCA1 and BRCA2 testing.  Your health care provider may recommend that you be screened regularly for cancer of the pelvic organs (ovaries, uterus, and vagina). This screening involves a pelvic examination, including checking for microscopic changes to the surface of your cervix (Pap test). You may be encouraged to have this screening done every 3 years, beginning at age 23.  For women ages 64-65, health care providers may recommend pelvic exams and Pap testing every 3 years, or they may recommend the Pap and pelvic exam, combined with testing for human papilloma virus (HPV), every 5 years. Some types of HPV increase your risk of cervical cancer. Testing for HPV may also be done on  women of any age with unclear Pap test results.  Other health care providers may not recommend any screening for nonpregnant women who are considered low risk for pelvic cancer and who do not have symptoms. Ask your health care provider if a screening pelvic exam is right for you.  If you have had past treatment for cervical cancer or a condition that could lead to cancer, you need Pap tests and screening for cancer for at least 20 years after your treatment. If Pap tests have been discontinued, your risk factors (such as having a new sexual partner) need to be reassessed to determine if screening should resume. Some women have medical problems that increase the chance of getting cervical cancer. In these cases, your health care provider may recommend more frequent screening and Pap tests.  Colorectal cancer can be detected and often prevented. Most  routine colorectal cancer screening begins at the age of 66 years and continues through age 50 years. However, your health care provider may recommend screening at an earlier age if you have risk factors for colon cancer. On a yearly basis, your health care provider may provide home test kits to check for hidden blood in the stool. Use of a small camera at the end of a tube, to directly examine the colon (sigmoidoscopy or colonoscopy), can detect the earliest forms of colorectal cancer. Talk to your health care provider about this at age 22, when routine screening begins. Direct exam of the colon should be repeated every 5-10 years through age 3 years, unless early forms of precancerous polyps or small growths are found.  People who are at an increased risk for hepatitis B should be screened for this virus. You are considered at high risk for hepatitis B if:  You were born in a country where hepatitis B occurs often. Talk with your health care provider about which countries are considered high risk.  Your parents were born in a high-risk country and you have not received a shot to protect against hepatitis B (hepatitis B vaccine).  You have HIV or AIDS.  You use needles to inject street drugs.  You live with, or have sex with, someone who has hepatitis B.  You get hemodialysis treatment.  You take certain medicines for conditions like cancer, organ transplantation, and autoimmune conditions.  Hepatitis C blood testing is recommended for all people born from [birth year] through 1965 and any individual with known risks for hepatitis C.  Practice safe sex. Use condoms and avoid high-risk sexual practices to reduce the spread of sexually transmitted infections (STIs). STIs include gonorrhea, chlamydia, syphilis, trichomonas, herpes, HPV, and human immunodeficiency virus (HIV). Herpes, HIV, and HPV are viral illnesses that have no cure. They can result in disability, cancer, and death.  You should be  screened for sexually transmitted illnesses (STIs) including gonorrhea and chlamydia if:  You are sexually active and are younger than 24 years.  You are older than 24 years and your health care provider tells you that you are at risk for this type of infection.  Your sexual activity has changed since you were last screened and you are at an increased risk for chlamydia or gonorrhea. Ask your health care provider if you are at risk.  If you are at risk of being infected with HIV, it is recommended that you take a prescription medicine daily to prevent HIV infection. This is called preexposure prophylaxis (PrEP). You are considered at risk if:  You are sexually active and do  not regularly use condoms or know the HIV status of your partner(s).  You take drugs by injection.  You are sexually active with a partner who has HIV.  Talk with your health care provider about whether you are at high risk of being infected with HIV. If you choose to begin PrEP, you should first be tested for HIV. You should then be tested every 3 months for as long as you are taking PrEP.  Osteoporosis is a disease in which the bones lose minerals and strength with aging. This can result in serious bone fractures or breaks. The risk of osteoporosis can be identified using a bone density scan. Women ages 66 years and over and women at risk for fractures or osteoporosis should discuss screening with their health care providers. Ask your health care provider whether you should take a calcium supplement or vitamin D to reduce the rate of osteoporosis.  Menopause can be associated with physical symptoms and risks. Hormone replacement therapy is available to decrease symptoms and risks. You should talk to your health care provider about whether hormone replacement therapy is right for you.  Use sunscreen. Apply sunscreen liberally and repeatedly throughout the day. You should seek shade when your shadow is shorter than you.  Protect yourself by wearing long sleeves, pants, a wide-brimmed hat, and sunglasses year round, whenever you are outdoors.  Once a month, do a whole body skin exam, using a mirror to look at the skin on your back. Tell your health care provider of new moles, moles that have irregular borders, moles that are larger than a pencil eraser, or moles that have changed in shape or color.  Stay current with required vaccines (immunizations).  Influenza vaccine. All adults should be immunized every year.  Tetanus, diphtheria, and acellular pertussis (Td, Tdap) vaccine. Pregnant women should receive 1 dose of Tdap vaccine during each pregnancy. The dose should be obtained regardless of the length of time since the last dose. Immunization is preferred during the 27th-36th week of gestation. An adult who has not previously received Tdap or who does not know her vaccine status should receive 1 dose of Tdap. This initial dose should be followed by tetanus and diphtheria toxoids (Td) booster doses every 10 years. Adults with an unknown or incomplete history of completing a 3-dose immunization series with Td-containing vaccines should begin or complete a primary immunization series including a Tdap dose. Adults should receive a Td booster every 10 years.  Varicella vaccine. An adult without evidence of immunity to varicella should receive 2 doses or a second dose if she has previously received 1 dose. Pregnant females who do not have evidence of immunity should receive the first dose after pregnancy. This first dose should be obtained before leaving the health care facility. The second dose should be obtained 4-8 weeks after the first dose.  Human papillomavirus (HPV) vaccine. Females aged 13-26 years who have not received the vaccine previously should obtain the 3-dose series. The vaccine is not recommended for use in pregnant females. However, pregnancy testing is not needed before receiving a dose. If a female is  found to be pregnant after receiving a dose, no treatment is needed. In that case, the remaining doses should be delayed until after the pregnancy. Immunization is recommended for any person with an immunocompromised condition through the age of 73 years if she did not get any or all doses earlier. During the 3-dose series, the second dose should be obtained 4-8 weeks after the  first dose. The third dose should be obtained 24 weeks after the first dose and 16 weeks after the second dose.  Zoster vaccine. One dose is recommended for adults aged 56 years or older unless certain conditions are present.  Measles, mumps, and rubella (MMR) vaccine. Adults born before [birth year] generally are considered immune to measles and mumps. Adults born in [birth year] or later should have 1 or more doses of MMR vaccine unless there is a contraindication to the vaccine or there is laboratory evidence of immunity to each of the three diseases. A routine second dose of MMR vaccine should be obtained at least 28 days after the first dose for students attending postsecondary schools, health care workers, or international travelers. People who received inactivated measles vaccine or an unknown type of measles vaccine during 1963-1967 should receive 2 doses of MMR vaccine. People who received inactivated mumps vaccine or an unknown type of mumps vaccine before 1979 and are at high risk for mumps infection should consider immunization with 2 doses of MMR vaccine. For females of childbearing age, rubella immunity should be determined. If there is no evidence of immunity, females who are not pregnant should be vaccinated. If there is no evidence of immunity, females who are pregnant should delay immunization until after pregnancy. Unvaccinated health care workers born before [birth year] who lack laboratory evidence of measles, mumps, or rubella immunity or laboratory confirmation of disease should consider measles and mumps immunization with 2 doses of MMR  vaccine or rubella immunization with 1 dose of MMR vaccine.  Pneumococcal 13-valent conjugate (PCV13) vaccine. When indicated, a person who is uncertain of his immunization history and has no record of immunization should receive the PCV13 vaccine. All adults 83 years of age and older should receive this vaccine. An adult aged 81 years or older who has certain medical conditions and has not been previously immunized should receive 1 dose of PCV13 vaccine. This PCV13 should be followed with a dose of pneumococcal polysaccharide (PPSV23) vaccine. Adults who are at high risk for pneumococcal disease should obtain the PPSV23 vaccine at least 8 weeks after the dose of PCV13 vaccine. Adults older than 60 years of age who have normal immune system function should obtain the PPSV23 vaccine dose at least 1 year after the dose of PCV13 vaccine.  Pneumococcal polysaccharide (PPSV23) vaccine. When PCV13 is also indicated, PCV13 should be obtained first. All adults aged 59 years and older should be immunized. An adult younger than age 29 years who has certain medical conditions should be immunized. Any person who resides in a nursing home or long-term care facility should be immunized. An adult smoker should be immunized. People with an immunocompromised condition and certain other conditions should receive both PCV13 and PPSV23 vaccines. People with human immunodeficiency virus (HIV) infection should be immunized as soon as possible after diagnosis. Immunization during chemotherapy or radiation therapy should be avoided. Routine use of PPSV23 vaccine is not recommended for American Indians, [place] Natives, or people younger than 65 years unless there are medical conditions that require PPSV23 vaccine. When indicated, people who have unknown immunization and have no record of immunization should receive PPSV23 vaccine. One-time revaccination 5 years after the first dose of PPSV23 is recommended for people aged 19-64 years  who have chronic kidney failure, nephrotic syndrome, asplenia, or immunocompromised conditions. People who received 1-2 doses of PPSV23 before age 40 years should receive another dose of PPSV23 vaccine at age 53 years or later if at least 5 years  have passed since the previous dose. Doses of PPSV23 are not needed for people immunized with PPSV23 at or after age 47 years.  Meningococcal vaccine. Adults with asplenia or persistent complement component deficiencies should receive 2 doses of quadrivalent meningococcal conjugate (MenACWY-D) vaccine. The doses should be obtained at least 2 months apart. Microbiologists working with certain meningococcal bacteria, [employer] recruits, people at risk during an outbreak, and people who travel to or live in countries with a high rate of meningitis should be immunized. A first-year college student up through age 57 years who is living in a residence hall should receive a dose if she did not receive a dose on or after her 16th birthday. Adults who have certain high-risk conditions should receive one or more doses of vaccine.  Hepatitis A vaccine. Adults who wish to be protected from this disease, have certain high-risk conditions, work with hepatitis A-infected animals, work in hepatitis A research labs, or travel to or work in countries with a high rate of hepatitis A should be immunized. Adults who were previously unvaccinated and who anticipate close contact with an international adoptee during the first 60 days after arrival in the [place] from a country with a high rate of hepatitis A should be immunized.  Hepatitis B vaccine. Adults who wish to be protected from this disease, have certain high-risk conditions, may be exposed to blood or other infectious body fluids, are household contacts or sex partners of hepatitis B positive people, are clients or workers in certain care facilities, or travel to or work in countries with a high rate of hepatitis B should be  immunized.  Haemophilus influenzae type b (Hib) vaccine. A previously unvaccinated person with asplenia or sickle cell disease or having a scheduled splenectomy should receive 1 dose of Hib vaccine. Regardless of previous immunization, a recipient of a hematopoietic stem cell transplant should receive a 3-dose series 6-12 months after her successful transplant. Hib vaccine is not recommended for adults with HIV infection. Preventive Services / Frequency Ages 74 to 52 years  Blood pressure check.** / Every 3-5 years.  Lipid and cholesterol check.** / Every 5 years beginning at age 67.  Clinical breast exam.** / Every 3 years for women in their 68s and 14s.  BRCA-related cancer risk assessment.** / For women who have family members with a BRCA-related cancer (breast, ovarian, tubal, or peritoneal cancers).  Pap test.** / Every 2 years from ages 4 through 59. Every 3 years starting at age 48 through age 20 or 67 with a history of 3 consecutive normal Pap tests.  HPV screening.** / Every 3 years from ages 44 through ages 2 to 45 with a history of 3 consecutive normal Pap tests.  Hepatitis C blood test.** / For any individual with known risks for hepatitis C.  Skin self-exam. / Monthly.  Influenza vaccine. / Every year.  Tetanus, diphtheria, and acellular pertussis (Tdap, Td) vaccine.** / Consult your health care provider. Pregnant women should receive 1 dose of Tdap vaccine during each pregnancy. 1 dose of Td every 10 years.  Varicella vaccine.** / Consult your health care provider. Pregnant females who do not have evidence of immunity should receive the first dose after pregnancy.  HPV vaccine. / 3 doses over 6 months, if 92 and younger. The vaccine is not recommended for use in pregnant females. However, pregnancy testing is not needed before receiving a dose.  Measles, mumps, rubella (MMR) vaccine.** / You need at least 1 dose of MMR if  you were born in [birth year] or later. You may also need  a 2nd dose. For females of childbearing age, rubella immunity should be determined. If there is no evidence of immunity, females who are not pregnant should be vaccinated. If there is no evidence of immunity, females who are pregnant should delay immunization until after pregnancy.  Pneumococcal 13-valent conjugate (PCV13) vaccine.** / Consult your health care provider.  Pneumococcal polysaccharide (PPSV23) vaccine.** / 1 to 2 doses if you smoke cigarettes or if you have certain conditions.  Meningococcal vaccine.** / 1 dose if you are age 75 to 70 years and a Market researcher living in a residence hall, or have one of several medical conditions, you need to get vaccinated against meningococcal disease. You may also need additional booster doses.  Hepatitis A vaccine.** / Consult your health care provider.  Hepatitis B vaccine.** / Consult your health care provider.  Haemophilus influenzae type b (Hib) vaccine.** / Consult your health care provider. Ages 54 to 42 years  Blood pressure check.** / Every year.  Lipid and cholesterol check.** / Every 5 years beginning at age 70 years.  Lung cancer screening. / Every year if you are aged 71-80 years and have a 30-pack-year history of smoking and currently smoke or have quit within the past 15 years. Yearly screening is stopped once you have quit smoking for at least 15 years or develop a health problem that would prevent you from having lung cancer treatment.  Clinical breast exam.** / Every year after age 5 years.  BRCA-related cancer risk assessment.** / For women who have family members with a BRCA-related cancer (breast, ovarian, tubal, or peritoneal cancers).  Mammogram.** / Every year beginning at age 82 years and continuing for as long as you are in good health. Consult with your health care provider.  Pap test.** / Every 3 years starting at age 47 years through age 71 or 45 years with a history of 3 consecutive normal Pap  tests.  HPV screening.** / Every 3 years from ages 66 years through ages 28 to 60 years with a history of 3 consecutive normal Pap tests.  Fecal occult blood test (FOBT) of stool. / Every year beginning at age 38 years and continuing until age 53 years. You may not need to do this test if you get a colonoscopy every 10 years.  Flexible sigmoidoscopy or colonoscopy.** / Every 5 years for a flexible sigmoidoscopy or every 10 years for a colonoscopy beginning at age 91 years and continuing until age 48 years.  Hepatitis C blood test.** / For all people born from [birth year] through 1966 and any individual with known risks for hepatitis C.  Skin self-exam. / Monthly.  Influenza vaccine. / Every year.  Tetanus, diphtheria, and acellular pertussis (Tdap/Td) vaccine.** / Consult your health care provider. Pregnant women should receive 1 dose of Tdap vaccine during each pregnancy. 1 dose of Td every 10 years.  Varicella vaccine.** / Consult your health care provider. Pregnant females who do not have evidence of immunity should receive the first dose after pregnancy.  Zoster vaccine.** / 1 dose for adults aged 54 years or older.  Measles, mumps, rubella (MMR) vaccine.** / You need at least 1 dose of MMR if you were born in 1957 or later. You may also need a second dose. For females of childbearing age, rubella immunity should be determined. If there is no evidence of immunity, females who are not pregnant should be vaccinated. If there is no  evidence of immunity, females who are pregnant should delay immunization until after pregnancy.  Pneumococcal 13-valent conjugate (PCV13) vaccine.** / Consult your health care provider.  Pneumococcal polysaccharide (PPSV23) vaccine.** / 1 to 2 doses if you smoke cigarettes or if you have certain conditions.  Meningococcal vaccine.** / Consult your health care provider.  Hepatitis A vaccine.** / Consult your health care provider.  Hepatitis B vaccine.** / Consult  your health care provider.  Haemophilus influenzae type b (Hib) vaccine.** / Consult your health care provider. Ages 77 years and over  Blood pressure check.** / Every year.  Lipid and cholesterol check.** / Every 5 years beginning at age 40 years.  Lung cancer screening. / Every year if you are aged 12-80 years and have a 30-pack-year history of smoking and currently smoke or have quit within the past 15 years. Yearly screening is stopped once you have quit smoking for at least 15 years or develop a health problem that would prevent you from having lung cancer treatment.  Clinical breast exam.** / Every year after age 71 years.  BRCA-related cancer risk assessment.** / For women who have family members with a BRCA-related cancer (breast, ovarian, tubal, or peritoneal cancers).  Mammogram.** / Every year beginning at age 67 years and continuing for as long as you are in good health. Consult with your health care provider.  Pap test.** / Every 3 years starting at age 10 years through age 45 or 57 years with 3 consecutive normal Pap tests. Testing can be stopped between 65 and 70 years with 3 consecutive normal Pap tests and no abnormal Pap or HPV tests in the past 10 years.  HPV screening.** / Every 3 years from ages 48 years through ages 72 or 33 years with a history of 3 consecutive normal Pap tests. Testing can be stopped between 65 and 70 years with 3 consecutive normal Pap tests and no abnormal Pap or HPV tests in the past 10 years.  Fecal occult blood test (FOBT) of stool. / Every year beginning at age 1 years and continuing until age 45 years. You may not need to do this test if you get a colonoscopy every 10 years.  Flexible sigmoidoscopy or colonoscopy.** / Every 5 years for a flexible sigmoidoscopy or every 10 years for a colonoscopy beginning at age 39 years and continuing until age 3 years.  Hepatitis C blood test.** / For all people born from [birth year] through 1966 and any  individual with known risks for hepatitis C.  Osteoporosis screening.** / A one-time screening for women ages 87 years and over and women at risk for fractures or osteoporosis.  Skin self-exam. / Monthly.  Influenza vaccine. / Every year.  Tetanus, diphtheria, and acellular pertussis (Tdap/Td) vaccine.** / 1 dose of Td every 10 years.  Varicella vaccine.** / Consult your health care provider.  Zoster vaccine.** / 1 dose for adults aged 54 years or older.  Pneumococcal 13-valent conjugate (PCV13) vaccine.** / Consult your health care provider.  Pneumococcal polysaccharide (PPSV23) vaccine.** / 1 dose for all adults aged 52 years and older.  Meningococcal vaccine.** / Consult your health care provider.  Hepatitis A vaccine.** / Consult your health care provider.  Hepatitis B vaccine.** / Consult your health care provider.  Haemophilus influenzae type b (Hib) vaccine.** / Consult your health care provider. ** Family history and personal history of risk and conditions may change your health care provider's recommendations.   This information is not intended to replace advice given to  you by your health care provider. Make sure you discuss any questions you have with your health care provider.   Document Released: 12/11/2001 Document Revised: 11/05/2014 Document Reviewed: 03/12/2011 Elsevier Interactive Patient Education [employer].

## 2015-09-16 LAB — HEPATITIS C ANTIBODY: HCV AB: NEGATIVE

## 2015-09-16 LAB — HIV ANTIBODY (ROUTINE TESTING W REFLEX): HIV 1&2 Ab, 4th Generation: NONREACTIVE

## 2015-09-20 ENCOUNTER — Ambulatory Visit (HOSPITAL_BASED_OUTPATIENT_CLINIC_OR_DEPARTMENT_OTHER)
Admission: RE | Admit: 2015-09-20 | Discharge: 2015-09-20 | Disposition: A | Discharge status: Home / Self Care | Payer: BLUE CROSS/BLUE SHIELD | Source: Ambulatory Visit | Attending: Family Medicine | Admitting: Family Medicine

## 2015-09-20 DIAGNOSIS — Z1231 Encounter for screening mammogram for malignant neoplasm of breast: Secondary | ICD-10-CM | POA: Diagnosis not present

## 2015-09-20 DIAGNOSIS — Z1239 Encounter for other screening for malignant neoplasm of breast: Secondary | ICD-10-CM

## 2015-09-24 ENCOUNTER — Encounter: Payer: Self-pay | Admitting: Family Medicine

## 2015-09-24 DIAGNOSIS — F32A Depression, unspecified: Secondary | ICD-10-CM | POA: Insufficient documentation

## 2015-09-24 DIAGNOSIS — E782 Mixed hyperlipidemia: Secondary | ICD-10-CM

## 2015-09-24 DIAGNOSIS — F329 Major depressive disorder, single episode, unspecified: Secondary | ICD-10-CM | POA: Insufficient documentation

## 2015-09-24 HISTORY — DX: Depression, unspecified: F32.A

## 2015-09-24 HISTORY — DX: Mixed hyperlipidemia: E78.2

## 2015-09-24 NOTE — Assessment & Plan Note (Signed)
Encouraged complete cessation. Discussed need to quit as relates to risk of numerous cancers, cardiac and pulmonary disease as well as neurologic complications. Counseled for greater than 3 minutes 

## 2015-09-24 NOTE — Progress Notes (Signed)
Subjective:    Patient ID: Morgan Harper, female    DOB: 1956-01-24, 59 y.o.   MRN: [id number]  Chief Complaint  Patient presents with  . Establish Care    HPI Patient is in today for new patient appointment and evaluation of numerous concerns. She is noting ongoing stressors and worsening depression. She technologist anhedonia and fatigue but denies suicidal ideation. Does feel that sertraline has helped her historically but is questioning whether it helps enough at this time. No recent illness at this time. No fevers or chills. Struggles with heartburn occasionally but with diet changes and lansoprazole her symptoms are relatively well controlled. Denies CP/palp/SOB/HA/congestion/fevers or GU c/o. Taking meds as prescribed  Past Medical History  Diagnosis Date  . Hypertension   . Insomnia   . Hiatal hernia   . GERD (gastroesophageal reflux disease)   . History of chicken pox   . H/O measles   . H/O mumps   . Hyperlipidemia, mixed 09/24/2015  . Depression 09/24/2015    Past Surgical History  Procedure Laterality Date  . Tonsillectomy      Family History  Problem Relation Age of Onset  . Hypertension Mother   . Allergies Mother   . Asthma Mother   . COPD Mother     previous smoker  . Heart disease Neg Hx   . Diabetes Sister   . Alzheimer's disease Maternal Grandfather     Social History   Social History  . Marital Status: Married    Spouse Name: N/A  . Number of Children: N/A  . Years of Education: N/A   Occupational History  . Not on file.   Social History Main Topics  . Smoking status: Current Some Day Smoker -- 1.00 packs/day for 45 years    Types: Cigarettes  . Smokeless tobacco: Not on file  . Alcohol Use: No  . Drug Use: No  . 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 Not on file   Social History Narrative    Outpatient Prescriptions Prior  to Visit  Medication Sig Dispense Refill  . lansoprazole (PREVACID) 30 MG capsule TAKE 1 CAPSULE (30 MG TOTAL) BY MOUTH DAILY. 30 capsule 4  . sertraline (ZOLOFT) 50 MG tablet TAKE 1 TABLET (50 MG TOTAL) BY MOUTH DAILY. 90 tablet 3  . albuterol (PROVENTIL HFA;VENTOLIN HFA) 108 (90 BASE) MCG/ACT inhaler Inhale 2 puffs into the lungs every 6 (six) hours as needed for wheezing or shortness of breath. (Patient not taking: Reported on 09/14/2015) 1 Inhaler 0  . benzonatate (TESSALON) 200 MG capsule Take 1 capsule (200 mg total) by mouth 3 (three) times daily as needed for cough. (Patient not taking: Reported on 09/14/2015) 30 capsule 0  . chlorpheniramine-HYDROcodone (TUSSIONEX PENNKINETIC ER) 10-8 MG/5ML SUER Take 5 mLs by mouth every 12 (twelve) hours as needed. (Patient not taking: Reported on 09/14/2015) 140 mL 0  . fluticasone (FLONASE) 50 MCG/ACT nasal spray Place 2 sprays into both nostrils daily. (Patient not taking: Reported on 09/14/2015) 16 g 0  . Fluticasone-Salmeterol (ADVAIR DISKUS) 250-50 MCG/DOSE AEPB Inhale 1 puff into the lungs 2 (two) times daily. (Patient not taking: Reported on 09/14/2015) 1 each 3  . levofloxacin (LEVAQUIN) 500 MG tablet Take 1 tablet (500 mg total) by mouth daily. (Patient not taking: Reported on 09/14/2015) 7 tablet 0  . lisinopril (PRINIVIL,ZESTRIL) 10 MG tablet Take 1  tablet (10 mg total) by mouth daily. (Patient not taking: Reported on 09/14/2015) 30 tablet 1   No facility-administered medications prior to visit.    No Known Allergies  Review of Systems  Constitutional: Negative for fever, chills and malaise/fatigue.  HENT: Negative for congestion and hearing loss.   Eyes: Negative for discharge.  Respiratory: Negative for cough, sputum production and shortness of breath.   Cardiovascular: Negative for chest pain, palpitations and leg swelling.  Gastrointestinal: Positive for heartburn. Negative for nausea, vomiting, abdominal pain, diarrhea, constipation  and blood in stool.  Genitourinary: Negative for dysuria, urgency, frequency and hematuria.  Musculoskeletal: Negative for myalgias, back pain and falls.  Skin: Negative for rash.  Neurological: Negative for dizziness, sensory change, loss of consciousness, weakness and headaches.  Endo/Heme/Allergies: Negative for environmental allergies. Does not bruise/bleed easily.  Psychiatric/Behavioral: Positive for depression. Negative for suicidal ideas. The patient is not nervous/anxious and does not have insomnia.        Objective:    Physical Exam  Constitutional: She is oriented to person, place, and time. She appears well-developed and well-nourished. No distress.  HENT:  Head: Normocephalic and atraumatic.  Eyes: Conjunctivae are normal.  Neck: Neck supple. No thyromegaly present.  Cardiovascular: Normal rate, regular rhythm and normal heart sounds.   No murmur heard. Pulmonary/Chest: Effort normal and breath sounds normal. No respiratory distress.  Abdominal: Soft. Bowel sounds are normal. She exhibits no distension and no mass. There is no tenderness.  Musculoskeletal: She exhibits no edema.  Lymphadenopathy:    She has no cervical adenopathy.  Neurological: She is alert and oriented to person, place, and time.  Skin: Skin is warm and dry.  Psychiatric: She has a normal mood and affect. Her behavior is normal.    BP 132/86 mmHg  Pulse 99  Temp(Src) 98.7 F (37.1 C) (Oral)  Ht  (1.549 m)  Wt 146 lb 6 oz (66.395 kg)  BMI 27.67 kg/m2  SpO2 97% Wt Readings from Last 3 Encounters:  09/15/15 146 lb 6 oz (66.395 kg)  08/05/15 144 lb 2 oz (65.375 kg)  07/24/15 130 lb (58.968 kg)     Lab Results  Component Value Date   WBC 7.4 09/15/2015   HGB 12.4 09/15/2015   HCT 38.0 09/15/2015   PLT 349.0 09/15/2015   GLUCOSE 74 09/15/2015   CHOL 240* 09/15/2015   TRIG 200.0* 09/15/2015   HDL 59.20 09/15/2015   LDLCALC 141* 09/15/2015   ALT 14 09/15/2015   AST 12 09/15/2015    NA 139 09/15/2015   K 3.8 09/15/2015   CL 103 09/15/2015   CREATININE 0.72 09/15/2015   BUN 14 09/15/2015   CO2 28 09/15/2015   TSH 1.94 09/15/2015    Lab Results  Component Value Date   TSH 1.94 09/15/2015   Lab Results  Component Value Date   WBC 7.4 09/15/2015   HGB 12.4 09/15/2015   HCT 38.0 09/15/2015   MCV 90.3 09/15/2015   PLT 349.0 09/15/2015   Lab Results  Component Value Date   NA 139 09/15/2015   K 3.8 09/15/2015   CO2 28 09/15/2015   GLUCOSE 74 09/15/2015   BUN 14 09/15/2015   CREATININE 0.72 09/15/2015   BILITOT 0.2 09/15/2015   ALKPHOS 74 09/15/2015   AST 12 09/15/2015   ALT 14 09/15/2015   PROT 7.0 09/15/2015   ALBUMIN 4.3 09/15/2015   CALCIUM 9.7 09/15/2015   ANIONGAP 7 07/24/2015   GFR 87.57 09/15/2015   Lab  Results  Component Value Date   CHOL 240* 09/15/2015   Lab Results  Component Value Date   HDL 59.20 09/15/2015   Lab Results  Component Value Date   LDLCALC 141* 09/15/2015   Lab Results  Component Value Date   TRIG 200.0* 09/15/2015   Lab Results  Component Value Date   CHOLHDL 4 09/15/2015   No results found for: HGBA1C     Assessment & Plan:   Problem List Items Addressed This Visit    Depression    Increase Sertraline to 100 mg daily      Relevant Medications   sertraline (ZOLOFT) 100 MG tablet   Essential hypertension    Well controlled, no changes to meds. Encouraged heart healthy diet such as the DASH diet and exercise as tolerated.       GERD    Avoid offending foods, start probiotics. Do not eat large meals in late evening and consider raising head of bed. May continue Prevacid prn but encouraged to minimize if possible      Relevant Medications   lansoprazole (PREVACID) 30 MG capsule   Hyperlipidemia, mixed    Encouraged heart healthy diet, increase exercise, avoid trans fats, consider a krill oil cap daily      Routine general medical examination at a health care facility    Labs including HCV and  HIV ordered, MGM ordered, given immunization today      Tobacco abuse    Encouraged complete cessation. Discussed need to quit as relates to risk of numerous cancers, cardiac and pulmonary disease as well as neurologic complications. Counseled for greater than 3 minutes       Other Visit Diagnoses    Anemia, unspecified anemia type    -  Primary    Relevant Orders    TSH (Completed)    CBC (Completed)    Lipid panel (Completed)    Comprehensive metabolic panel (Completed)    HIV antibody (with reflex) (Completed)    Hepatitis C Antibody (Completed)    Hypokalemia        Relevant Orders    TSH (Completed)    CBC (Completed)    Lipid panel (Completed)    Comprehensive metabolic panel (Completed)    HIV antibody (with reflex) (Completed)    Hepatitis C Antibody (Completed)    Preventative health care        Relevant Orders    TSH (Completed)    CBC (Completed)    Lipid panel (Completed)    Comprehensive metabolic panel (Completed)    HIV antibody (with reflex) (Completed)    Hepatitis C Antibody (Completed)    Screening for viral disease        Relevant Orders    TSH (Completed)    CBC (Completed)    Lipid panel (Completed)    Comprehensive metabolic panel (Completed)    HIV antibody (with reflex) (Completed)    Hepatitis C Antibody (Completed)    Breast cancer screening        Relevant Orders    MM Digital Screening (Completed)    Need for zoster vaccination        Relevant Orders    Varicella-zoster vaccine subcutaneous (Completed)       I have discontinued Ms. Pinder's sertraline, lisinopril, benzonatate, fluticasone, albuterol, levofloxacin, chlorpheniramine-HYDROcodone, and Fluticasone-Salmeterol. I am also having her start on sertraline. Additionally, I am having her maintain her lansoprazole.  Meds ordered this encounter  Medications  . sertraline (ZOLOFT) 100 MG tablet  Sig: Take 1 tablet (100 mg total) by mouth daily.    Dispense:  30 tablet     Refill:  5  . lansoprazole (PREVACID) 30 MG capsule    Sig: TAKE 1 CAPSULE (30 MG TOTAL) BY MOUTH DAILY.    Dispense:  90 capsule    Refill:  1    CYCLE FILL MEDICATION. Authorization is required for next refill.     Danise Edge, MD

## 2015-09-24 NOTE — Assessment & Plan Note (Signed)
Well controlled, no changes to meds. Encouraged heart healthy diet such as the DASH diet and exercise as tolerated.  °

## 2015-09-24 NOTE — Assessment & Plan Note (Signed)
Increase Sertraline to 100 mg daily.

## 2015-09-24 NOTE — Assessment & Plan Note (Signed)
Encouraged heart healthy diet, increase exercise, avoid trans fats, consider a krill oil cap daily 

## 2015-09-24 NOTE — Assessment & Plan Note (Signed)
Labs including HCV and HIV ordered, MGM ordered, given immunization today

## 2015-09-24 NOTE — Assessment & Plan Note (Signed)
Avoid offending foods, start probiotics. Do not eat large meals in late evening and consider raising head of bed. May continue Prevacid prn but encouraged to minimize if possible

## 2015-09-29 ENCOUNTER — Encounter (HOSPITAL_BASED_OUTPATIENT_CLINIC_OR_DEPARTMENT_OTHER): Payer: Self-pay | Admitting: Emergency Medicine

## 2015-09-29 ENCOUNTER — Emergency Department (HOSPITAL_BASED_OUTPATIENT_CLINIC_OR_DEPARTMENT_OTHER)
Admission: EM | Admit: 2015-09-29 | Discharge: 2015-09-30 | Disposition: A | Discharge status: Home / Self Care | Payer: BLUE CROSS/BLUE SHIELD | Attending: Emergency Medicine | Admitting: Emergency Medicine

## 2015-09-29 DIAGNOSIS — Z8619 Personal history of other infectious and parasitic diseases: Secondary | ICD-10-CM | POA: Diagnosis not present

## 2015-09-29 DIAGNOSIS — R0602 Shortness of breath: Secondary | ICD-10-CM | POA: Diagnosis present

## 2015-09-29 DIAGNOSIS — K219 Gastro-esophageal reflux disease without esophagitis: Secondary | ICD-10-CM | POA: Diagnosis not present

## 2015-09-29 DIAGNOSIS — F329 Major depressive disorder, single episode, unspecified: Secondary | ICD-10-CM | POA: Insufficient documentation

## 2015-09-29 DIAGNOSIS — Z8669 Personal history of other diseases of the nervous system and sense organs: Secondary | ICD-10-CM | POA: Insufficient documentation

## 2015-09-29 DIAGNOSIS — J209 Acute bronchitis, unspecified: Secondary | ICD-10-CM

## 2015-09-29 DIAGNOSIS — Z79899 Other long term (current) drug therapy: Secondary | ICD-10-CM | POA: Insufficient documentation

## 2015-09-29 DIAGNOSIS — I10 Essential (primary) hypertension: Secondary | ICD-10-CM | POA: Diagnosis not present

## 2015-09-29 DIAGNOSIS — J42 Unspecified chronic bronchitis: Secondary | ICD-10-CM | POA: Insufficient documentation

## 2015-09-29 DIAGNOSIS — F17210 Nicotine dependence, cigarettes, uncomplicated: Secondary | ICD-10-CM | POA: Insufficient documentation

## 2015-09-29 HISTORY — DX: Unspecified chronic bronchitis: J42

## 2015-09-29 MED ORDER — ALBUTEROL SULFATE (2.5 MG/3ML) 0.083% IN NEBU
INHALATION_SOLUTION | RESPIRATORY_TRACT | Status: AC
  Administered 2015-09-29: 5 mg
  Filled 2015-09-29: qty 6

## 2015-09-29 NOTE — ED Notes (Signed)
SOB with productive cough for one week. Worse with exertion and also can't lay flat.

## 2015-09-30 ENCOUNTER — Encounter (HOSPITAL_BASED_OUTPATIENT_CLINIC_OR_DEPARTMENT_OTHER): Payer: Self-pay | Admitting: Emergency Medicine

## 2015-09-30 ENCOUNTER — Emergency Department (HOSPITAL_BASED_OUTPATIENT_CLINIC_OR_DEPARTMENT_OTHER): Payer: BLUE CROSS/BLUE SHIELD

## 2015-09-30 MED ORDER — METHYLPREDNISOLONE 4 MG PO TBPK
ORAL_TABLET | ORAL | Status: DC
Start: 2015-09-30 — End: 2015-10-17

## 2015-09-30 MED ORDER — METHYLPREDNISOLONE SODIUM SUCC 125 MG IJ SOLR
125.0000 mg | Freq: Once | INTRAMUSCULAR | Status: AC
  Administered 2015-09-30: 125 mg via INTRAMUSCULAR
  Filled 2015-09-30: qty 2

## 2015-09-30 MED ORDER — ALBUTEROL SULFATE (2.5 MG/3ML) 0.083% IN NEBU
5.0000 mg | INHALATION_SOLUTION | Freq: Once | RESPIRATORY_TRACT | Status: AC
  Administered 2015-09-30: 5 mg via RESPIRATORY_TRACT
  Filled 2015-09-30: qty 6

## 2015-09-30 NOTE — Discharge Instructions (Signed)
Chronic Bronchitis Chronic bronchitis is a lasting inflammation of the bronchial tubes, which are the tubes that carry air into your lungs. This is inflammation that occurs:   On most days of the week.   For at least three months at a time.   Over a period of two years in a row. When the bronchial tubes are inflamed, they start to produce mucus. The inflammation and buildup of mucus make it more difficult to breathe. Chronic bronchitis is usually a permanent problem and is one type of chronic obstructive pulmonary disease (COPD). People with chronic bronchitis are at greater risk for getting repeated colds, or respiratory infections. CAUSES  Chronic bronchitis most often occurs in people who have:  Long-standing, severe asthma.  A history of smoking.  Asthma and who also smoke. SIGNS AND SYMPTOMS  Chronic bronchitis may cause the following:   A cough that brings up mucus (productive cough).  Shortness of breath.  Early morning headache.  Wheezing.  Chest discomfort.   Recurring respiratory infections. DIAGNOSIS  Your health care provider may confirm the diagnosis by:  Taking your medical history.  Performing a physical exam.  Taking a chest X-ray.   Performing pulmonary function tests. TREATMENT  Treatment involves controlling symptoms with medicines, oxygen therapy, or making lifestyle changes, such as exercising and eating a healthy, well-balanced diet. Medicines could include:  Inhalers to improve air flow in and out of your lungs.  Antibiotics to treat bacterial infections, such as pneumonia, sinus infections, and acute bronchitis. As a preventative measure, your health care provider may recommend routine vaccinations for influenza and pneumonia. This is to prevent infection and hospitalization since you may be more at risk for these types of infections.  HOME CARE INSTRUCTIONS  Take medicines only as directed by your health care provider.   If you smoke  cigarettes, chew tobacco, or use electronic cigarettes, quit. If you need help quitting, ask your health care provider.  Avoid pollen, dust, animal dander, molds, smoke, and other things that cause shortness of breath or wheezing attacks.  Talk to your health care provider about possible exercise routines. Regular exercise is very important to help you feel better.  If you are prescribed oxygen use at home follow these guidelines:  Never smoke while using oxygen. Oxygen does not burn or explode, but flammable materials will burn faster in the presence of oxygen.  Keep a fire extinguisher close by. Let your fire department know that you have oxygen in your home.  Warn visitors not to smoke near you when you are using oxygen. Put up "no smoking" signs in your home where you most often use the oxygen.  Regularly test your smoke detectors at home to make sure they work. If you receive care in your home from a nurse or other health care provider, he or she may also check to make sure your smoke detectors work.  Ask your health care provider whether you would benefit from a pulmonary rehabilitation program.  Do not wait to get medical care if you have any concerning symptoms. Delays could cause permanent injury and may be life threatening. SEEK MEDICAL CARE IF:  You have increased coughing or shortness of breath or both.  You have muscle aches.  You have chest pain.  Your mucus gets thicker.  Your mucus changes from clear or white to yellow, green, gray, or bloody. SEEK IMMEDIATE MEDICAL CARE IF:  Your usual medicines do not stop your wheezing.   You have increased difficulty breathing.     You have any problems with the medicine you are taking, such as a rash, itching, swelling, or trouble breathing. MAKE SURE YOU:   Understand these instructions.  Will watch your condition.  Will get help right away if you are not doing well or get worse.   This information is not intended to  replace advice given to you by your health care provider. Make sure you discuss any questions you have with your health care provider.   Document Released: 08/02/2006 Document Revised: 11/05/2014 Document Reviewed: 11/23/2013 Elsevier Interactive Patient Education 2016 [employer]  

## 2015-09-30 NOTE — ED Provider Notes (Signed)
CSN: [id number]     Arrival date & time 09/29/15  2341 History   First MD Initiated Contact with Patient 09/30/15 0053     Chief Complaint  Patient presents with  . Shortness of Breath     (Consider location/radiation/quality/duration/timing/severity/associated sxs/prior Treatment) HPI  This is a 59 year old female with a history of chronic bronchitis. She is here with about a 10 day history of shortness of breath worse than baseline. It acutely worsened yesterday evening and was severe enough that she could not walk from her bedroom to her bathroom without getting short of breath. She has a cough productive of yellow sputum. She denies fever, malaise or chest pain. She has been using her albuterol inhaler without adequate relief. She was given an albuterol neb treatment on arrival with significant improvement.  Past Medical History  Diagnosis Date  . Hypertension   . Insomnia   . Hiatal hernia   . GERD (gastroesophageal reflux disease)   . History of chicken pox   . H/O measles   . H/O mumps   . Hyperlipidemia, mixed 09/24/2015  . Depression 09/24/2015   Past Surgical History  Procedure Laterality Date  . Tonsillectomy     Family History  Problem Relation Age of Onset  . Hypertension Mother   . Allergies Mother   . Asthma Mother   . COPD Mother     previous smoker  . Heart disease Neg Hx   . Diabetes Sister   . Alzheimer's disease Maternal Grandfather    Social History  Substance Use Topics  . Smoking status: Current Some Day Smoker -- 1.00 packs/day for 45 years    Types: Cigarettes  . Smokeless tobacco: None  . Alcohol Use: No   OB History    No data available     Review of Systems  All other systems reviewed and are negative.   Allergies  Review of patient's allergies indicates no known allergies.  Home Medications   Prior to Admission medications   Medication Sig Start Date End Date Taking? Authorizing Provider  lansoprazole (PREVACID) 30 MG capsule  TAKE 1 CAPSULE (30 MG TOTAL) BY MOUTH DAILY. 09/15/15   Bradd CanaryStacey A Blyth, MD  sertraline (ZOLOFT) 100 MG tablet Take 1 tablet (100 mg total) by mouth daily. 09/15/15   Bradd CanaryStacey A Blyth, MD   BP 180/94 mmHg  Pulse 93  Temp(Src) 98 F (36.7 C) (Oral)  Resp 20  Ht 5\' 1"  (1.549 m)  Wt 146 lb (66.225 kg)  BMI 27.60 kg/m2  SpO2 97%   Physical Exam  General: Well-developed, well-nourished female in no acute distress; appearance consistent with age of record HENT: normocephalic; atraumatic Eyes: pupils equal, round and reactive to light; extraocular muscles intact Neck: supple Heart: regular rate and rhythm Lungs: Faint expiratory wheezes Abdomen: soft; nondistended; nontender; no masses or hepatosplenomegaly; bowel sounds present Extremities: No deformity; full range of motion; pulses normal Neurologic: Awake, alert and oriented; motor function intact in all extremities and symmetric; no facial droop Skin: Warm and dry Psychiatric: Normal mood and affect    ED Course  Procedures (including critical care time)   MDM  Nursing notes and vitals signs, including pulse oximetry, reviewed.  Summary of this visit's results, reviewed by myself:  Imaging Studies: Dg Chest 2 View  09/30/2015  CLINICAL DATA:  59 year old female with shortness of breath and productive cough EXAM: CHEST  2 VIEW COMPARISON:  Radiograph dated 07/24/2015 FINDINGS: Two views of the chest demonstrate emphysematous changes of the  lungs. There is no focal consolidation or pleural effusion or pneumothorax. The cardiac silhouette is within normal limits. There is degenerative changes of the spine. IMPRESSION: No active cardiopulmonary disease. Electronically Signed   By: Elgie Collard M.D.   On: 09/30/2015 00:46   1:33 AM Lungs clear after second neb treatment. Patient feels much better, ready to go home. She will follow up with her primary care physician at [facility].   Paula Libra, MD 09/30/15 [phone]

## 2015-09-30 NOTE — ED Notes (Signed)
C/o sob x 1 week  Worse w exertion

## 2015-10-17 ENCOUNTER — Emergency Department (HOSPITAL_BASED_OUTPATIENT_CLINIC_OR_DEPARTMENT_OTHER)
Admission: EM | Admit: 2015-10-17 | Discharge: 2015-10-17 | Disposition: A | Discharge status: Home / Self Care | Payer: BLUE CROSS/BLUE SHIELD | Attending: Emergency Medicine | Admitting: Emergency Medicine

## 2015-10-17 ENCOUNTER — Encounter (HOSPITAL_BASED_OUTPATIENT_CLINIC_OR_DEPARTMENT_OTHER): Payer: Self-pay | Admitting: *Deleted

## 2015-10-17 ENCOUNTER — Other Ambulatory Visit: Payer: Self-pay

## 2015-10-17 DIAGNOSIS — Z8669 Personal history of other diseases of the nervous system and sense organs: Secondary | ICD-10-CM | POA: Insufficient documentation

## 2015-10-17 DIAGNOSIS — J441 Chronic obstructive pulmonary disease with (acute) exacerbation: Secondary | ICD-10-CM | POA: Diagnosis not present

## 2015-10-17 DIAGNOSIS — F17210 Nicotine dependence, cigarettes, uncomplicated: Secondary | ICD-10-CM | POA: Insufficient documentation

## 2015-10-17 DIAGNOSIS — J449 Chronic obstructive pulmonary disease, unspecified: Secondary | ICD-10-CM

## 2015-10-17 DIAGNOSIS — K219 Gastro-esophageal reflux disease without esophagitis: Secondary | ICD-10-CM | POA: Insufficient documentation

## 2015-10-17 DIAGNOSIS — Z8619 Personal history of other infectious and parasitic diseases: Secondary | ICD-10-CM | POA: Diagnosis not present

## 2015-10-17 DIAGNOSIS — I10 Essential (primary) hypertension: Secondary | ICD-10-CM | POA: Diagnosis not present

## 2015-10-17 DIAGNOSIS — Z8639 Personal history of other endocrine, nutritional and metabolic disease: Secondary | ICD-10-CM | POA: Diagnosis not present

## 2015-10-17 DIAGNOSIS — R0602 Shortness of breath: Secondary | ICD-10-CM | POA: Diagnosis present

## 2015-10-17 DIAGNOSIS — Z79899 Other long term (current) drug therapy: Secondary | ICD-10-CM | POA: Insufficient documentation

## 2015-10-17 DIAGNOSIS — J209 Acute bronchitis, unspecified: Secondary | ICD-10-CM

## 2015-10-17 MED ORDER — ALBUTEROL SULFATE (2.5 MG/3ML) 0.083% IN NEBU
5.0000 mg | INHALATION_SOLUTION | Freq: Once | RESPIRATORY_TRACT | Status: AC
  Administered 2015-10-17: 5 mg via RESPIRATORY_TRACT
  Filled 2015-10-17: qty 6

## 2015-10-17 MED ORDER — LEVOFLOXACIN 750 MG PO TABS: 750.0000 mg | ORAL_TABLET | Freq: Every day | ORAL | Status: DC

## 2015-10-17 MED ORDER — PREDNISONE 10 MG PO TABS: 20.0000 mg | ORAL_TABLET | Freq: Two times a day (BID) | ORAL | Status: DC

## 2015-10-17 NOTE — ED Notes (Signed)
MD at bedside. 

## 2015-10-17 NOTE — ED Notes (Signed)
Pt states that she feels much better since the aerosol treatment. Decreased doe noted.

## 2015-10-17 NOTE — ED Notes (Signed)
Rt at bedside for eval

## 2015-10-17 NOTE — ED Provider Notes (Signed)
CSN: [id number]     Arrival date & time 10/17/15  0803 History   First MD Initiated Contact with Patient 10/17/15 0831     Chief Complaint  Patient presents with  . Cough  . Shortness of Breath     (Consider location/radiation/quality/duration/timing/severity/associated sxs/prior Treatment) HPI Comments: Patient is a 59 year old female with history of chronic bronchitis. She continues to smoke in excess of one pack per day. She was seen here 2 weeks ago for difficulty breathing, wheezing and was diagnosed with bronchitis. She was treated with prednisone, an inhaler. She initially improved, however her symptoms have returned. She reports having similar symptoms last year that required Levaquin.  Patient is a 59 y.o. female presenting with cough and shortness of breath. The history is provided by the patient.  Cough Cough characteristics:  Productive Sputum characteristics:  Yellow Severity:  Moderate Onset quality:  Gradual Duration:  3 weeks Timing:  Constant Progression:  Worsening Chronicity:  Recurrent Smoker: yes   Relieved by:  Nothing Worsened by:  Activity Ineffective treatments:  Cough suppressants, decongestant and beta-agonist inhaler Associated symptoms: shortness of breath   Shortness of Breath Associated symptoms: cough     Past Medical History  Diagnosis Date  . Hypertension   . Insomnia   . Hiatal hernia   . GERD (gastroesophageal reflux disease)   . History of chicken pox   . H/O measles   . H/O mumps   . Hyperlipidemia, mixed 09/24/2015  . Depression 09/24/2015  . Chronic bronchitis [facility])    Past Surgical History  Procedure Laterality Date  . Tonsillectomy     Family History  Problem Relation Age of Onset  . Hypertension Mother   . Allergies Mother   . Asthma Mother   . COPD Mother     previous smoker  . Heart disease Neg Hx   . Diabetes Sister   . Alzheimer's disease Maternal Grandfather    Social History  Substance Use Topics  .  Smoking status: Current Some Day Smoker -- 1.00 packs/day for 45 years    Types: Cigarettes  . Smokeless tobacco: None  . Alcohol Use: No   OB History    No data available     Review of Systems  Respiratory: Positive for cough and shortness of breath.   All other systems reviewed and are negative.     Allergies  Review of patient's allergies indicates no known allergies.  Home Medications   Prior to Admission medications   Medication Sig Start Date End Date Taking? Authorizing Provider  lansoprazole (PREVACID) 30 MG capsule TAKE 1 CAPSULE (30 MG TOTAL) BY MOUTH DAILY. 09/15/15   Bradd Canary, MD  sertraline (ZOLOFT) 100 MG tablet Take 1 tablet (100 mg total) by mouth daily. 09/15/15   Bradd Canary, MD   BP 166/86 mmHg  Pulse 94  Temp(Src) 98.2 F (36.8 C) (Oral)  Resp 18  Ht  (1.549 m)  Wt 140 lb (63.504 kg)  BMI 26.47 kg/m2  SpO2 100% Physical Exam  Constitutional: She is oriented to person, place, and time. She appears well-developed and well-nourished. No distress.  HENT:  Head: Normocephalic and atraumatic.  Mouth/Throat: Oropharynx is clear and moist.  Neck: Normal range of motion. Neck supple.  Cardiovascular: Normal rate and regular rhythm.  Exam reveals no gallop and no friction rub.   No murmur heard. Pulmonary/Chest: Effort normal. No respiratory distress. She has wheezes. She has no rales.  There are bilateral rhonchi present.  Abdominal:  Soft. Bowel sounds are normal. She exhibits no distension. There is no tenderness.  Musculoskeletal: Normal range of motion.  Lymphadenopathy:    She has no cervical adenopathy.  Neurological: She is alert and oriented to person, place, and time.  Skin: Skin is warm and dry. She is not diaphoretic.  Nursing note and vitals reviewed.   ED Course  Procedures (including critical care time) Labs Review Labs Reviewed - No data to display  Imaging Review No results found. I have personally reviewed and  evaluated these images and lab results as part of my medical decision-making.   EKG Interpretation   Date/Time:  Monday October 17 2015 08:21:11 EST Ventricular Rate:  81 PR Interval:  138 QRS Duration: 82 QT Interval:  368 QTC Calculation: 427 R Axis:   75 Text Interpretation:  Normal sinus rhythm Normal ECG Confirmed by Cooper Moroney   MD, Socorro Kanitz ([id number]) on 10/17/2015 8:33:42 AM      MDM   Final diagnoses:  None    This appears respiratory in nature. I suspect the patient has some underlying COPD. As her symptoms have persisted for several weeks and are not improving, I feel as though it is time to prescribing antibiotics. I see no indication for a chest x-ray as I believe this will not change my management. She is feeling better after a breathing treatment here in the ER and will be discharged with prednisone, Levaquin, and when necessary return.    Geoffery Lyonsouglas Harrel Ferrone, MD 10/17/15 [phone]

## 2015-10-17 NOTE — ED Notes (Signed)
Pt amb to room 1 with quick steady gait in nad. Pt reports seen here 2 weeks ago and dx with bronchitis, cont with cough and chest congestion. Pt states she is a daily smoker. Denies any fevers.

## 2015-10-17 NOTE — Discharge Instructions (Signed)
Levaquin and prednisone as prescribed.  Continue your albuterol inhaler 2 puffs every 4 hours as needed for wheezing.  Return to the ER if symptoms significantly worsen or change.   Acute Bronchitis Bronchitis is inflammation of the airways that extend from the windpipe into the lungs (bronchi). The inflammation often causes mucus to develop. This leads to a cough, which is the most common symptom of bronchitis.  In acute bronchitis, the condition usually develops suddenly and goes away over time, usually in a couple weeks. Smoking, allergies, and asthma can make bronchitis worse. Repeated episodes of bronchitis may cause further lung problems.  CAUSES Acute bronchitis is most often caused by the same virus that causes a cold. The virus can spread from person to person (contagious) through coughing, sneezing, and touching contaminated objects. SIGNS AND SYMPTOMS   Cough.   Fever.   Coughing up mucus.   Body aches.   Chest congestion.   Chills.   Shortness of breath.   Sore throat.  DIAGNOSIS  Acute bronchitis is usually diagnosed through a physical exam. Your health care provider will also ask you questions about your medical history. Tests, such as chest X-rays, are sometimes done to rule out other conditions.  TREATMENT  Acute bronchitis usually goes away in a couple weeks. Oftentimes, no medical treatment is necessary. Medicines are sometimes given for relief of fever or cough. Antibiotic medicines are usually not needed but may be prescribed in certain situations. In some cases, an inhaler may be recommended to help reduce shortness of breath and control the cough. A cool mist vaporizer may also be used to help thin bronchial secretions and make it easier to clear the chest.  HOME CARE INSTRUCTIONS  Get plenty of rest.   Drink enough fluids to keep your urine clear or pale yellow (unless you have a medical condition that requires fluid restriction). Increasing fluids  may help thin your respiratory secretions (sputum) and reduce chest congestion, and it will prevent dehydration.   Take medicines only as directed by your health care provider.  If you were prescribed an antibiotic medicine, finish it all even if you start to feel better.  Avoid smoking and secondhand smoke. Exposure to cigarette smoke or irritating chemicals will make bronchitis worse. If you are a smoker, consider using nicotine gum or skin patches to help control withdrawal symptoms. Quitting smoking will help your lungs heal faster.   Reduce the chances of another bout of acute bronchitis by washing your hands frequently, avoiding people with cold symptoms, and trying not to touch your hands to your mouth, nose, or eyes.   Keep all follow-up visits as directed by your health care provider.  SEEK MEDICAL CARE IF: Your symptoms do not improve after 1 week of treatment.  SEEK IMMEDIATE MEDICAL CARE IF:  You develop an increased fever or chills.   You have chest pain.   You have severe shortness of breath.  You have bloody sputum.   You develop dehydration.  You faint or repeatedly feel like you are going to pass out.  You develop repeated vomiting.  You develop a severe headache. MAKE SURE YOU:   Understand these instructions.  Will watch your condition.  Will get help right away if you are not doing well or get worse.   This information is not intended to replace advice given to you by your health care provider. Make sure you discuss any questions you have with your health care provider.   Document Released: 11/22/2004  Document Revised: 11/05/2014 Document Reviewed: 04/07/2013 Elsevier Interactive Patient Education 2016 Else[employer] Inc[employer]  Chronic Obstructive Pulmonary Disease Exacerbation Chronic obstructive pulmonary disease (COPD) is a common lung condition in which airflow from the lungs is limited. COPD is a general term that can be used to describe many  different lung problems that limit airflow, including chronic bronchitis and emphysema. COPD exacerbations are episodes when breathing symptoms become much worse and require extra treatment. Without treatment, COPD exacerbations can be life threatening, and frequent COPD exacerbations can cause further damage to your lungs. CAUSES  Respiratory infections.  Exposure to smoke.  Exposure to air pollution, chemical fumes, or dust. Sometimes there is no apparent cause or trigger. RISK FACTORS  Smoking cigarettes.  Older age.  Frequent prior COPD exacerbations. SIGNS AND SYMPTOMS  Increased coughing.  Increased thick spit (sputum) production.  Increased wheezing.  Increased shortness of breath.  Rapid breathing.  Chest tightness. DIAGNOSIS Your medical history, a physical exam, and tests will help your health care provider make a diagnosis. Tests may include:  A chest X-ray.  Basic lab tests.  Sputum testing.  An arterial blood gas test. TREATMENT Depending on the severity of your COPD exacerbation, you may need to be admitted to a hospital for treatment. Some of the treatments commonly used to treat COPD exacerbations are:   Antibiotic medicines.  Bronchodilators. These are drugs that expand the air passages. They may be given with an inhaler or nebulizer. Spacer devices may be needed to help improve drug delivery.  Corticosteroid medicines.  Supplemental oxygen therapy.  Airway clearing techniques, such as noninvasive ventilation (NIV) and positive expiratory pressure (PEP). These provide respiratory support through a mask or other noninvasive device. HOME CARE INSTRUCTIONS  Do not smoke. Quitting smoking is very important to prevent COPD from getting worse and exacerbations from happening as often.  Avoid exposure to all substances that irritate the airway, especially to tobacco smoke.  If you were prescribed an antibiotic medicine, finish it all even if you  start to feel better.  Take all medicines as directed by your health care provider.It is important to use correct technique with inhaled medicines.  Drink enough fluids to keep your urine clear or pale yellow (unless you have a medical condition that requires fluid restriction).  Use a cool mist vaporizer. This makes it easier to clear your chest when you cough.  If you have a home nebulizer and oxygen, continue to use them as directed.  Maintain all necessary vaccinations to prevent infections.  Exercise regularly.  Eat a healthy diet.  Keep all follow-up appointments as directed by your health care provider. SEEK IMMEDIATE MEDICAL CARE IF:  You have worsening shortness of breath.  You have trouble talking.  You have severe chest pain.  You have blood in your sputum.  You have a fever.  You have weakness, vomit repeatedly, or faint.  You feel confused.  You continue to get worse. MAKE SURE YOU:  Understand these instructions.  Will watch your condition.  Will get help right away if you are not doing well or get worse.   This information is not intended to replace advice given to you by your health care provider. Make sure you discuss any questions you have with your health care provider.   Document Released: 08/12/2007 Document Revised: 11/05/2014 Document Reviewed: 06/19/2013 Elsevier Interactive Patient Education [employer]

## 2015-11-04 ENCOUNTER — Telehealth: Payer: Self-pay | Admitting: Family Medicine

## 2015-11-04 MED ORDER — ALBUTEROL SULFATE HFA 108 (90 BASE) MCG/ACT IN AERS: 2.0000 | INHALATION_SPRAY | Freq: Four times a day (QID) | RESPIRATORY_TRACT | Status: DC | PRN

## 2015-11-04 NOTE — Telephone Encounter (Signed)
Caller name: Kendal HymenBonnie   Relationship to patient: Self   Can be reached: [phone]  Pharmacy: [facility] - [place], [place] - [address]  Reason for call: Pt is requesting a refill on her albuterol inhaler

## 2015-11-04 NOTE — Telephone Encounter (Signed)
Refill done.  

## 2015-11-16 ENCOUNTER — Ambulatory Visit (INDEPENDENT_AMBULATORY_CARE_PROVIDER_SITE_OTHER): Payer: 59 | Admitting: Medical

## 2015-11-16 ENCOUNTER — Ambulatory Visit (HOSPITAL_BASED_OUTPATIENT_CLINIC_OR_DEPARTMENT_OTHER)
Admission: RE | Admit: 2015-11-16 | Discharge: 2015-11-16 | Disposition: A | Discharge status: Home / Self Care | Payer: 59 | Source: Ambulatory Visit | Attending: Medical | Admitting: Medical

## 2015-11-16 ENCOUNTER — Encounter: Payer: Self-pay | Admitting: Medical

## 2015-11-16 VITALS — BP 138/80 | HR 88 | Temp 98.1°F | Ht 61.0 in | Wt 141.6 lb

## 2015-11-16 DIAGNOSIS — R05 Cough: Secondary | ICD-10-CM

## 2015-11-16 DIAGNOSIS — J449 Chronic obstructive pulmonary disease, unspecified: Secondary | ICD-10-CM | POA: Insufficient documentation

## 2015-11-16 DIAGNOSIS — R059 Cough, unspecified: Secondary | ICD-10-CM

## 2015-11-16 DIAGNOSIS — J209 Acute bronchitis, unspecified: Secondary | ICD-10-CM | POA: Diagnosis not present

## 2015-11-16 DIAGNOSIS — R062 Wheezing: Secondary | ICD-10-CM | POA: Diagnosis not present

## 2015-11-16 MED ORDER — PREDNISONE 10 MG PO TABS: ORAL_TABLET | ORAL | Status: DC

## 2015-11-16 MED ORDER — IPRATROPIUM-ALBUTEROL 0.5-2.5 (3) MG/3ML IN SOLN
3.0000 mL | Freq: Once | RESPIRATORY_TRACT | Status: AC
  Administered 2015-11-16: 3 mL via RESPIRATORY_TRACT

## 2015-11-16 MED ORDER — HYDROCODONE-HOMATROPINE 5-1.5 MG/5ML PO SYRP: 5.0000 mL | ORAL_SOLUTION | Freq: Three times a day (TID) | ORAL | Status: DC | PRN

## 2015-11-16 MED ORDER — METHYLPREDNISOLONE ACETATE 40 MG/ML IJ SUSP
40.0000 mg | Freq: Once | INTRAMUSCULAR | Status: AC
  Administered 2015-11-16: 40 mg via INTRAMUSCULAR

## 2015-11-16 MED ORDER — ALBUTEROL SULFATE HFA 108 (90 BASE) MCG/ACT IN AERS: 2.0000 | INHALATION_SPRAY | Freq: Four times a day (QID) | RESPIRATORY_TRACT | Status: DC | PRN

## 2015-11-16 MED ORDER — FLUTICASONE-SALMETEROL 100-50 MCG/DOSE IN AEPB: 1.0000 | INHALATION_SPRAY | Freq: Two times a day (BID) | RESPIRATORY_TRACT | Status: DC

## 2015-11-16 MED ORDER — DOXYCYCLINE HYCLATE 100 MG PO TABS: 100.0000 mg | ORAL_TABLET | Freq: Two times a day (BID) | ORAL | Status: DC

## 2015-11-16 MED FILL — predniSONE 10 MG TABS: 10 | 7 days supply | Qty: 28 | Fill #0

## 2015-11-16 MED FILL — DOXYCYCLINE 100 MG TABLET: 100 | 10 days supply | Qty: 20 | Fill #0

## 2015-11-16 MED FILL — ADVAIR 100/50 DISKUS: 100-50 | 30 days supply | Qty: 60 | Fill #0

## 2015-11-16 MED FILL — HYDROCODONE-HOMATROPINE SYR: 5-1.5 | 8 days supply | Qty: 120 | Fill #0

## 2015-11-16 NOTE — Progress Notes (Signed)
Subjective:    Patient ID: Morgan Harper, female    DOB: [birth year], 60 y.o.   MRN: [id number]  HPI   Pt in for evaluation. Pt has been wheezing for 2 days. Moderate to severe. She states around christmas had some wheezing symptoms. Pt went to ED and got treatment(appears that she got levofloxin and prednisone) . She felt better but no completley better. Then over last 3 days symptoms flared again.   Pt went to UC but left because everybody was coughing and pt was concerned catch something else.  Some mild occasional mucous when she coughs last 2 days.  Pt is a smoker. She not diabetic.   Review of Systems  Constitutional: Negative for chills and fatigue.  HENT: Negative for congestion.   Respiratory: Positive for shortness of breath and wheezing. Negative for cough and chest tightness.   Cardiovascular: Negative for chest pain and palpitations.  Gastrointestinal: Negative for abdominal pain.  Musculoskeletal: Negative for back pain.  Hematological: Negative for adenopathy. Does not bruise/bleed easily.  Psychiatric/Behavioral: Negative for behavioral problems and confusion.    Past Medical History  Diagnosis Date  . Hypertension   . Insomnia   . Hiatal hernia   . GERD (gastroesophageal reflux disease)   . History of chicken pox   . H/O measles   . H/O mumps   . Hyperlipidemia, mixed 09/24/2015  . Depression 09/24/2015  . Chronic bronchitis (HCC)     Social History   Social History  . Marital Status: Married    Spouse Name: N/A  . Number of Children: N/A  . Years of Education: N/A   Occupational History  . Not on file.   Social History Main Topics  . Smoking status: Current Some Day Smoker -- 1.00 packs/day for 45 years    Types: Cigarettes  . Smokeless tobacco: Not on file  . Alcohol Use: No  . Drug Use: No  . Sexual Activity: Not on file     Comment: work managing a hotel, lives with husband, niece and grandnephew, no dietary   Other Topics Concern    . Not on file   Social History Narrative    Past Surgical History  Procedure Laterality Date  . Tonsillectomy      Family History  Problem Relation Age of Onset  . Hypertension Mother   . Allergies Mother   . Asthma Mother   . COPD Mother     previous smoker  . Heart disease Neg Hx   . Diabetes Sister   . Alzheimer's disease Maternal Grandfather     No Known Allergies  Current Outpatient Prescriptions on File Prior to Visit  Medication Sig Dispense Refill  . albuterol (PROVENTIL HFA;VENTOLIN HFA) 108 (90 Base) MCG/ACT inhaler Inhale 2 puffs into the lungs every 6 (six) hours as needed for wheezing. 1 Inhaler 0  . lansoprazole (PREVACID) 30 MG capsule TAKE 1 CAPSULE (30 MG TOTAL) BY MOUTH DAILY. 90 capsule 1  . sertraline (ZOLOFT) 100 MG tablet Take 1 tablet (100 mg total) by mouth daily. 30 tablet 5   No current facility-administered medications on file prior to visit.    BP 138/80 mmHg  Pulse 88  Temp(Src) 98.1 F (36.7 C) (Oral)  Ht  (1.549 m)  Wt 141 lb 9.6 oz (64.229 kg)  BMI 26.77 kg/m2  SpO2 95%       Objective:   Physical Exam  General  Mental Status - Alert. General Appearance - Well groomed. Not in acute  distress.  Skin Rashes- No Rashes.  HEENT Head- Normal. Ear Auditory Canal - Left- Normal. Right - Normal.Tympanic Membrane- Left- Normal. Right- Normal. Eye Sclera/Conjunctiva- Left- Normal. Right- Normal. Nose & Sinuses Nasal Mucosa- Left-  Not oggy or Congested. Right-  Not  boggy or Congested. Mouth & Throat Lips: Upper Lip- Normal: no dryness, cracking, pallor, cyanosis, or vesicular eruption. Lower Lip-Normal: no dryness, cracking, pallor, cyanosis or vesicular eruption. Buccal Mucosa- Bilateral- No Aphthous ulcers. Oropharynx- No Discharge or Erythema. Tonsils: Characteristics- Bilateral- No Erythema or Congestion. Size/Enlargement- Bilateral- No enlargement. Discharge- bilateral-None.  Neck Neck- Supple. No Masses.   Chest  and Lung Exam Auscultation: Breath Sounds:- even and unlabored, but bilateral upper lobe rhonchi and expiratory wheeze. Can hear some wheezing about 4 feet across room.  Mid way through neb treatment obvious wheeze from 4 ft cleared. Auscultation deeper and clearer. Less wheezing.  Cardiovascular Auscultation:Rythm- Regular, rate and rhythm. Murmurs & Other Heart Sounds:Ausculatation of the heart reveal- No Murmurs.  Lymphatic Head & Neck General Head & Neck Lymphatics: Bilateral: Description- No Localized lymphadenopathy.       Assessment & Plan:  We gave a breathing treatment(duoneb/2 medications) in office today.  We gave depo medrol injection to decrease inflammation in your lungs.  Prescription of prednisone taper dose today to use over next 6 days.  Prescription of advair sent to your pharmacy. Use you albuterol inhaler as well if needed.  Hycodan cough syrup.  Doxycycline antibiotic. Please get cxr today.  Follow up in 7 days or as needed  Pt advised with above measure and if worsening then ED evaluation.

## 2015-11-16 NOTE — Patient Instructions (Signed)
We gave a breathing treatment(duoneb/2 medications) in office today.  We gave depo medrol injection to decrease inflammation in your lungs.  Prescription of prednisone taper dose today to use over next 6 days.  Prescription of advair sent to your pharmacy. Use you albuterol inhaler as well if needed.  Hycodan cough syrup.  Doxycycline antibiotic. Please get cxr today.  Follow up in 7 days or as needed

## 2015-11-16 NOTE — Progress Notes (Signed)
Pre visit review using our clinic review tool, if applicable. No additional management support is needed unless otherwise documented below in the visit note. 

## 2015-12-16 ENCOUNTER — Ambulatory Visit: Payer: 59 | Admitting: Family Medicine

## 2016-01-09 ENCOUNTER — Other Ambulatory Visit: Payer: Self-pay | Admitting: Family Medicine

## 2016-01-09 ENCOUNTER — Telehealth: Payer: Self-pay | Admitting: Family Medicine

## 2016-01-09 MED ORDER — ALBUTEROL SULFATE HFA 108 (90 BASE) MCG/ACT IN AERS: 2.0000 | INHALATION_SPRAY | Freq: Four times a day (QID) | RESPIRATORY_TRACT | Status: DC | PRN

## 2016-01-09 MED FILL — VENTOLIN HFA 90 MCG INHALER: 108 (90 BAS | 30 days supply | Qty: 18 | Fill #0

## 2016-01-09 NOTE — Telephone Encounter (Signed)
Caller name: Self Relationship to patient: Can be reached: [phone]  Pharmacy:  [facility] - [place], [place] - [address] [phone] (Phone) [phone] (Fax)       Reason for call: Refill albuterol (PROVENTIL HFA;VENTOLIN HFA) 108 (90 Base) MCG/ACT inhaler [[id number]][[id number]]

## 2016-01-13 ENCOUNTER — Ambulatory Visit (INDEPENDENT_AMBULATORY_CARE_PROVIDER_SITE_OTHER): Payer: 59 | Admitting: Family Medicine

## 2016-01-13 ENCOUNTER — Encounter: Payer: Self-pay | Admitting: Family Medicine

## 2016-01-13 VITALS — BP 166/84 | HR 88 | Temp 98.2°F | Ht 61.0 in | Wt 142.0 lb

## 2016-01-13 DIAGNOSIS — F329 Major depressive disorder, single episode, unspecified: Secondary | ICD-10-CM

## 2016-01-13 DIAGNOSIS — E782 Mixed hyperlipidemia: Secondary | ICD-10-CM | POA: Diagnosis not present

## 2016-01-13 DIAGNOSIS — I10 Essential (primary) hypertension: Secondary | ICD-10-CM | POA: Diagnosis not present

## 2016-01-13 DIAGNOSIS — J441 Chronic obstructive pulmonary disease with (acute) exacerbation: Secondary | ICD-10-CM | POA: Diagnosis not present

## 2016-01-13 DIAGNOSIS — Z720 Tobacco use: Secondary | ICD-10-CM

## 2016-01-13 DIAGNOSIS — F32A Depression, unspecified: Secondary | ICD-10-CM

## 2016-01-13 DIAGNOSIS — K219 Gastro-esophageal reflux disease without esophagitis: Secondary | ICD-10-CM

## 2016-01-13 DIAGNOSIS — D649 Anemia, unspecified: Secondary | ICD-10-CM | POA: Insufficient documentation

## 2016-01-13 HISTORY — DX: Anemia, unspecified: D64.9

## 2016-01-13 LAB — CBC
HCT: 36.3 % (ref 36.0–46.0)
HEMOGLOBIN: 11.8 g/dL — AB (ref 12.0–15.0)
MCHC: 32.5 g/dL (ref 30.0–36.0)
MCV: 89.9 fl (ref 78.0–100.0)
PLATELETS: 307 10*3/uL (ref 150.0–400.0)
RBC: 4.03 Mil/uL (ref 3.87–5.11)
RDW: 16.3 % — ABNORMAL HIGH (ref 11.5–15.5)
WBC: 6.4 10*3/uL (ref 4.0–10.5)

## 2016-01-13 LAB — COMPREHENSIVE METABOLIC PANEL
ALBUMIN: 4.2 g/dL (ref 3.5–5.2)
ALT: 11 U/L (ref 0–35)
AST: 10 U/L (ref 0–37)
Alkaline Phosphatase: 68 U/L (ref 39–117)
BILIRUBIN TOTAL: 0.3 mg/dL (ref 0.2–1.2)
BUN: 17 mg/dL (ref 6–23)
CALCIUM: 9.5 mg/dL (ref 8.4–10.5)
CHLORIDE: 107 meq/L (ref 96–112)
CO2: 26 mEq/L (ref 19–32)
CREATININE: 0.75 mg/dL (ref 0.40–1.20)
GFR: 83.45 mL/min (ref >60.00)
Glucose, Bld: 81 mg/dL (ref 70–99)
Potassium: 4 mEq/L (ref 3.5–5.1)
Sodium: 141 mEq/L (ref 135–145)
Total Protein: 6.9 g/dL (ref 6.0–8.3)

## 2016-01-13 LAB — LIPID PANEL
CHOLESTEROL: 215 mg/dL — AB (ref 0–200)
HDL: 62.2 mg/dL (ref >39.00)
LDL CALC: 136 mg/dL — AB (ref 0–99)
NonHDL: 153.08
TRIGLYCERIDES: 87 mg/dL (ref 0.0–149.0)
Total CHOL/HDL Ratio: 3
VLDL: 17.4 mg/dL (ref 0.0–40.0)

## 2016-01-13 LAB — TSH: TSH: 1.09 u[IU]/mL (ref 0.35–4.50)

## 2016-01-13 MED ORDER — FLUTICASONE-SALMETEROL 250-50 MCG/DOSE IN AEPB: 1.0000 | INHALATION_SPRAY | Freq: Two times a day (BID) | RESPIRATORY_TRACT | Status: DC

## 2016-01-13 MED ORDER — SERTRALINE HCL 25 MG PO TABS: 25.0000 mg | ORAL_TABLET | Freq: Every day | ORAL | Status: DC

## 2016-01-13 MED ORDER — PREDNISONE 10 MG PO TABS: ORAL_TABLET | ORAL | Status: DC

## 2016-01-13 MED ORDER — LANSOPRAZOLE 30 MG PO CPDR: 30.0000 mg | DELAYED_RELEASE_CAPSULE | Freq: Every day | ORAL | Status: DC

## 2016-01-13 MED ORDER — VARENICLINE TARTRATE 1 MG PO TABS: 1.0000 mg | ORAL_TABLET | Freq: Two times a day (BID) | ORAL | Status: DC

## 2016-01-13 MED ORDER — AMLODIPINE BESYLATE 2.5 MG PO TABS: 2.5000 mg | ORAL_TABLET | Freq: Every day | ORAL | Status: DC

## 2016-01-13 MED ORDER — LEVOFLOXACIN 500 MG PO TABS: 500.0000 mg | ORAL_TABLET | Freq: Every day | ORAL | Status: DC

## 2016-01-13 MED ORDER — VARENICLINE TARTRATE 0.5 MG X 11 & 1 MG X 42 PO MISC: ORAL | Status: DC

## 2016-01-13 NOTE — Assessment & Plan Note (Signed)
Will do a course of Levaquin and Prednisone, increase Advair to 250 daily and refer to pulmonology

## 2016-01-13 NOTE — Assessment & Plan Note (Signed)
Encouraged heart healthy diet, increase exercise, avoid trans fats, consider a krill oil cap daily 

## 2016-01-13 NOTE — Progress Notes (Signed)
Pre visit review using our clinic review tool, if applicable. No additional management support is needed unless otherwise documented below in the visit note. 

## 2016-01-13 NOTE — Assessment & Plan Note (Signed)
Sertraline has been helping some still struggles to some degree. Will add a 25 mg dose in the afternoon and patient will let us know if that is helpful. Continue the 100 mg in am

## 2016-01-13 NOTE — Assessment & Plan Note (Addendum)
Encouraged complete cessation. Discussed need to quit as relates to risk of numerous cancers, cardiac and pulmonary disease as well as neurologic complications. Counseled for greater than 3 minutes. Is at 1/2 ppd. Will try Chantix and may use gum prn

## 2016-01-13 NOTE — Assessment & Plan Note (Signed)
Resolved at last visit. Will nmonitor

## 2016-01-13 NOTE — Assessment & Plan Note (Signed)
Only Prevacid works well for patient but insurance is unwilling to pay for it, will try one more time, patient will let us know if they do not pay for med again

## 2016-01-13 NOTE — Assessment & Plan Note (Signed)
-   Start Amlodipine 2.5mg daily

## 2016-01-13 NOTE — Patient Instructions (Addendum)
Try Nicotine gum or patch to help with craving if needed.  Smoking Cessation, Tips for Success If you are ready to quit smoking, congratulations! You have chosen to help yourself be healthier. Cigarettes bring nicotine, tar, carbon monoxide, and other irritants into your body. Your lungs, heart, and blood vessels will be able to work better without these poisons. There are many different ways to quit smoking. Nicotine gum, nicotine patches, a nicotine inhaler, or nicotine nasal spray can help with physical craving. Hypnosis, support groups, and medicines help break the habit of smoking. WHAT THINGS CAN I DO TO MAKE QUITTING EASIER?  Here are some tips to help you quit for good:  Pick a date when you will quit smoking completely. Tell all of your friends and family about your plan to quit on that date.  Do not try to slowly cut down on the number of cigarettes you are smoking. Pick a quit date and quit smoking completely starting on that day.  Throw away all cigarettes.   Clean and remove all ashtrays from your home, work, and car.  On a card, write down your reasons for quitting. Carry the card with you and read it when you get the urge to smoke.  Cleanse your body of nicotine. Drink enough water and fluids to keep your urine clear or pale yellow. Do this after quitting to flush the nicotine from your body.  Learn to predict your moods. Do not let a bad situation be your excuse to have a cigarette. Some situations in your life might tempt you into wanting a cigarette.  Never have "just one" cigarette. It leads to wanting another and another. Remind yourself of your decision to quit.  Change habits associated with smoking. If you smoked while driving or when feeling stressed, try other activities to replace smoking. Stand up when drinking your coffee. Brush your teeth after eating. Sit in a different chair when you read the paper. Avoid alcohol while trying to quit, and try to drink fewer  caffeinated beverages. Alcohol and caffeine may urge you to smoke.  Avoid foods and drinks that can trigger a desire to smoke, such as sugary or spicy foods and alcohol.  Ask people who smoke not to smoke around you.  Have something planned to do right after eating or having a cup of coffee. For example, plan to take a walk or exercise.  Try a relaxation exercise to calm you down and decrease your stress. Remember, you may be tense and nervous for the first 2 weeks after you quit, but this will pass.  Find new activities to keep your hands busy. Play with a pen, coin, or rubber band. Doodle or draw things on paper.  Brush your teeth right after eating. This will help cut down on the craving for the taste of tobacco after meals. You can also try mouthwash.   Use oral substitutes in place of cigarettes. Try using lemon drops, carrots, cinnamon sticks, or chewing gum. Keep them handy so they are available when you have the urge to smoke.  When you have the urge to smoke, try deep breathing.  Designate your home as a nonsmoking area.  If you are a heavy smoker, ask your health care provider about a prescription for nicotine chewing gum. It can ease your withdrawal from nicotine.  Reward yourself. Set aside the cigarette money you save and buy yourself something nice.  Look for support from others. Join a support group or smoking cessation program. Ask  someone at home or at work to help you with your plan to quit smoking.  Always ask yourself, "Do I need this cigarette or is this just a reflex?" Tell yourself, "Today, I choose not to smoke," or "I do not want to smoke." You are reminding yourself of your decision to quit.  Do not replace cigarette smoking with electronic cigarettes (commonly called e-cigarettes). The safety of e-cigarettes is unknown, and some may contain harmful chemicals.  If you relapse, do not give up! Plan ahead and think about what you will do the next time you get  the urge to smoke. HOW WILL I FEEL WHEN I QUIT SMOKING? You may have symptoms of withdrawal because your body is used to nicotine (the addictive substance in cigarettes). You may crave cigarettes, be irritable, feel very hungry, cough often, get headaches, or have difficulty concentrating. The withdrawal symptoms are only temporary. They are strongest when you first quit but will go away within 10-14 days. When withdrawal symptoms occur, stay in control. Think about your reasons for quitting. Remind yourself that these are signs that your body is healing and getting used to being without cigarettes. Remember that withdrawal symptoms are easier to treat than the major diseases that smoking can cause.  Even after the withdrawal is over, expect periodic urges to smoke. However, these cravings are generally short lived and will go away whether you smoke or not. Do not smoke! WHAT RESOURCES ARE AVAILABLE TO HELP ME QUIT SMOKING? Your health care provider can direct you to community resources or hospitals for support, which may include:  Group support.  Education.  Hypnosis.  Therapy.   This information is not intended to replace advice given to you by your health care provider. Make sure you discuss any questions you have with your health care provider.   Document Released: 07/13/2004 Document Revised: 11/05/2014 Document Reviewed: 04/02/2013 Elsevier Interactive Patient Education [employer].

## 2016-01-13 NOTE — Progress Notes (Signed)
Subjective:    Patient ID: Morgan Harper, female    DOB: 10/25/1956, 60 y.o.   MRN: [id number]  Chief Complaint  Patient presents with  . Follow-up    3 month on bronchitis-pt states she got better but sxs has come back-cough(product-white),out of breath and wheezing    HPI Patient is in today for follow up. Patient has been having 3 months of bronchitis off and on, inhaler gives temporary relief, but symptoms still not resolved.  States that she wants to quit smoking, she struggles with breathing with climbing stairs, and walking.  Denies CP/palp/HA/congestion/fevers/GI or GU c/o. Taking meds as prescribed. Still struggles with some depression better with sertraline but still struggling with fatigue and anhedonia, no suicidal ideation.    Past Medical History  Diagnosis Date  . Hypertension   . Insomnia   . Hiatal hernia   . GERD (gastroesophageal reflux disease)   . History of chicken pox   . H/O measles   . H/O mumps   . Hyperlipidemia, mixed 09/24/2015  . Depression 09/24/2015  . Chronic bronchitis (HCC)   . Anemia 01/13/2016    Past Surgical History  Procedure Laterality Date  . Tonsillectomy      Family History  Problem Relation Age of Onset  . Hypertension Mother   . Allergies Mother   . Asthma Mother   . COPD Mother     previous smoker  . Heart disease Neg Hx   . Diabetes Sister   . Alzheimer's disease Maternal Grandfather     Social History   Social History  . Marital Status: Married    Spouse Name: N/A  . Number of Children: N/A  . Years of Education: N/A   Occupational History  . Not on file.   Social History Main Topics  . Smoking status: Current Some Day Smoker -- 1.00 packs/day for 45 years    Types: Cigarettes  . Smokeless tobacco: Not on file  . Alcohol Use: No  . Drug Use: No  . 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 Not on  file   Social History Narrative    Outpatient Prescriptions Prior to Visit  Medication Sig Dispense Refill  . albuterol (PROVENTIL HFA;VENTOLIN HFA) 108 (90 Base) MCG/ACT inhaler Inhale 2 puffs into the lungs every 6 (six) hours as needed for wheezing. 1 Inhaler 2  . sertraline (ZOLOFT) 100 MG tablet Take 1 tablet (100 mg total) by mouth daily. 30 tablet 5  . Fluticasone-Salmeterol (ADVAIR) 100-50 MCG/DOSE AEPB Inhale 1 puff into the lungs 2 (two) times daily. 1 each 3  . lansoprazole (PREVACID) 30 MG capsule TAKE 1 CAPSULE (30 MG TOTAL) BY MOUTH DAILY. 90 capsule 1  . doxycycline (VIBRA-TABS) 100 MG tablet Take 1 tablet (100 mg total) by mouth 2 (two) times daily. 20 tablet 0  . HYDROcodone-homatropine (HYCODAN) 5-1.5 MG/5ML syrup Take 5 mLs by mouth every 8 (eight) hours as needed for cough. 120 mL 0  . predniSONE (DELTASONE) 10 MG tablet 7 tab po day 1, 6 tab po day 2, 5 tab po day 3, 4 tab po day 4, 3 tab pod day 5, 2 tab po day 6, 1 tab po day 7. 28 tablet 0  . VENTOLIN HFA 108 (90 Base) MCG/ACT inhaler INHALE 2 PUFFS INTO THE LUNGS EVERY 6 HOURS AS NEEDED FOR WHEEZING 18 each 0  No facility-administered medications prior to visit.    No Known Allergies  Review of Systems  Constitutional: Positive for malaise/fatigue. Negative for fever.  HENT: Positive for congestion.   Eyes: Negative for blurred vision.  Respiratory: Positive for cough, sputum production, shortness of breath and wheezing.   Cardiovascular: Negative for chest pain, palpitations and leg swelling.  Gastrointestinal: Negative for nausea, abdominal pain and blood in stool.  Genitourinary: Negative for dysuria and frequency.  Musculoskeletal: Negative for falls.  Skin: Negative for rash.  Neurological: Negative for dizziness, loss of consciousness and headaches.  Endo/Heme/Allergies: Negative for environmental allergies.  Psychiatric/Behavioral: Positive for depression. The patient is not nervous/anxious.          Objective:    Physical Exam  Constitutional: She is oriented to person, place, and time. She appears well-developed and well-nourished. No distress.  HENT:  Head: Normocephalic and atraumatic.  Eyes: Conjunctivae are normal.  Neck: Neck supple. No thyromegaly present.  Cardiovascular: Normal rate, regular rhythm and normal heart sounds.   No murmur heard. Pulmonary/Chest: Effort normal. No respiratory distress. She has wheezes.  Abdominal: Soft. Bowel sounds are normal. She exhibits no distension and no mass. There is no tenderness.  Musculoskeletal: She exhibits no edema.  Lymphadenopathy:    She has no cervical adenopathy.  Neurological: She is alert and oriented to person, place, and time.  Skin: Skin is warm and dry.  Psychiatric: She has a normal mood and affect. Her behavior is normal.    BP 166/84 mmHg  Pulse 88  Temp(Src) 98.2 F (36.8 C) (Oral)  Ht 5\' 1"  (1.549 m)  Wt 142 lb (64.411 kg)  BMI 26.84 kg/m2  SpO2 98% Wt Readings from Last 3 Encounters:  01/13/16 142 lb (64.411 kg)  11/16/15 141 lb 9.6 oz (64.229 kg)  10/17/15 140 lb (63.504 kg)     Lab Results  Component Value Date   WBC 7.4 09/15/2015   HGB 12.4 09/15/2015   HCT 38.0 09/15/2015   PLT 349.0 09/15/2015   GLUCOSE 74 09/15/2015   CHOL 240* 09/15/2015   TRIG 200.0* 09/15/2015   HDL 59.20 09/15/2015   LDLCALC 141* 09/15/2015   ALT 14 09/15/2015   AST 12 09/15/2015   NA 139 09/15/2015   K 3.8 09/15/2015   CL 103 09/15/2015   CREATININE 0.72 09/15/2015   BUN 14 09/15/2015   CO2 28 09/15/2015   TSH 1.94 09/15/2015    Lab Results  Component Value Date   TSH 1.94 09/15/2015   Lab Results  Component Value Date   WBC 7.4 09/15/2015   HGB 12.4 09/15/2015   HCT 38.0 09/15/2015   MCV 90.3 09/15/2015   PLT 349.0 09/15/2015   Lab Results  Component Value Date   NA 139 09/15/2015   K 3.8 09/15/2015   CO2 28 09/15/2015   GLUCOSE 74 09/15/2015   BUN 14 09/15/2015   CREATININE 0.72  09/15/2015   BILITOT 0.2 09/15/2015   ALKPHOS 74 09/15/2015   AST 12 09/15/2015   ALT 14 09/15/2015   PROT 7.0 09/15/2015   ALBUMIN 4.3 09/15/2015   CALCIUM 9.7 09/15/2015   ANIONGAP 7 07/24/2015   GFR 87.57 09/15/2015   Lab Results  Component Value Date   CHOL 240* 09/15/2015   Lab Results  Component Value Date   HDL 59.20 09/15/2015   Lab Results  Component Value Date   LDLCALC 141* 09/15/2015   Lab Results  Component Value Date   TRIG 200.0* 09/15/2015  Lab Results  Component Value Date   CHOLHDL 4 09/15/2015   No results found for: HGBA1C     Assessment & Plan:   Problem List Items Addressed This Visit    COPD exacerbation (HCC) - Primary    Will do a course of Levaquin and Prednisone, increase Advair to 250 daily and refer to pulmonology      Relevant Medications   Fluticasone-Salmeterol (ADVAIR DISKUS) 250-50 MCG/DOSE AEPB   predniSONE (DELTASONE) 10 MG tablet   varenicline (CHANTIX STARTING MONTH PAK) 0.5 MG X 11 & 1 MG X 42 tablet   varenicline (CHANTIX CONTINUING MONTH PAK) 1 MG tablet   Other Relevant Orders   Ambulatory referral to Pulmonology   CBC   Comprehensive metabolic panel   Lipid panel   TSH   Lipid panel   CBC   Comprehensive metabolic panel   TSH   Depression    Sertraline has been helping some still struggles to some degree. Will add a 25 mg dose in the afternoon and patient will let us know if that is helpful. Continue the 100 mg in am      Relevant Medications   sertraline (ZOLOFT) 25 MG tablet   Essential hypertension    Start Amlodipine 2.5 mg daily      Relevant Medications   amLODipine (NORVASC) 2.5 MG tablet   Other Relevant Orders   Ambulatory referral to Pulmonology   CBC   Comprehensive metabolic panel   Lipid panel   TSH   Lipid panel   CBC   Comprehensive metabolic panel   TSH   GERD    Only Prevacid works well for patient but insurance is unwilling to pay for it, will try one more time, patient will  let us know if they do not pay for med again      Relevant Medications   lansoprazole (PREVACID) 30 MG capsule   Other Relevant Orders   CBC   Comprehensive metabolic panel   Lipid panel   TSH   Lipid panel   CBC   Comprehensive metabolic panel   TSH   Hyperlipidemia, mixed    Encouraged heart healthy diet, increase exercise, avoid trans fats, consider a krill oil cap daily      Relevant Medications   amLODipine (NORVASC) 2.5 MG tablet   Other Relevant Orders   Ambulatory referral to Pulmonology   CBC   Comprehensive metabolic panel   Lipid panel   TSH   Lipid panel   CBC   Comprehensive metabolic panel   TSH   Tobacco abuse    Encouraged complete cessation. Discussed need to quit as relates to risk of numerous cancers, cardiac and pulmonary disease as well as neurologic complications. Counseled for greater than 3 minutes. Is at 1/2 ppd. Will try Chantix and may use gum prn      Relevant Medications   varenicline (CHANTIX STARTING MONTH PAK) 0.5 MG X 11 & 1 MG X 42 tablet   varenicline (CHANTIX CONTINUING MONTH PAK) 1 MG tablet   Other Relevant Orders   Ambulatory referral to Pulmonology   CBC   Comprehensive metabolic panel   Lipid panel   TSH   Lipid panel   CBC   Comprehensive metabolic panel   TSH      I have discontinued Morgan Harper's lansoprazole, Fluticasone-Salmeterol, HYDROcodone-homatropine, doxycycline, and VENTOLIN HFA. I am also having her start on Fluticasone-Salmeterol, levofloxacin, varenicline, varenicline, lansoprazole, amLODipine, and sertraline. Additionally, I am having her maintain her  sertraline, albuterol, and predniSONE.  Meds ordered this encounter  Medications  . Fluticasone-Salmeterol (ADVAIR DISKUS) 250-50 MCG/DOSE AEPB    Sig: Inhale 1 puff into the lungs 2 (two) times daily.    Dispense:  1 each    Refill:  3  . predniSONE (DELTASONE) 10 MG tablet    Sig: 7 tab po day 1, 6 tab po day 2, 5 tab po day 3, 4 tab po day 4, 3 tab pod  day 5, 2 tab po day 6, 1 tab po day 7.    Dispense:  28 tablet    Refill:  0  . levofloxacin (LEVAQUIN) 500 MG tablet    Sig: Take 1 tablet (500 mg total) by mouth daily.    Dispense:  14 tablet    Refill:  1  . varenicline (CHANTIX STARTING MONTH PAK) 0.5 MG X 11 & 1 MG X 42 tablet    Sig: Take one 0.5 mg tablet by mouth once daily for 3 days, then increase to one 0.5 mg tablet twice daily for 4 days, then increase to one 1 mg tablet twice daily.    Dispense:  53 tablet    Refill:  0  . varenicline (CHANTIX CONTINUING MONTH PAK) 1 MG tablet    Sig: Take 1 tablet (1 mg total) by mouth 2 (two) times daily.    Dispense:  60 tablet    Refill:  4  . lansoprazole (PREVACID) 30 MG capsule    Sig: Take 1 capsule (30 mg total) by mouth daily at 12 noon.    Dispense:  30 capsule    Refill:  5    Failed protonix and prilosec.  Marland Kitchen amLODipine (NORVASC) 2.5 MG tablet    Sig: Take 1 tablet (2.5 mg total) by mouth daily.    Dispense:  30 tablet    Refill:  3  . sertraline (ZOLOFT) 25 MG tablet    Sig: Take 1 tablet (25 mg total) by mouth at bedtime.    Dispense:  30 tablet    Refill:  3     Danise Edge, MD

## 2016-01-20 ENCOUNTER — Telehealth: Payer: Self-pay | Admitting: *Deleted

## 2016-01-20 NOTE — Telephone Encounter (Signed)
Received fax request for PA on Chantix [starter pack], completed and faxed; awaiting response/SLS 01/20/16

## 2016-01-31 NOTE — Telephone Encounter (Signed)
So let patient know that insurance has declined to pay for Chantix unless she tries and fails a nicotine product. Please see if she has tried any and failed or if she has a preference for patches, gum or lozenges.

## 2016-01-31 NOTE — Telephone Encounter (Signed)
Received Denial letter for Chantix PA; "the request did not meet the conditions necessary for coverage for the following reasons; per health criteria for Chantix, can be approved if ALL of the following: (1) your pt has a Hx of failure, contraindication, or intolerance to one of the following: [a] Habitrol OTC, [b] Nicoderm CQ OTC, [c] Nicorette gum OTC, [d] Nicorette lozenges OTC, [e] Nicorette mini-lozenge OTC, [f] Thrive gum OTC, [g] Thrive lozenge OTC  AND (2) your patient has a history of failure, contraindication, or intolerance to Bupropion  Please note: these products may require Prior Authorization/SLS 04/04

## 2016-02-01 NOTE — Telephone Encounter (Signed)
Called to make patient aware.  No answer.  Left a message for call back.

## 2016-02-02 NOTE — Telephone Encounter (Signed)
Called the patient to inform, no answer and mailbox full.

## 2016-02-03 MED ORDER — NICOTINE 21 MG/24HR TD PT24: 21.0000 mg | MEDICATED_PATCH | Freq: Every day | TRANSDERMAL | Status: DC

## 2016-02-03 NOTE — Telephone Encounter (Signed)
Patient returning your call best # [phone]

## 2016-02-03 NOTE — Telephone Encounter (Signed)
Sent in as instructed 

## 2016-02-03 NOTE — Telephone Encounter (Signed)
Patient informed and she would like to try the patches.

## 2016-02-03 NOTE — Telephone Encounter (Signed)
Called left a detailed message to call back.

## 2016-02-03 NOTE — Telephone Encounter (Signed)
Ok to send in Rx Nicoderm CQ patches -- 1 month supply in PCP absence.

## 2016-02-06 NOTE — Telephone Encounter (Signed)
PA for Nicoderm CQ patches initiated on [email or web address]. Awaiting determination. JG//CMA

## 2016-02-08 NOTE — Telephone Encounter (Signed)
PA approved through 02/05/17. File ID: [id number]. JG//CMA

## 2016-02-20 ENCOUNTER — Encounter: Payer: Self-pay | Admitting: Physician Assistant

## 2016-02-20 ENCOUNTER — Ambulatory Visit (INDEPENDENT_AMBULATORY_CARE_PROVIDER_SITE_OTHER): Payer: 59 | Admitting: Physician Assistant

## 2016-02-20 VITALS — BP 148/80 | HR 81 | Temp 98.2°F | Ht 61.0 in | Wt 140.6 lb

## 2016-02-20 DIAGNOSIS — J209 Acute bronchitis, unspecified: Secondary | ICD-10-CM

## 2016-02-20 DIAGNOSIS — J440 Chronic obstructive pulmonary disease with acute lower respiratory infection: Secondary | ICD-10-CM | POA: Diagnosis not present

## 2016-02-20 MED ORDER — LEVOFLOXACIN 500 MG PO TABS: 500.0000 mg | ORAL_TABLET | Freq: Every day | ORAL | Status: DC

## 2016-02-20 MED ORDER — BENZONATATE 100 MG PO CAPS: 100.0000 mg | ORAL_CAPSULE | Freq: Three times a day (TID) | ORAL | Status: DC | PRN

## 2016-02-20 MED ORDER — PREDNISONE 20 MG PO TABS: 40.0000 mg | ORAL_TABLET | Freq: Every day | ORAL | Status: DC

## 2016-02-20 NOTE — Patient Instructions (Signed)
Take antibiotic (Levaquin) as directed.  Increase fluids.  Get plenty of rest. Use Mucinex for congestion. Use steroid burst as directed and Tessalon for cough. Take a daily probiotic (I recommend Align or Culturelle, but even Activia Yogurt may be beneficial).  A humidifier placed in the bedroom may offer some relief for a dry, scratchy throat of nasal irritation.  Read information below on acute bronchitis. Please call or return to clinic if symptoms are not improving.  Acute Bronchitis Bronchitis is when the airways that extend from the windpipe into the lungs get red, puffy, and painful (inflamed). Bronchitis often causes thick spit (mucus) to develop. This leads to a cough. A cough is the most common symptom of bronchitis. In acute bronchitis, the condition usually begins suddenly and goes away over time (usually in 2 weeks). Smoking, allergies, and asthma can make bronchitis worse. Repeated episodes of bronchitis may cause more lung problems.  HOME CARE  Rest.  Drink enough fluids to keep your pee (urine) clear or pale yellow (unless you need to limit fluids as told by your doctor).  Only take over-the-counter or prescription medicines as told by your doctor.  Avoid smoking and secondhand smoke. These can make bronchitis worse. If you are a smoker, think about using nicotine gum or skin patches. Quitting smoking will help your lungs heal faster.  Reduce the chance of getting bronchitis again by:  Washing your hands often.  Avoiding people with cold symptoms.  Trying not to touch your hands to your mouth, nose, or eyes.  Follow up with your doctor as told.  GET HELP IF: Your symptoms do not improve after 1 week of treatment. Symptoms include:  Cough.  Fever.  Coughing up thick spit.  Body aches.  Chest congestion.  Chills.  Shortness of breath.  Sore throat.  GET HELP RIGHT AWAY IF:   You have an increased fever.  You have chills.  You have severe shortness of  breath.  You have bloody thick spit (sputum).  You throw up (vomit) often.  You lose too much body fluid (dehydration).  You have a severe headache.  You faint.  MAKE SURE YOU:   Understand these instructions.  Will watch your condition.  Will get help right away if you are not doing well or get worse. Document Released: 04/02/2008 Document Revised: 06/17/2013 Document Reviewed: 04/07/2013 [facility] Patient Information 2015 [place], [place]. This information is not intended to replace advice given to you by your health care provider. Make sure you discuss any questions you have with your health care provider.

## 2016-02-20 NOTE — Progress Notes (Signed)
Patient presents to clinic today c/o 1 week of chest congestion and cough now productive of yellow sputum. Denies hest pain or SOB. Is unsure of fever. Is currently a smoker with history of COPD. Has not taken any OTC medications for symptoms. Has been using Advair as directed. Has been using albuterol inhaler over the past couple of days.  Past Medical History  Diagnosis Date  . Hypertension   . Insomnia   . Hiatal hernia   . GERD (gastroesophageal reflux disease)   . History of chicken pox   . H/O measles   . H/O mumps   . Hyperlipidemia, mixed 09/24/2015  . Depression 09/24/2015  . Chronic bronchitis (HCC)   . Anemia 01/13/2016    Current Outpatient Prescriptions on File Prior to Visit  Medication Sig Dispense Refill  . albuterol (PROVENTIL HFA;VENTOLIN HFA) 108 (90 Base) MCG/ACT inhaler Inhale 2 puffs into the lungs every 6 (six) hours as needed for wheezing. 1 Inhaler 2  . amLODipine (NORVASC) 2.5 MG tablet Take 1 tablet (2.5 mg total) by mouth daily. 30 tablet 3  . Fluticasone-Salmeterol (ADVAIR DISKUS) 250-50 MCG/DOSE AEPB Inhale 1 puff into the lungs 2 (two) times daily. 1 each 3  . lansoprazole (PREVACID) 30 MG capsule Take 1 capsule (30 mg total) by mouth daily at 12 noon. 30 capsule 5  . sertraline (ZOLOFT) 100 MG tablet Take 1 tablet (100 mg total) by mouth daily. 30 tablet 5  . sertraline (ZOLOFT) 25 MG tablet Take 1 tablet (25 mg total) by mouth at bedtime. 30 tablet 3  . nicotine (NICODERM CQ - DOSED IN MG/24 HOURS) 21 mg/24hr patch Place 1 patch (21 mg total) onto the skin daily. (Patient not taking: Reported on 02/20/2016) 28 patch 0  . varenicline (CHANTIX CONTINUING MONTH PAK) 1 MG tablet Take 1 tablet (1 mg total) by mouth 2 (two) times daily. (Patient not taking: Reported on 02/20/2016) 60 tablet 4  . varenicline (CHANTIX STARTING MONTH PAK) 0.5 MG X 11 & 1 MG X 42 tablet Take one 0.5 mg tablet by mouth once daily for 3 days, then increase to one 0.5 mg tablet twice  daily for 4 days, then increase to one 1 mg tablet twice daily. (Patient not taking: Reported on 02/20/2016) 53 tablet 0   No current facility-administered medications on file prior to visit.    No Known Allergies  Family History  Problem Relation Age of Onset  . Hypertension Mother   . Allergies Mother   . Asthma Mother   . COPD Mother     previous smoker  . Heart disease Neg Hx   . Diabetes Sister   . Alzheimer's disease Maternal Grandfather     Social History   Social History  . Marital Status: Married    Spouse Name: N/A  . Number of Children: N/A  . Years of Education: N/A   Social History Main Topics  . Smoking status: Current Some Day Smoker -- 1.00 packs/day for 45 years    Types: Cigarettes  . Smokeless tobacco: None  . Alcohol Use: No  . Drug Use: No  . 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 None   Social History Narrative   Review of Systems - See HPI.  All other ROS are negative.  BP 148/80 mmHg  Pulse 81  Temp(Src) 98.2 F (36.8 C) (Oral)  Ht   (1.549 m)  Wt 140 lb 9.6 oz (63.776 kg)  BMI 26.58 kg/m2  SpO2 98%  Physical Exam  Constitutional: She is oriented to person, place, and time and well-developed, well-nourished, and in no distress.  HENT:  Head: Normocephalic and atraumatic.  Right Ear: External ear normal.  Left Ear: External ear normal.  Nose: Nose normal.  Mouth/Throat: Oropharynx is clear and moist. No oropharyngeal exudate.  TM within normal limits bilaterally.  Eyes: Conjunctivae are normal.  Neck: Neck supple.  Cardiovascular: Normal rate, regular rhythm, normal heart sounds and intact distal pulses.   Pulmonary/Chest: Effort normal. No respiratory distress. She has wheezes. She has no rales. She exhibits no tenderness.  Abdominal: Soft. Bowel sounds are normal.  Neurological: She is alert and oriented to person, place,  and time.  Skin: Skin is warm and dry. No rash noted.  Psychiatric: Affect normal.  Vitals reviewed.   Recent Results (from the past 2160 hour(s))  CBC     Status: Abnormal   Collection Time: 01/13/16  8:53 AM  Result Value Ref Range   WBC 6.4 4.0 - 10.5 K/uL   RBC 4.03 3.87 - 5.11 Mil/uL   Platelets 307.0 150.0 - 400.0 K/uL   Hemoglobin 11.8 (L) 12.0 - 15.0 g/dL   HCT 16.1 09.6 - 04.5 %   MCV 89.9 78.0 - 100.0 fl   MCHC 32.5 30.0 - 36.0 g/dL   RDW 40.9 (H) 81.1 - 91.4 %  Comprehensive metabolic panel     Status: None   Collection Time: 01/13/16  8:53 AM  Result Value Ref Range   Sodium 141 135 - 145 mEq/L   Potassium 4.0 3.5 - 5.1 mEq/L   Chloride 107 96 - 112 mEq/L   CO2 26 19 - 32 mEq/L   Glucose, Bld 81 70 - 99 mg/dL   BUN 17 6 - 23 mg/dL   Creatinine, Ser 7.82 0.40 - 1.20 mg/dL   Total Bilirubin 0.3 0.2 - 1.2 mg/dL   Alkaline Phosphatase 68 39 - 117 U/L   AST 10 0 - 37 U/L   ALT 11 0 - 35 U/L   Total Protein 6.9 6.0 - 8.3 g/dL   Albumin 4.2 3.5 - 5.2 g/dL   Calcium 9.5 8.4 - 95.6 mg/dL   GFR 21.30 >86.57 mL/min  Lipid panel     Status: Abnormal   Collection Time: 01/13/16  8:53 AM  Result Value Ref Range   Cholesterol 215 (H) 0 - 200 mg/dL    Comment: ATP III Classification       Desirable:  < 200 mg/dL               Borderline High:  200 - 239 mg/dL          High:  > = 846 mg/dL   Triglycerides 96.2 0.0 - 149.0 mg/dL    Comment: Normal:  <952 mg/dLBorderline High:  150 - 199 mg/dL   HDL 84.13 >24.40 mg/dL   VLDL 10.2 0.0 - 72.5 mg/dL   LDL Cholesterol 366 (H) 0 - 99 mg/dL   Total CHOL/HDL Ratio 3     Comment:                Men          Women1/2 Average Risk     3.4          3.3Average Risk          5.0  4.42X Average Risk          9.6          7.13X Average Risk          15.0          11.0                       NonHDL 153.08     Comment: NOTE:  Non-HDL goal should be 30 mg/dL higher than patient's LDL goal (i.e. LDL goal of < 70 mg/dL, would have  non-HDL goal of < 100 mg/dL)  TSH     Status: None   Collection Time: 01/13/16  8:53 AM  Result Value Ref Range   TSH 1.09 0.35 - 4.50 uIU/mL    Assessment/Plan: 1. COPD with acute bronchitis (HCC) Wheezing noted. Albuterol neb given x 1 with improvement in breathing. O2 at 98%. Will begin ABX, steroid burst and cough suppressant. Supportive measures and OTC medications reviewed.  - levofloxacin (LEVAQUIN) 500 MG tablet; Take 1 tablet (500 mg total) by mouth daily.  Dispense: 7 tablet; Refill: 0 - predniSONE (DELTASONE) 20 MG tablet; Take 2 tablets (40 mg total) by mouth daily with breakfast.  Dispense: 10 tablet; Refill: 0 - benzonatate (TESSALON) 100 MG capsule; Take 1 capsule (100 mg total) by mouth 3 (three) times daily as needed.  Dispense: 30 capsule; Refill: 0

## 2016-02-21 ENCOUNTER — Telehealth: Payer: Self-pay | Admitting: Physician Assistant

## 2016-02-21 MED ORDER — PREDNISONE 10 MG PO TABS: ORAL_TABLET | ORAL | Status: DC

## 2016-02-21 NOTE — Telephone Encounter (Signed)
Can be reached: [phone] Pharmacy: [facility] - [place], [place] - [address]  Reason for call: Pt called in stating the 2 ud per day prednisone is not going to work and she wants to have the prednisone tapered dose pack ordered.

## 2016-02-21 NOTE — Telephone Encounter (Signed)
Call patient to assess further. Ok to send in a medrol dose pack if she would like.

## 2016-02-21 NOTE — Telephone Encounter (Signed)
Called and spoke with the pt and she stated that she had been on the prednisone dose that was given to her on yesterday before, and it did not work for her.  Pt is requesting the prednisone taper that was given to her by Dr. Abner GreenspanBlyth.  Informed her that Selena BattenCody approved it.  Prednisone taper was refilled and sent to the pharmacy.  Pt aware.//AB/CMA

## 2016-02-27 ENCOUNTER — Other Ambulatory Visit: Payer: Self-pay

## 2016-02-27 DIAGNOSIS — J441 Chronic obstructive pulmonary disease with (acute) exacerbation: Secondary | ICD-10-CM

## 2016-02-27 MED ORDER — IPRATROPIUM-ALBUTEROL 0.5-2.5 (3) MG/3ML IN SOLN: 3.0000 mL | Freq: Once | RESPIRATORY_TRACT | Status: DC

## 2016-02-27 MED ORDER — IPRATROPIUM-ALBUTEROL 0.5-2.5 (3) MG/3ML IN SOLN
3.0000 mL | Freq: Once | RESPIRATORY_TRACT | Status: AC
  Administered 2016-02-20: 3 mL via RESPIRATORY_TRACT

## 2016-02-27 NOTE — Progress Notes (Signed)
Pre visit review using our clinic review tool, if applicable. No additional management support is needed unless otherwise documented below in the visit note. 

## 2016-02-27 NOTE — Addendum Note (Signed)
Addended by: Erin HearingHEEK, Islay Polanco N on: 02/27/2016 01:38 PM   Modules accepted: Orders

## 2016-03-15 ENCOUNTER — Encounter: Payer: 59 | Admitting: Family Medicine

## 2016-03-15 ENCOUNTER — Telehealth: Payer: Self-pay | Admitting: Family Medicine

## 2016-03-20 NOTE — Telephone Encounter (Signed)
Pt was no show 03/15/16 for cpe appt, 1st no show, pt has not rescheduled, charge or no charge?

## 2016-03-20 NOTE — Telephone Encounter (Signed)
No charge. 

## 2016-03-30 ENCOUNTER — Other Ambulatory Visit: Payer: Self-pay | Admitting: Family Medicine

## 2016-03-30 MED FILL — VENTOLIN HFA 90 MCG INHALER: 108 (90 BAS | 30 days supply | Qty: 18 | Fill #1

## 2016-04-03 ENCOUNTER — Encounter: Payer: Self-pay | Admitting: Physician Assistant

## 2016-04-03 ENCOUNTER — Ambulatory Visit (INDEPENDENT_AMBULATORY_CARE_PROVIDER_SITE_OTHER): Payer: 59 | Admitting: Physician Assistant

## 2016-04-03 VITALS — BP 137/68 | HR 81 | Temp 98.3°F | Resp 18 | Ht 61.0 in | Wt 142.1 lb

## 2016-04-03 DIAGNOSIS — J440 Chronic obstructive pulmonary disease with acute lower respiratory infection: Secondary | ICD-10-CM

## 2016-04-03 DIAGNOSIS — J441 Chronic obstructive pulmonary disease with (acute) exacerbation: Secondary | ICD-10-CM | POA: Diagnosis not present

## 2016-04-03 DIAGNOSIS — J209 Acute bronchitis, unspecified: Secondary | ICD-10-CM

## 2016-04-03 MED ORDER — LEVOFLOXACIN 500 MG PO TABS: 500.0000 mg | ORAL_TABLET | Freq: Every day | ORAL | Status: DC

## 2016-04-03 MED ORDER — PREDNISONE 10 MG PO TABS: ORAL_TABLET | ORAL | Status: DC

## 2016-04-03 MED FILL — predniSONE 10 MG TABS: 10 | 7 days supply | Qty: 28 | Fill #0

## 2016-04-03 MED FILL — levoFLOXacin 500 MG TABS: 500 | 7 days supply | Qty: 7 | Fill #0

## 2016-04-03 NOTE — Progress Notes (Signed)
Pre visit review using our clinic review tool, if applicable. No additional management support is needed unless otherwise documented below in the visit note/SLS  

## 2016-04-03 NOTE — Patient Instructions (Signed)
Take antibiotic (Levaquin) as directed.  Increase fluids.  Get plenty of rest. Use Mucinex for congestion. Take steroid taper asd irected. Continue chronic medications. Take a daily probiotic (I recommend Align or Culturelle, but even Activia Yogurt may be beneficial).  A humidifier placed in the bedroom may offer some relief for a dry, scratchy throat of nasal irritation.  Read information below on acute bronchitis. Please call or return to clinic if symptoms are not improving.  You will be contacted by Pulmonology. Follow-up with Dr. Abner GreenspanBlyth in 1 week.  Acute Bronchitis Bronchitis is when the airways that extend from the windpipe into the lungs get red, puffy, and painful (inflamed). Bronchitis often causes thick spit (mucus) to develop. This leads to a cough. A cough is the most common symptom of bronchitis. In acute bronchitis, the condition usually begins suddenly and goes away over time (usually in 2 weeks). Smoking, allergies, and asthma can make bronchitis worse. Repeated episodes of bronchitis may cause more lung problems.  HOME CARE  Rest.  Drink enough fluids to keep your pee (urine) clear or pale yellow (unless you need to limit fluids as told by your doctor).  Only take over-the-counter or prescription medicines as told by your doctor.  Avoid smoking and secondhand smoke. These can make bronchitis worse. If you are a smoker, think about using nicotine gum or skin patches. Quitting smoking will help your lungs heal faster.  Reduce the chance of getting bronchitis again by:  Washing your hands often.  Avoiding people with cold symptoms.  Trying not to touch your hands to your mouth, nose, or eyes.  Follow up with your doctor as told.  GET HELP IF: Your symptoms do not improve after 1 week of treatment. Symptoms include:  Cough.  Fever.  Coughing up thick spit.  Body aches.  Chest congestion.  Chills.  Shortness of breath.  Sore throat.  GET HELP RIGHT AWAY IF:    You have an increased fever.  You have chills.  You have severe shortness of breath.  You have bloody thick spit (sputum).  You throw up (vomit) often.  You lose too much body fluid (dehydration).  You have a severe headache.  You faint.  MAKE SURE YOU:   Understand these instructions.  Will watch your condition.  Will get help right away if you are not doing well or get worse. Document Released: 04/02/2008 Document Revised: 06/17/2013 Document Reviewed: 04/07/2013 [facility] Patient Information 2015 [employer] [place] [employer] [place]. This information is not intended to replace advice given to you by your health care provider. Make sure you discuss any questions you have with your health care provider.

## 2016-04-03 NOTE — Progress Notes (Signed)
Patient with history of COPD presents to clinic today c/o 5 days of cough with chest congestion. Cough is productive now of green sputum. Notes chest tightness, wheeze and SOB without chest pain. Denies fever, chills, recent travel or sick contact. Patient is a current smoker. Has been referred to Pulmonology several times but has canceled referrals. Agree to referral at present giving recurrence of symptoms.  Past Medical History  Diagnosis Date  . Hypertension   . Insomnia   . Hiatal hernia   . GERD (gastroesophageal reflux disease)   . History of chicken pox   . H/O measles   . H/O mumps   . Hyperlipidemia, mixed 09/24/2015  . Depression 09/24/2015  . Chronic bronchitis (HCC)   . Anemia 01/13/2016    Current Outpatient Prescriptions on File Prior to Visit  Medication Sig Dispense Refill  . amLODipine (NORVASC) 2.5 MG tablet Take 1 tablet (2.5 mg total) by mouth daily. 30 tablet 3  . Fluticasone-Salmeterol (ADVAIR DISKUS) 250-50 MCG/DOSE AEPB Inhale 1 puff into the lungs 2 (two) times daily. 1 each 3  . lansoprazole (PREVACID) 30 MG capsule Take 1 capsule (30 mg total) by mouth daily at 12 noon. (Patient taking differently: Take 30 mg by mouth daily at 12 noon. OTC d/t Insurance non-coverage) 30 capsule 5  . sertraline (ZOLOFT) 100 MG tablet Take 1 tablet (100 mg total) by mouth daily. 30 tablet 5  . sertraline (ZOLOFT) 25 MG tablet Take 1 tablet (25 mg total) by mouth at bedtime. 30 tablet 3  . VENTOLIN HFA 108 (90 Base) MCG/ACT inhaler INHALE 2 PUFFS INTO THE LUNGS EVERY 6 HOURS AS NEEDED FOR WHEEZING 18 each 4  . nicotine (NICODERM CQ - DOSED IN MG/24 HOURS) 21 mg/24hr patch Place 1 patch (21 mg total) onto the skin daily. (Patient not taking: Reported on 02/20/2016) 28 patch 0  . varenicline (CHANTIX CONTINUING MONTH PAK) 1 MG tablet Take 1 tablet (1 mg total) by mouth 2 (two) times daily. (Patient not taking: Reported on 02/20/2016) 60 tablet 4  . varenicline (CHANTIX STARTING  MONTH PAK) 0.5 MG X 11 & 1 MG X 42 tablet Take one 0.5 mg tablet by mouth once daily for 3 days, then increase to one 0.5 mg tablet twice daily for 4 days, then increase to one 1 mg tablet twice daily. (Patient not taking: Reported on 02/20/2016) 53 tablet 0   No current facility-administered medications on file prior to visit.    No Known Allergies  Family History  Problem Relation Age of Onset  . Hypertension Mother   . Allergies Mother   . Asthma Mother   . COPD Mother     previous smoker  . Heart disease Neg Hx   . Diabetes Sister   . Alzheimer's disease Maternal Grandfather     Social History   Social History  . Marital Status: Married    Spouse Name: N/A  . Number of Children: N/A  . Years of Education: N/A   Social History Main Topics  . Smoking status: Current Some Day Smoker -- 1.00 packs/day for 45 years    Types: Cigarettes  . Smokeless tobacco: None  . Alcohol Use: No  . Drug Use: No  . 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 None   Social History Narrative   Review of Systems - See HPI.  All other  ROS are negative.  BP 137/68 mmHg  Pulse 81  Temp(Src) 98.3 F (36.8 C) (Oral)  Resp 18  Ht 5\' 1"  (1.549 m)  Wt 142 lb 2 oz (64.467 kg)  BMI 26.87 kg/m2  SpO2 94%  Physical Exam  Constitutional: She is oriented to person, place, and time and well-developed, well-nourished, and in no distress.  HENT:  Head: Normocephalic and atraumatic.  Eyes: Conjunctivae are normal.  Neck: Neck supple.  Cardiovascular: Normal rate, regular rhythm, normal heart sounds and intact distal pulses.   Pulmonary/Chest: Effort normal. No respiratory distress. She has wheezes. She has no rales. She exhibits no tenderness.  Neurological: She is alert and oriented to person, place, and time.  Skin: Skin is warm and dry. No rash noted.  Psychiatric: Affect normal.  Vitals  reviewed.  Recent Results (from the past 2160 hour(s))  CBC     Status: Abnormal   Collection Time: 01/13/16  8:53 AM  Result Value Ref Range   WBC 6.4 4.0 - 10.5 K/uL   RBC 4.03 3.87 - 5.11 Mil/uL   Platelets 307.0 150.0 - 400.0 K/uL   Hemoglobin 11.8 (L) 12.0 - 15.0 g/dL   HCT 45.436.3 09.836.0 - 11.946.0 %   MCV 89.9 78.0 - 100.0 fl   MCHC 32.5 30.0 - 36.0 g/dL   RDW 14.716.3 (H) 82.911.5 - 56.215.5 %  Comprehensive metabolic panel     Status: None   Collection Time: 01/13/16  8:53 AM  Result Value Ref Range   Sodium 141 135 - 145 mEq/L   Potassium 4.0 3.5 - 5.1 mEq/L   Chloride 107 96 - 112 mEq/L   CO2 26 19 - 32 mEq/L   Glucose, Bld 81 70 - 99 mg/dL   BUN 17 6 - 23 mg/dL   Creatinine, Ser 1.300.75 0.40 - 1.20 mg/dL   Total Bilirubin 0.3 0.2 - 1.2 mg/dL   Alkaline Phosphatase 68 39 - 117 U/L   AST 10 0 - 37 U/L   ALT 11 0 - 35 U/L   Total Protein 6.9 6.0 - 8.3 g/dL   Albumin 4.2 3.5 - 5.2 g/dL   Calcium 9.5 8.4 - 86.510.5 mg/dL   GFR 78.4683.45 >96.29>60.00 mL/min  Lipid panel     Status: Abnormal   Collection Time: 01/13/16  8:53 AM  Result Value Ref Range   Cholesterol 215 (H) 0 - 200 mg/dL    Comment: ATP III Classification       Desirable:  < 200 mg/dL               Borderline High:  200 - 239 mg/dL          High:  > = 528240 mg/dL   Triglycerides 41.387.0 0.0 - 149.0 mg/dL    Comment: Normal:  <244<150 mg/dLBorderline High:  150 - 199 mg/dL   HDL 01.0262.20 >72.53>39.00 mg/dL   VLDL 66.417.4 0.0 - 40.340.0 mg/dL   LDL Cholesterol 474136 (H) 0 - 99 mg/dL   Total CHOL/HDL Ratio 3     Comment:                Men          Women1/2 Average Risk     3.4          3.3Average Risk          5.0          4.42X Average Risk          9.6  7.13X Average Risk          15.0          11.0                       NonHDL 153.08     Comment: NOTE:  Non-HDL goal should be 30 mg/dL higher than patient's LDL goal (i.e. LDL goal of < 70 mg/dL, would have non-HDL goal of < 100 mg/dL)  TSH     Status: None   Collection Time: 01/13/16  8:53 AM  Result  Value Ref Range   TSH 1.09 0.35 - 4.50 uIU/mL    Assessment/Plan:  COPD exacerbation with acute bronchitis (HCC) Albuterol nebulizer given in office. Will continue chronic COPD medications. Rx Levaquin and steroid taper. Patient has been noncompliant with previous recommendations for smoking cessation and need for Pulmonologist. 3 referrals have been placed previously - 2 by PCP and 1 by this provider -- all canceled. Reviewed need for LFTs and Pulmonology assessment. She agrees. Final referral placed. She is to follow-up with PCP for ongoing management. FU 1 week with PCP.  - predniSONE (DELTASONE) 10 MG tablet; 7 tab po day 1, 6 tab po day 2, 5 tab po day 3, 4 tab po day 4, 3 tab pod day 5, 2 tab po day 6, 1 tab po day 7.  Dispense: 28 tablet; Refill: 0 - levofloxacin (LEVAQUIN) 500 MG tablet; Take 1 tablet (500 mg total) by mouth daily.  Dispense: 7 tablet; Refill: 0 - Ambulatory referral to Pulmonology

## 2016-04-11 ENCOUNTER — Inpatient Hospital Stay (HOSPITAL_BASED_OUTPATIENT_CLINIC_OR_DEPARTMENT_OTHER)
Admission: EM | Admit: 2016-04-11 | Discharge: 2016-04-14 | DRG: 190 | Disposition: A | Discharge status: Home / Self Care | Payer: 59 | Attending: Internal Medicine | Admitting: Internal Medicine

## 2016-04-11 ENCOUNTER — Encounter (HOSPITAL_BASED_OUTPATIENT_CLINIC_OR_DEPARTMENT_OTHER): Payer: Self-pay

## 2016-04-11 ENCOUNTER — Emergency Department (HOSPITAL_BASED_OUTPATIENT_CLINIC_OR_DEPARTMENT_OTHER): Payer: 59

## 2016-04-11 DIAGNOSIS — E782 Mixed hyperlipidemia: Secondary | ICD-10-CM | POA: Diagnosis present

## 2016-04-11 DIAGNOSIS — F17210 Nicotine dependence, cigarettes, uncomplicated: Secondary | ICD-10-CM | POA: Diagnosis present

## 2016-04-11 DIAGNOSIS — K219 Gastro-esophageal reflux disease without esophagitis: Secondary | ICD-10-CM

## 2016-04-11 DIAGNOSIS — F32A Depression, unspecified: Secondary | ICD-10-CM | POA: Diagnosis present

## 2016-04-11 DIAGNOSIS — G4700 Insomnia, unspecified: Secondary | ICD-10-CM | POA: Diagnosis present

## 2016-04-11 DIAGNOSIS — Z825 Family history of asthma and other chronic lower respiratory diseases: Secondary | ICD-10-CM | POA: Diagnosis not present

## 2016-04-11 DIAGNOSIS — J441 Chronic obstructive pulmonary disease with (acute) exacerbation: Secondary | ICD-10-CM | POA: Diagnosis present

## 2016-04-11 DIAGNOSIS — Z8249 Family history of ischemic heart disease and other diseases of the circulatory system: Secondary | ICD-10-CM | POA: Diagnosis not present

## 2016-04-11 DIAGNOSIS — R0600 Dyspnea, unspecified: Secondary | ICD-10-CM | POA: Diagnosis not present

## 2016-04-11 DIAGNOSIS — D649 Anemia, unspecified: Secondary | ICD-10-CM | POA: Diagnosis present

## 2016-04-11 DIAGNOSIS — Z79899 Other long term (current) drug therapy: Secondary | ICD-10-CM | POA: Diagnosis not present

## 2016-04-11 DIAGNOSIS — I10 Essential (primary) hypertension: Secondary | ICD-10-CM | POA: Diagnosis present

## 2016-04-11 DIAGNOSIS — J9601 Acute respiratory failure with hypoxia: Secondary | ICD-10-CM | POA: Diagnosis present

## 2016-04-11 DIAGNOSIS — F329 Major depressive disorder, single episode, unspecified: Secondary | ICD-10-CM | POA: Diagnosis present

## 2016-04-11 DIAGNOSIS — Z720 Tobacco use: Secondary | ICD-10-CM | POA: Diagnosis present

## 2016-04-11 LAB — CBC WITH DIFFERENTIAL/PLATELET
BASOS PCT: 0 %
Basophils Absolute: 0 10*3/uL (ref 0.0–0.1)
EOS ABS: 1.1 10*3/uL — AB (ref 0.0–0.7)
EOS PCT: 10 %
HCT: 36.6 % (ref 36.0–46.0)
HEMOGLOBIN: 11.6 g/dL — AB (ref 12.0–15.0)
LYMPHS ABS: 2.7 10*3/uL (ref 0.7–4.0)
Lymphocytes Relative: 25 %
MCH: 29.3 pg (ref 26.0–34.0)
MCHC: 31.7 g/dL (ref 30.0–36.0)
MCV: 92.4 fL (ref 78.0–100.0)
MONO ABS: 0.8 10*3/uL (ref 0.1–1.0)
MONOS PCT: 8 %
NEUTROS PCT: 57 %
Neutro Abs: 6.2 10*3/uL (ref 1.7–7.7)
Platelets: 315 10*3/uL (ref 150–400)
RBC: 3.96 MIL/uL (ref 3.87–5.11)
RDW: 14.9 % (ref 11.5–15.5)
WBC: 10.9 10*3/uL — ABNORMAL HIGH (ref 4.0–10.5)

## 2016-04-11 LAB — BASIC METABOLIC PANEL
Anion gap: 7 (ref 5–15)
BUN: 15 mg/dL (ref 6–20)
CALCIUM: 9 mg/dL (ref 8.9–10.3)
CHLORIDE: 106 mmol/L (ref 101–111)
CO2: 27 mmol/L (ref 22–32)
CREATININE: 0.75 mg/dL (ref 0.44–1.00)
GFR calc Af Amer: >60 mL/min (ref >60)
GFR calc non Af Amer: >60 mL/min (ref >60)
GLUCOSE: 123 mg/dL — AB (ref 65–99)
Potassium: 3.5 mmol/L (ref 3.5–5.1)
Sodium: 140 mmol/L (ref 135–145)

## 2016-04-11 LAB — I-STAT VENOUS BLOOD GAS, ED
ACID-BASE EXCESS: 2 mmol/L (ref 0.0–2.0)
Bicarbonate: 26.2 mEq/L — ABNORMAL HIGH (ref 20.0–24.0)
O2 SAT: 92 %
PCO2 VEN: 39.7 mmHg — AB (ref 45.0–50.0)
PH VEN: 7.428 — AB (ref 7.250–7.300)
TCO2: 27 mmol/L (ref 0–100)
pO2, Ven: 63 mmHg — ABNORMAL HIGH (ref 31.0–45.0)

## 2016-04-11 MED ORDER — METHYLPREDNISOLONE SODIUM SUCC 125 MG IJ SOLR
60.0000 mg | Freq: Three times a day (TID) | INTRAMUSCULAR | Status: DC
  Administered 2016-04-11 – 2016-04-12 (×3): 60 mg via INTRAVENOUS
  Filled 2016-04-11 (×3): qty 2

## 2016-04-11 MED ORDER — ENOXAPARIN SODIUM 40 MG/0.4ML ~~LOC~~ SOLN
40.0000 mg | SUBCUTANEOUS | Status: DC
  Administered 2016-04-11 – 2016-04-13 (×3): 40 mg via SUBCUTANEOUS
  Filled 2016-04-11 (×2): qty 0.4

## 2016-04-11 MED ORDER — IPRATROPIUM BROMIDE 0.02 % IN SOLN
RESPIRATORY_TRACT | Status: AC
  Administered 2016-04-11: 0.5 mg
  Filled 2016-04-11: qty 2.5

## 2016-04-11 MED ORDER — ALBUTEROL SULFATE (2.5 MG/3ML) 0.083% IN NEBU
INHALATION_SOLUTION | RESPIRATORY_TRACT | Status: AC
  Filled 2016-04-11: qty 3

## 2016-04-11 MED ORDER — SERTRALINE HCL 100 MG PO TABS
100.0000 mg | ORAL_TABLET | Freq: Every day | ORAL | Status: DC
  Administered 2016-04-12 – 2016-04-14 (×3): 100 mg via ORAL
  Filled 2016-04-11 (×3): qty 1

## 2016-04-11 MED ORDER — AZITHROMYCIN 500 MG PO TABS
500.0000 mg | ORAL_TABLET | Freq: Every day | ORAL | Status: AC
  Administered 2016-04-12: 500 mg via ORAL
  Filled 2016-04-11: qty 1

## 2016-04-11 MED ORDER — NICOTINE 21 MG/24HR TD PT24
21.0000 mg | MEDICATED_PATCH | Freq: Every day | TRANSDERMAL | Status: DC
  Administered 2016-04-12 – 2016-04-14 (×3): 21 mg via TRANSDERMAL
  Filled 2016-04-11 (×3): qty 1

## 2016-04-11 MED ORDER — DM-GUAIFENESIN ER 30-600 MG PO TB12
1.0000 | ORAL_TABLET | Freq: Two times a day (BID) | ORAL | Status: DC
  Administered 2016-04-11 – 2016-04-14 (×6): 1 via ORAL
  Filled 2016-04-11 (×6): qty 1

## 2016-04-11 MED ORDER — IPRATROPIUM-ALBUTEROL 0.5-2.5 (3) MG/3ML IN SOLN
3.0000 mL | RESPIRATORY_TRACT | Status: DC
  Administered 2016-04-11 (×2): 3 mL via RESPIRATORY_TRACT
  Filled 2016-04-11 (×2): qty 3

## 2016-04-11 MED ORDER — ALBUTEROL SULFATE (2.5 MG/3ML) 0.083% IN NEBU
2.5000 mg | INHALATION_SOLUTION | RESPIRATORY_TRACT | Status: DC | PRN
  Administered 2016-04-13 – 2016-04-14 (×2): 2.5 mg via RESPIRATORY_TRACT
  Filled 2016-04-11 (×3): qty 3

## 2016-04-11 MED ORDER — ALBUTEROL (5 MG/ML) CONTINUOUS INHALATION SOLN
15.0000 mg/h | INHALATION_SOLUTION | RESPIRATORY_TRACT | Status: AC
Start: 2016-04-11 — End: 2016-04-11
  Administered 2016-04-11: 15 mg/h via RESPIRATORY_TRACT

## 2016-04-11 MED ORDER — AZITHROMYCIN 500 MG PO TABS
250.0000 mg | ORAL_TABLET | Freq: Every day | ORAL | Status: DC
  Administered 2016-04-13 – 2016-04-14 (×2): 250 mg via ORAL
  Filled 2016-04-11 (×2): qty 1

## 2016-04-11 MED ORDER — PANTOPRAZOLE SODIUM 20 MG PO TBEC
20.0000 mg | DELAYED_RELEASE_TABLET | Freq: Every day | ORAL | Status: DC
  Administered 2016-04-12 – 2016-04-14 (×3): 20 mg via ORAL
  Filled 2016-04-11 (×3): qty 1

## 2016-04-11 MED ORDER — METHYLPREDNISOLONE SODIUM SUCC 125 MG IJ SOLR
125.0000 mg | Freq: Once | INTRAMUSCULAR | Status: AC
  Administered 2016-04-11: 125 mg via INTRAVENOUS
  Filled 2016-04-11: qty 2

## 2016-04-11 MED ORDER — IPRATROPIUM-ALBUTEROL 0.5-2.5 (3) MG/3ML IN SOLN: 3.0000 mL | RESPIRATORY_TRACT | Status: DC

## 2016-04-11 MED ORDER — AMLODIPINE BESYLATE 5 MG PO TABS
2.5000 mg | ORAL_TABLET | Freq: Every day | ORAL | Status: DC
  Administered 2016-04-12: 2.5 mg via ORAL
  Filled 2016-04-11: qty 1

## 2016-04-11 NOTE — ED Notes (Signed)
Attempted report to [facility], states will call back.

## 2016-04-11 NOTE — ED Notes (Signed)
Husband to bedside. Given something to drink.

## 2016-04-11 NOTE — ED Provider Notes (Signed)
CSN: [id number]     Arrival date & time 04/11/16  1501 History   First MD Initiated Contact with Patient 04/11/16 1514     Chief Complaint  Patient presents with  . Cough     (Consider location/radiation/quality/duration/timing/severity/associated sxs/prior Treatment) HPI Patient has history of COPD. She started treatment last week. She has had a steroid taper which she has nearly finished and Levaquin which she has already taken for 5 days. Her wheezing and chest tightness have gotten worse. She reports she continues to have productive cough. No fevers and no chest pain. She is using her home inhalers without relief. She does not have nebulizer machine. Patient does continue to smoke although she has not been able to for the past several days. No lower extremity swelling or calf pain. It is similar symptoms in the past Past Medical History  Diagnosis Date  . Hypertension   . Insomnia   . Hiatal hernia   . GERD (gastroesophageal reflux disease)   . History of chicken pox   . H/O measles   . H/O mumps   . Hyperlipidemia, mixed 09/24/2015  . Depression 09/24/2015  . Chronic bronchitis (HCC)   . Anemia 01/13/2016   Past Surgical History  Procedure Laterality Date  . Tonsillectomy     Family History  Problem Relation Age of Onset  . Hypertension Mother   . Allergies Mother   . Asthma Mother   . COPD Mother     previous smoker  . Heart disease Neg Hx   . Diabetes Sister   . Alzheimer's disease Maternal Grandfather    Social History  Substance Use Topics  . Smoking status: Current Some Day Smoker -- 1.00 packs/day for 45 years    Types: Cigarettes  . Smokeless tobacco: None  . Alcohol Use: No   OB History    No data available     Review of Systems  10 Systems reviewed and are negative for acute change except as noted in the HPI.   Allergies  Review of patient's allergies indicates no known allergies.  Home Medications   Prior to Admission medications    Medication Sig Start Date End Date Taking? Authorizing Provider  sertraline (ZOLOFT) 100 MG tablet Take 1 tablet (100 mg total) by mouth daily. 09/15/15  Yes Bradd Canary, MD  amLODipine (NORVASC) 10 MG tablet Take 1 tablet (10 mg total) by mouth daily. 04/14/16   Drema Dallas, MD  buPROPion (WELLBUTRIN SR) 150 MG 12 hr tablet Take 1 tablet (150 mg total) by mouth 2 (two) times daily. 04/14/16   Drema Dallas, MD  Fluticasone-Salmeterol (ADVAIR DISKUS) 500-50 MCG/DOSE AEPB Inhale 1 puff into the lungs 2 (two) times daily. 04/14/16   Drema Dallas, MD  lisinopril (PRINIVIL,ZESTRIL) 2.5 MG tablet Take 1 tablet (2.5 mg total) by mouth daily. 04/14/16   Drema Dallas, MD   BP 140/74 mmHg  Pulse 90  Temp(Src) 98.5 F (36.9 C) (Oral)  Resp 22  Ht  (1.549 m)  Wt 143 lb 4.8 oz (65 kg)  BMI 27.09 kg/m2  SpO2 96% Physical Exam  Constitutional: She is oriented to person, place, and time. She appears well-developed and well-nourished.  Moderate increased work of breathing. Patient is alert and nontoxic. Speaking in short full sentences.  HENT:  Head: Normocephalic and atraumatic.  Mouth/Throat: Oropharynx is clear and moist.  Eyes: EOM are normal. Pupils are equal, round, and reactive to light.  Neck: Neck supple.  Cardiovascular:  Normal rate, regular rhythm, normal heart sounds and intact distal pulses.   Pulmonary/Chest:  Moderate increased work of breathing. Expiratory wheeze bilaterally. Decreased breath sounds at the bases. No rhonchi or rail.  Abdominal: Soft. Bowel sounds are normal. She exhibits no distension. There is no tenderness.  Musculoskeletal: Normal range of motion. She exhibits no edema or tenderness.  Neurological: She is alert and oriented to person, place, and time. She has normal strength. Coordination normal. GCS eye subscore is 4. GCS verbal subscore is 5. GCS motor subscore is 6.  Skin: Skin is warm, dry and intact.  Psychiatric: She has a normal mood and affect.     ED Course  Procedures (including critical care time) Labs Review Labs Reviewed  BASIC METABOLIC PANEL - Abnormal; Notable for the following:    Glucose, Bld 123 (*)    All other components within normal limits  CBC WITH DIFFERENTIAL/PLATELET - Abnormal; Notable for the following:    WBC 10.9 (*)    Hemoglobin 11.6 (*)    Eosinophils Absolute 1.1 (*)    All other components within normal limits  CBC WITH DIFFERENTIAL/PLATELET - Abnormal; Notable for the following:    WBC 13.3 (*)    RBC 3.84 (*)    Hemoglobin 10.7 (*)    HCT 34.6 (*)    Neutro Abs 10.2 (*)    All other components within normal limits  BASIC METABOLIC PANEL - Abnormal; Notable for the following:    Glucose, Bld 104 (*)    All other components within normal limits  I-STAT VENOUS BLOOD GAS, ED - Abnormal; Notable for the following:    pH, Ven 7.428 (*)    pCO2, Ven 39.7 (*)    pO2, Ven 63.0 (*)    Bicarbonate 26.2 (*)    All other components within normal limits  RESPIRATORY PANEL BY PCR  CULTURE, BLOOD (ROUTINE X 2)  CULTURE, BLOOD (ROUTINE X 2)  MRSA PCR SCREENING  CULTURE, EXPECTORATED SPUTUM-ASSESSMENT  GRAM STAIN  HIV ANTIBODY (ROUTINE TESTING)  TROPONIN I  TROPONIN I  TROPONIN I  STREP PNEUMONIAE URINARY ANTIGEN  LEGIONELLA PNEUMOPHILA SEROGP 1 UR AG  MAGNESIUM  I-STAT ARTERIAL BLOOD GAS, ED    Imaging Review No results found. I have personally reviewed and evaluated these images and lab results as part of my medical decision-making.   EKG Interpretation None      MDM   Final diagnoses:  COPD exacerbation (HCC)   Patient with history of COPD but no oxygen requirement at baseline. Patient has been getting treated outpatient with Levaquin and steroids but having worsening shortness of breath and wheezing. Patient does not have associated chest pain or lower extremity symptoms. Patient is hypoxic at low 90%. At this time findings most consistent with COPD exacerbation that has failed  outpatient treatment. Patient does continue to speak in full sentences but is dyspneic and has not made dramatic improvement with an hour-long nebulizer treatment. Plan will be for admission for COPD exacerbation with hypoxia    Arby BarretteMarcy Avaya Mcjunkins, MD 04/18/16 1428

## 2016-04-11 NOTE — Progress Notes (Signed)
[facility] [place] [facility]  Pt is alert and oriented x4, no complaints of pain, ambulatory, VS's WNL.  RN will continue to monitor.

## 2016-04-11 NOTE — ED Notes (Signed)
Dx with bronchitis last week-completed abx and steroid-reports no better-DOE noted

## 2016-04-11 NOTE — H&P (Signed)
History and Physical    Morgan Harper ZOX:[id number] DOB: 07/12/1956 DOA: 04/11/2016  Referring MD/NP/PA:   PCP: Danise Edge, MD   Patient coming from:  The patient is coming from home.  At baseline, pt is independent for most of ADL.        Chief Complaint: Cough and shortness of breath  HPI: Morgan Harper is a 60 y.o. female with medical history significant of tobacco abuse, COPD, hypertension, hyperlipidemia, GERD, depression, anemia, who presents with cough and shortness breath.  Patient reports that because of cough and shortness of breath, she was diagnosed with COPD exacerbation by her PCP. She completed 7 day course of Levaquin and prednisone last week. Patient states that she continues to have cough and shortness of breath, which have been progressively getting worse. She coughs up yellow colored sputum. She can speak full sentence. She does not have fever, chills, chest pain. Patient denies nausea, vomiting, abdominal pain, diarrhea, symptoms of UTI or unilateral weakness.  ED Course: pt was found to have WBC 10.9, temperature normal, no tachycardia, slightly tachypnea, oxygen desaturated to 91% on room air, electrolytes and renal function okay. Chest x-ray is negative for acute abnormalities. Patient is admitted to stepdown bed as inpt  Review of Systems:   General: no fevers, chills, no changes in body weight, has poor appetite, has fatigue HEENT: no blurry vision, hearing changes or sore throat Pulm: has dyspnea, coughing, wheezing CV: no chest pain, no palpitations Abd: no nausea, vomiting, abdominal pain, diarrhea, constipation GU: no dysuria, burning on urination, increased urinary frequency, hematuria  Ext: no leg edema Neuro: no unilateral weakness, numbness, or tingling, no vision change or hearing loss Skin: no rash MSK: No muscle spasm, no deformity, no limitation of range of movement in spin Heme: No easy bruising.  Travel history: No recent long distant  travel.  Allergy: No Known Allergies  Past Medical History  Diagnosis Date  . Hypertension   . Insomnia   . Hiatal hernia   . GERD (gastroesophageal reflux disease)   . History of chicken pox   . H/O measles   . H/O mumps   . Hyperlipidemia, mixed 09/24/2015  . Depression 09/24/2015  . Chronic bronchitis (HCC)   . Anemia 01/13/2016    Past Surgical History  Procedure Laterality Date  . Tonsillectomy      Social History:  reports that she has been smoking Cigarettes.  She has a 45 pack-year smoking history. She does not have any smokeless tobacco history on file. She reports that she does not drink alcohol or use illicit drugs.  Family History:  Family History  Problem Relation Age of Onset  . Hypertension Mother   . Allergies Mother   . Asthma Mother   . COPD Mother     previous smoker  . Heart disease Neg Hx   . Diabetes Sister   . Alzheimer's disease Maternal Grandfather      Prior to Admission medications   Medication Sig Start Date End Date Taking? Authorizing Provider  amLODipine (NORVASC) 2.5 MG tablet Take 1 tablet (2.5 mg total) by mouth daily. 01/13/16   Bradd Canary, MD  Fluticasone-Salmeterol (ADVAIR DISKUS) 250-50 MCG/DOSE AEPB Inhale 1 puff into the lungs 2 (two) times daily. 01/13/16   Bradd Canary, MD  lansoprazole (PREVACID) 30 MG capsule Take 1 capsule (30 mg total) by mouth daily at 12 noon. Patient taking differently: Take 30 mg by mouth daily at 12 noon. OTC d/t Insurance non-coverage 01/13/16  Bradd CanaryStacey A Blyth, MD  levofloxacin (LEVAQUIN) 500 MG tablet Take 1 tablet (500 mg total) by mouth daily. 04/03/16   Waldon MerlWilliam C Martin, PA-C  nicotine (NICODERM CQ - DOSED IN MG/24 HOURS) 21 mg/24hr patch Place 1 patch (21 mg total) onto the skin daily. Patient not taking: Reported on 02/20/2016 02/03/16   Waldon MerlWilliam C Martin, PA-C  predniSONE (DELTASONE) 10 MG tablet 7 tab po day 1, 6 tab po day 2, 5 tab po day 3, 4 tab po day 4, 3 tab pod day 5, 2 tab po day 6, 1 tab  po day 7. 04/03/16   Waldon MerlWilliam C Martin, PA-C  sertraline (ZOLOFT) 100 MG tablet Take 1 tablet (100 mg total) by mouth daily. 09/15/15   Bradd CanaryStacey A Blyth, MD  sertraline (ZOLOFT) 25 MG tablet Take 1 tablet (25 mg total) by mouth at bedtime. 01/13/16   Bradd CanaryStacey A Blyth, MD  varenicline (CHANTIX CONTINUING MONTH PAK) 1 MG tablet Take 1 tablet (1 mg total) by mouth 2 (two) times daily. Patient not taking: Reported on 02/20/2016 01/13/16   Bradd CanaryStacey A Blyth, MD  varenicline (CHANTIX STARTING MONTH PAK) 0.5 MG X 11 & 1 MG X 42 tablet Take one 0.5 mg tablet by mouth once daily for 3 days, then increase to one 0.5 mg tablet twice daily for 4 days, then increase to one 1 mg tablet twice daily. Patient not taking: Reported on 02/20/2016 01/13/16   Bradd CanaryStacey A Blyth, MD  VENTOLIN HFA 108 [phone](90 Base) MCG/ACT inhaler INHALE 2 PUFFS INTO THE LUNGS EVERY 6 HOURS AS NEEDED FOR WHEEZING 03/30/16   Bradd CanaryStacey A Blyth, MD    Physical Exam: Filed Vitals:   04/11/16 1930 04/11/16 2008 04/11/16 2131 04/11/16 2200  BP: 147/70  151/72 149/88  Pulse: 83  88   Temp:   98.1 F (36.7 C)   TempSrc:   Oral   Resp:   18 21  Height:   5\' 1"  (1.549 m)   Weight:   65 kg (143 lb 4.8 oz)   SpO2: 92% 93% 93%    General: Not in acute distress HEENT:       Eyes: PERRL, EOMI, no scleral icterus.       ENT: No discharge from the ears and nose, no pharynx injection, no tonsillar enlargement.        Neck: No JVD, no bruit, no mass felt. Heme: No neck lymph node enlargement. Cardiac: S1/S2, RRR, No murmurs, No gallops or rubs. Pulm: Diffuse wheezing bilaterally. No rales or rubs. Abd: Soft, nondistended, nontender, no rebound pain, no organomegaly, BS present. GU: No hematuria Ext: No pitting leg edema bilaterally. 2+DP/PT pulse bilaterally. Musculoskeletal: No joint deformities, No joint redness or warmth, no limitation of ROM in spin. Skin: No rashes.  Neuro: Alert, oriented X3, cranial nerves II-XII grossly intact, moves all extremities  normally. Psych: Patient is not psychotic, no suicidal or hemocidal ideation.  Labs on Admission: I have personally reviewed following labs and imaging studies  CBC:  Recent Labs Lab 04/11/16 1535  WBC 10.9*  NEUTROABS 6.2  HGB 11.6*  HCT 36.6  MCV 92.4  PLT 315   Basic Metabolic Panel:  Recent Labs Lab 04/11/16 1535  NA 140  K 3.5  CL 106  CO2 27  GLUCOSE 123*  BUN 15  CREATININE 0.75  CALCIUM 9.0   GFR: Estimated Creatinine Clearance: 63.8 mL/min (by C-G formula based on Cr of 0.75). Liver Function Tests: No results for input(s): AST, ALT, ALKPHOS, BILITOT,  PROT, ALBUMIN in the last 168 hours. No results for input(s): LIPASE, AMYLASE in the last 168 hours. No results for input(s): AMMONIA in the last 168 hours. Coagulation Profile: No results for input(s): INR, PROTIME in the last 168 hours. Cardiac Enzymes: No results for input(s): CKTOTAL, CKMB, CKMBINDEX, TROPONINI in the last 168 hours. BNP (last 3 results) No results for input(s): PROBNP in the last 8760 hours. HbA1C: No results for input(s): HGBA1C in the last 72 hours. CBG: No results for input(s): GLUCAP in the last 168 hours. Lipid Profile: No results for input(s): CHOL, HDL, LDLCALC, TRIG, CHOLHDL, LDLDIRECT in the last 72 hours. Thyroid Function Tests: No results for input(s): TSH, T4TOTAL, FREET4, T3FREE, THYROIDAB in the last 72 hours. Anemia Panel: No results for input(s): VITAMINB12, FOLATE, FERRITIN, TIBC, IRON, RETICCTPCT in the last 72 hours. Urine analysis: No results found for: COLORURINE, APPEARANCEUR, LABSPEC, PHURINE, GLUCOSEU, HGBUR, BILIRUBINUR, KETONESUR, PROTEINUR, UROBILINOGEN, NITRITE, LEUKOCYTESUR Sepsis Labs: (procalcitonin:4,lacticidven:4) )No results found for this or any previous visit (from the past 240 hour(s)).   Radiological Exams on Admission: Dg Chest 2 View  04/11/2016  CLINICAL DATA:  Wheezing and cough EXAM: CHEST  2 VIEW COMPARISON:  11/16/2015  FINDINGS: Cardiac shadow is within normal limits. The lungs are clear bilaterally. No focal infiltrate or sizable effusion is noted. No bony abnormality is seen. IMPRESSION: No active cardiopulmonary disease. Electronically Signed   By: Alcide Clever M.D.   On: 04/11/2016 16:07     EKG: Not done in ED, will get one.   Assessment/Plan Principal Problem:   COPD exacerbation (HCC) Active Problems:   Essential hypertension   GERD   Tobacco abuse   Depression   Anemia   Acute respiratory failure with hypoxia (HCC)  Acute respiratory failure with hypoxia due to COPD exacerbation: Patient's productive cough, shortness of breath and wheezing on auscultation are consistent with COPD exacerbation. No chest pain, less likely to have PE. No infiltration infiltration on chest x-ray.  -will admit patient to SDU -Nebulizers: scheduled Duoneb and prn albuterol -Solu-Medrol 60 mg IV tid -Oral azithromycin for 5 days.  -Mucinex for cough  -Urine S. pneumococcal antigen -Follow up blood culture x2, sputum culture, respiratory virus panel  HTN: -Continue amlodipine  GERD: -Protonix  Tobacco abuse: -Did counseling about importance of quitting smoking -Nicotine patch  Depression: Stable, no suicidal or homicidal ideations. -Continue home medications: Zoloft  Anemia: hgb stable, 11.6 on admission -f/u by CBC  DVT ppx: SQ Lovenox Code Status: Full code Family Communication: None at bed side.  Disposition Plan:  Anticipate discharge back to previous home environment Consults called:  none Admission status: SDU/inpation       Date of Service 04/11/2016    Lorretta Harp [facility] Pager [phone]  If 7PM-7AM, please contact night-coverage [email or web address] Password [facility] 04/11/2016, 10:19 PM

## 2016-04-11 NOTE — Plan of Care (Signed)
60 yo F w hx of Copd was treated at home with Levaquin, no on oxygen at baseline Here needed 1 h neb some improvement can speak in complete sentences. Still needs frequent nebs accepted to Stepdown if improves will downgrade  Navdeep Halt 6:45 PM

## 2016-04-12 DIAGNOSIS — I10 Essential (primary) hypertension: Secondary | ICD-10-CM

## 2016-04-12 DIAGNOSIS — F329 Major depressive disorder, single episode, unspecified: Secondary | ICD-10-CM

## 2016-04-12 DIAGNOSIS — Z720 Tobacco use: Secondary | ICD-10-CM

## 2016-04-12 DIAGNOSIS — J9601 Acute respiratory failure with hypoxia: Secondary | ICD-10-CM

## 2016-04-12 DIAGNOSIS — J441 Chronic obstructive pulmonary disease with (acute) exacerbation: Principal | ICD-10-CM

## 2016-04-12 LAB — HIV ANTIBODY (ROUTINE TESTING W REFLEX): HIV Screen 4th Generation wRfx: NONREACTIVE

## 2016-04-12 LAB — RESPIRATORY PANEL BY PCR
Adenovirus: NOT DETECTED
Bordetella pertussis: NOT DETECTED
CHLAMYDOPHILA PNEUMONIAE-RVPPCR: NOT DETECTED
CORONAVIRUS HKU1-RVPPCR: NOT DETECTED
CORONAVIRUS NL63-RVPPCR: NOT DETECTED
CORONAVIRUS OC43-RVPPCR: NOT DETECTED
Coronavirus 229E: NOT DETECTED
INFLUENZA A H3-RVPPCR: NOT DETECTED
INFLUENZA B-RVPPCR: NOT DETECTED
Influenza A H1 2009: NOT DETECTED
Influenza A H1: NOT DETECTED
Influenza A: NOT DETECTED
METAPNEUMOVIRUS-RVPPCR: NOT DETECTED
Mycoplasma pneumoniae: NOT DETECTED
PARAINFLUENZA VIRUS 1-RVPPCR: NOT DETECTED
PARAINFLUENZA VIRUS 2-RVPPCR: NOT DETECTED
PARAINFLUENZA VIRUS 3-RVPPCR: NOT DETECTED
PARAINFLUENZA VIRUS 4-RVPPCR: NOT DETECTED
RHINOVIRUS / ENTEROVIRUS - RVPPCR: NOT DETECTED
Respiratory Syncytial Virus: NOT DETECTED

## 2016-04-12 LAB — STREP PNEUMONIAE URINARY ANTIGEN: Strep Pneumo Urinary Antigen: NEGATIVE

## 2016-04-12 LAB — TROPONIN I: TROPONIN I: 0.03 ng/mL (ref <0.031)

## 2016-04-12 LAB — MRSA PCR SCREENING: MRSA by PCR: NEGATIVE

## 2016-04-12 MED ORDER — METHYLPREDNISOLONE SODIUM SUCC 125 MG IJ SOLR
60.0000 mg | Freq: Every day | INTRAMUSCULAR | Status: DC
  Administered 2016-04-13 – 2016-04-14 (×2): 60 mg via INTRAVENOUS
  Filled 2016-04-12 (×2): qty 2

## 2016-04-12 MED ORDER — AMLODIPINE BESYLATE 5 MG PO TABS
5.0000 mg | ORAL_TABLET | Freq: Every day | ORAL | Status: DC
Start: 2016-04-13 — End: 2016-04-13
  Administered 2016-04-13: 5 mg via ORAL
  Filled 2016-04-12: qty 1

## 2016-04-12 MED ORDER — IPRATROPIUM-ALBUTEROL 0.5-2.5 (3) MG/3ML IN SOLN
3.0000 mL | Freq: Three times a day (TID) | RESPIRATORY_TRACT | Status: DC
  Administered 2016-04-12 – 2016-04-14 (×8): 3 mL via RESPIRATORY_TRACT
  Filled 2016-04-12 (×8): qty 3

## 2016-04-12 MED ORDER — BUPROPION HCL ER (SR) 150 MG PO TB12
150.0000 mg | ORAL_TABLET | Freq: Every day | ORAL | Status: DC
  Administered 2016-04-12 – 2016-04-14 (×3): 150 mg via ORAL
  Filled 2016-04-12 (×3): qty 1

## 2016-04-12 NOTE — Progress Notes (Signed)
Pt ambulated in hallway with PT. O2 saturations stayed above 92% on room air. It dropped briefly to 88% but came right back up. No SOB noted. Will continue to monitor.

## 2016-04-12 NOTE — Progress Notes (Signed)
BRIEF NUTRITION NOTE  Pt seen for c/s for COPD protocol. Pt has not experienced any weight loss, poor appetite, N/V, or abdominal pain. Pt was eating well PTA and continues to eat well during hospital stay. Pt refuses nutritional supplements. No nutrition interventions warranted at this time. Please re-consult if nutritional issues arise.   Beryle QuantMeredith Lavern Maslow, MS [facility] Dietetic Intern Pager [phone]

## 2016-04-12 NOTE — Progress Notes (Signed)
PROGRESS NOTE    Morgan Harper  WUJ:[id number] DOB: 1956/07/13 DOA: 04/11/2016 PCP: Danise Edge, MD   Brief Narrative:  60 y.o. WF PMHx Depression, Tobacco abuse, COPD, Chronic Bronchitis, HTN, HLD, GERD,Hiatal hernia, Anemia,   who presents with cough and shortness breath.  Patient reports that because of cough and shortness of breath, she was diagnosed with COPD exacerbation by her PCP. She completed 7 day course of Levaquin and prednisone last week. Patient states that she continues to have cough and shortness of breath, which have been progressively getting worse. She coughs up yellow colored sputum. She can speak full sentence. She does not have fever, chills, chest pain. Patient denies nausea, vomiting, abdominal pain, diarrhea, symptoms of UTI or unilateral weakness.  ED Course: pt was found to have WBC 10.9, temperature normal, no tachycardia, slightly tachypnea, oxygen desaturated to 91% on room air, electrolytes and renal function okay. Chest x-ray is negative for acute abnormalities. Patient is admitted to stepdown bed as inpt   Assessment & Plan:   Principal Problem:   COPD exacerbation (HCC) Active Problems:   Essential hypertension   GERD   Tobacco abuse   Depression   Anemia   Acute respiratory failure with hypoxia (HCC)   Acute respiratory failure with hypoxia due to COPD exacerbation:  -Respiratory virus panel negative -strep pneumo urine antigen/Legionella urine antigen pending -although unlikely an acute infection after completing Lovenox levofloxacin and steroids last week will continue Oral Azithromycin for 5 days -CXR consistent with COPD, negative infiltrate. -Solu-Medrol 60 mg daily -Mucinex BID    HTN: -Increase amlodipine 5 mg daily -HEART score= 3 +; Echocardiogram pending  GERD: -Protonix  Tobacco abuse: -start Wellbutrin  150 mg daily, then increase to BID after 3 days -Nicotine patch  Depression:  -Stable, no suicidal or homicidal  ideations. -Zoloft 100 mg daily  Anemia:  -stable,  -f/u by CBC   DVT prophylaxis: Lovenox Code Status: Full Family Communication: Family present Disposition Plan: Resolution COPD exacerbation   Consultants:  NA  Procedures/Significant Events:  NA  Cultures 6/14 blood left AC/hand NGTD 6/14 respiratory virus panel negative 6/14 MRSA by PCR negative 6/14 HIV negative 6/14 strep pneumo urine antigen pending    Antimicrobials: Azithromycin 6/15>>   Devices NA   LINES / TUBES:  NA    Continuous Infusions:    Subjective: 6/15 A/O 4, states continues to smoke but interested in stopping. Positive continue  SOB, negative CP.      Objective: Filed Vitals:   04/11/16 2350 04/12/16 0100 04/12/16 0338 04/12/16 0600  BP:  129/59 127/74 119/58  Pulse: 90 97 93 86  Temp:  98.4 F (36.9 C) 98 F (36.7 C)   TempSrc:  Oral Oral   Resp: 12 23 17 19   Height:      Weight:      SpO2:  94% 92% 92%   No intake or output data in the 24 hours ending 04/12/16 0808 Filed Weights   04/11/16 1507 04/11/16 2131  Weight: 63.504 kg (140 lb) 65 kg (143 lb 4.8 oz)    Examination:  General: A/O 4,  positive acute respiratory distress Eyes: negative scleral hemorrhage, negative anisocoria, negative icterus ENT: Negative Runny nose, negative gingival bleeding, Neck:  Negative scars, masses, torticollis, lymphadenopathy, JVD Lungs: positive diffuse expiratory wheezing, negative crackles Cardiovascular: Regular rate and rhythm without murmur gallop or rub normal S1 and S2 Abdomen: negative abdominal pain, nondistended, positive soft, bowel sounds, no rebound, no ascites, no appreciable mass Extremities:  No significant cyanosis, clubbing, or edema bilateral lower extremities Skin: Negative rashes, lesions, ulcers Psychiatric:  Negative depression, negative anxiety, negative fatigue, negative mania  Central nervous system:  Cranial nerves II through XII intact, tongue/uvula  midline, all extremities muscle strength 5/5, sensation intact throughout, negative dysarthria, negative expressive aphasia, negative receptive aphasia.  .     Data Reviewed: Care during the described time interval was provided by me .  I have reviewed this patient's available data, including medical history, events of note, physical examination, and all test results as part of my evaluation. I have personally reviewed and interpreted all radiology studies.  CBC:  Recent Labs Lab 04/11/16 1535  WBC 10.9*  NEUTROABS 6.2  HGB 11.6*  HCT 36.6  MCV 92.4  PLT 315   Basic Metabolic Panel:  Recent Labs Lab 04/11/16 1535  NA 140  K 3.5  CL 106  CO2 27  GLUCOSE 123*  BUN 15  CREATININE 0.75  CALCIUM 9.0   GFR: Estimated Creatinine Clearance: 63.8 mL/min (by C-G formula based on Cr of 0.75). Liver Function Tests: No results for input(s): AST, ALT, ALKPHOS, BILITOT, PROT, ALBUMIN in the last 168 hours. No results for input(s): LIPASE, AMYLASE in the last 168 hours. No results for input(s): AMMONIA in the last 168 hours. Coagulation Profile: No results for input(s): INR, PROTIME in the last 168 hours. Cardiac Enzymes: No results for input(s): CKTOTAL, CKMB, CKMBINDEX, TROPONINI in the last 168 hours. BNP (last 3 results) No results for input(s): PROBNP in the last 8760 hours. HbA1C: No results for input(s): HGBA1C in the last 72 hours. CBG: No results for input(s): GLUCAP in the last 168 hours. Lipid Profile: No results for input(s): CHOL, HDL, LDLCALC, TRIG, CHOLHDL, LDLDIRECT in the last 72 hours. Thyroid Function Tests: No results for input(s): TSH, T4TOTAL, FREET4, T3FREE, THYROIDAB in the last 72 hours. Anemia Panel: No results for input(s): VITAMINB12, FOLATE, FERRITIN, TIBC, IRON, RETICCTPCT in the last 72 hours. Urine analysis: No results found for: COLORURINE, APPEARANCEUR, LABSPEC, PHURINE, GLUCOSEU, HGBUR, BILIRUBINUR, KETONESUR, PROTEINUR, UROBILINOGEN,  NITRITE, LEUKOCYTESUR Sepsis Labs: (procalcitonin:4,lacticidven:4)  ) Recent Results (from the past 240 hour(s))  MRSA PCR Screening     Status: None   Collection Time: 04/11/16 10:50 PM  Result Value Ref Range Status   MRSA by PCR NEGATIVE NEGATIVE Final    Comment:        The GeneXpert MRSA Assay (FDA approved for NASAL specimens only), is one component of a comprehensive MRSA colonization surveillance program. It is not intended to diagnose MRSA infection nor to guide or monitor treatment for MRSA infections.          Radiology Studies: Dg Chest 2 View  04/11/2016  CLINICAL DATA:  Wheezing and cough EXAM: CHEST  2 VIEW COMPARISON:  11/16/2015 FINDINGS: Cardiac shadow is within normal limits. The lungs are clear bilaterally. No focal infiltrate or sizable effusion is noted. No bony abnormality is seen. IMPRESSION: No active cardiopulmonary disease. Electronically Signed   By: Alcide Clever M.D.   On: 04/11/2016 16:07        Scheduled Meds: . amLODipine  2.5 mg Oral Daily  . azithromycin  500 mg Oral Daily   Followed by  . [START ON 04/13/2016] azithromycin  250 mg Oral Daily  . dextromethorphan-guaiFENesin  1 tablet Oral BID  . enoxaparin (LOVENOX) injection  40 mg Subcutaneous Q24H  . ipratropium-albuterol  3 mL Nebulization TID  . methylPREDNISolone (SOLU-MEDROL) injection  60 mg Intravenous TID  .  nicotine  21 mg Transdermal Daily  . pantoprazole  20 mg Oral Daily  . sertraline  100 mg Oral Daily   Continuous Infusions:    LOS: 1 day    Time spent: 40 minutes    Rathana Viveros, Roselind MessierURTIS J, MD [facility] Pager [phone]   If 7PM-7AM, please contact night-coverage [email or web address] Password [email or web address] 04/12/2016, 8:08 AM

## 2016-04-12 NOTE — Evaluation (Signed)
Physical Therapy Evaluation and Discharge Patient Details Name: Morgan Harper MRN: [id number] DOB: [birth year] Today's Date: 04/12/2016   History of Present Illness  Pt is a 60 y/o F who presented w/ cough and SOB and admitted for acute respiratory failure w/ hypoxia due to COPD exacerbation.  Pt's PMH includes tobacco abuse, depression, anemia, chronic bronchitis.    Clinical Impression  Pt admitted with above diagnosis. [name] present at baseline and Independent level of functioning.  No instability noted w/ high level balance activities.  SpO2 down as low as 88% when pt walking and talking w/ mask on (on droplet precautions).  Rises quickly to 90s when not talking. No skilled PT needs identified, PT is signing off.     Follow Up Recommendations No PT follow up    Equipment Recommendations  None recommended by PT    Recommendations for Other Services       Precautions / Restrictions Precautions Precautions: Other (comment) Precaution Comments: monitor O2 Restrictions Weight Bearing Restrictions: No      Mobility  Bed Mobility Overal bed mobility: Independent                Transfers Overall transfer level: Independent Equipment used: None             General transfer comment: No instability.  Independent.  Ambulation/Gait Ambulation/Gait assistance: Independent Ambulation Distance (Feet): 250 Feet Assistive device: None Gait Pattern/deviations: WFL(Within Functional Limits)   Gait velocity interpretation: at or above normal speed for age/gender General Gait Details: SpO2 down as low as 88% when pt walking and talking w/ mask on (on droplet precautions).  Rises quickly to 90s when not talking.  Stairs            Wheelchair Mobility    Modified Rankin (Stroke Patients Only)       Balance Overall balance assessment: Independent                           High level balance activites: Backward walking;Direction  changes;Turns;Sudden stops;Head turns;Other (comment) (stepping over object) High Level Balance Comments: No instability noted w/ high level balance activities Standardized Balance Assessment Standardized Balance Assessment : Dynamic Gait Index   Dynamic Gait Index Level Surface: Normal Change in Gait Speed: Normal Gait with Horizontal Head Turns: Normal Gait with Vertical Head Turns: Normal Gait and Pivot Turn: Normal Step Over Obstacle: Normal Step Around Obstacles: Normal       Pertinent Vitals/Pain Pain Assessment: No/denies pain    Home Living Family/patient expects to be discharged to:: Private residence Living Arrangements: Spouse/significant other Available Help at Discharge: Family;Available PRN/intermittently Type of Home: House Home Access: Stairs to enter Entrance Stairs-Rails: None Entrance Stairs-Number of Steps: 1 Home Layout: Two level;Bed/bath upstairs Home Equipment: None      Prior Function Level of Independence: Independent         Comments: Working full time at [employer]. Enjoys Architect.     Hand Dominance        Extremity/Trunk Assessment   Upper Extremity Assessment: Overall WFL for tasks assessed           Lower Extremity Assessment: Overall WFL for tasks assessed      Cervical / Trunk Assessment: Normal  Communication   Communication: No difficulties  Cognition Arousal/Alertness: Awake/alert Behavior During Therapy: WFL for tasks assessed/performed Overall Cognitive Status: Within Functional Limits for tasks assessed  General Comments      Exercises        Assessment/Plan    PT Assessment Patent does not need any further PT services  PT Diagnosis Difficulty walking   PT Problem List    PT Treatment Interventions     PT Goals (Current goals can be found in the Care Plan section) Acute Rehab PT Goals Patient Stated Goal: to go home  PT Goal Formulation: All assessment  and education complete, DC therapy    Frequency     Barriers to discharge        Co-evaluation               End of Session Equipment Utilized During Treatment: Gait belt Activity Tolerance: Patient tolerated treatment well Patient left: in chair;with call bell/phone within reach Nurse Communication: Mobility status;Other (comment) (SpO2)         Time: [id number] PT Time Calculation (min) (ACUTE ONLY): 17 min   Charges:   PT Evaluation $PT Eval Low Complexity: 1 Procedure     PT G Codes:       Morgan ChuAshley Harper PT, DPT  Pager: [phone] Phone: [phone] 04/12/2016, 9:00 AM

## 2016-04-13 ENCOUNTER — Inpatient Hospital Stay (HOSPITAL_COMMUNITY): Payer: 59

## 2016-04-13 DIAGNOSIS — R0600 Dyspnea, unspecified: Secondary | ICD-10-CM

## 2016-04-13 LAB — ECHOCARDIOGRAM COMPLETE
CHL CUP MV DEC (S): 204
E decel time: 204 msec
EERAT: 7.77
FS: 27 % — AB (ref 28–44)
HEIGHTINCHES: 61 in
IV/PV OW: 0.77
LA diam end sys: 36 mm
LA vol A4C: 32.3 ml
LA vol index: 24.6 mL/m2
LA vol: 41.6 mL
LADIAMINDEX: 2.13 cm/m2
LASIZE: 36 mm
LDCA: 3.14 cm2
LV E/e' medial: 7.77
LV PW d: 8.69 mm — AB (ref 0.6–1.1)
LV TDI E'LATERAL: 12.4
LVEEAVG: 7.77
LVELAT: 12.4 cm/s
LVOTD: 20 mm
MV Peak grad: 4 mmHg
MV pk A vel: 97.4 m/s
MVPKEVEL: 96.3 m/s
RV LATERAL S' VELOCITY: 14.3 cm/s
TAPSE: 20.4 mm
TDI e' medial: 9.03
WEIGHTICAEL: 2292.78 [oz_av]

## 2016-04-13 LAB — TROPONIN I

## 2016-04-13 MED ORDER — AMLODIPINE BESYLATE 10 MG PO TABS
10.0000 mg | ORAL_TABLET | Freq: Every day | ORAL | Status: DC
  Administered 2016-04-14: 10 mg via ORAL
  Filled 2016-04-13: qty 1

## 2016-04-13 MED ORDER — AMLODIPINE BESYLATE 5 MG PO TABS
5.0000 mg | ORAL_TABLET | Freq: Once | ORAL | Status: AC
Start: 2016-04-13 — End: 2016-04-13
  Administered 2016-04-13: 5 mg via ORAL
  Filled 2016-04-13: qty 1

## 2016-04-13 NOTE — Progress Notes (Signed)
PROGRESS NOTE    Morgan Harper  ZOX:[id number] DOB: [birth year] DOA: 04/11/2016 PCP: Danise Edge, MD   Brief Narrative:  60 y.o. WF PMHx Depression, Tobacco abuse, COPD, Chronic Bronchitis, HTN, HLD, GERD,Hiatal hernia, Anemia,   who presents with cough and shortness breath.  Patient reports that because of cough and shortness of breath, she was diagnosed with COPD exacerbation by her PCP. She completed 7 day course of Levaquin and prednisone last week. Patient states that she continues to have cough and shortness of breath, which have been progressively getting worse. She coughs up yellow colored sputum. She can speak full sentence. She does not have fever, chills, chest pain. Patient denies nausea, vomiting, abdominal pain, diarrhea, symptoms of UTI or unilateral weakness.  ED Course: pt was found to have WBC 10.9, temperature normal, no tachycardia, slightly tachypnea, oxygen desaturated to 91% on room air, electrolytes and renal function okay. Chest x-ray is negative for acute abnormalities. Patient is admitted to stepdown bed as inpt   Assessment & Plan:   Principal Problem:   COPD exacerbation (HCC) Active Problems:   Essential hypertension   GERD   Tobacco abuse   Depression   Anemia   Acute respiratory failure with hypoxia (HCC)   Acute respiratory failure with hypoxia due to COPD exacerbation:  -Respiratory virus panel negative -strep pneumo urine antigen/Legionella urine antigen pending -although unlikely an acute infection after completing Lovenox levofloxacin and steroids last week will continue Oral Azithromycin for 5 days -CXR consistent with COPD, negative infiltrate. -Solu-Medrol 60 mg daily -Mucinex BID -Flutter valve   -Ambulatory SPO2 pending; stands a good chance of requiring O2 on discharge  HTN: -Increase amlodipine 10 mg daily -HEART score= 3 +; Echocardiogram pending  GERD: -Protonix  Tobacco abuse: -Wellbutrin  150 mg daily, then increase to  BID after 3 days -Nicotine patch  Depression:  -Stable, no suicidal or homicidal ideations. -Zoloft 100 mg daily  Anemia:  -stable,  -f/u by CBC   DVT prophylaxis: Lovenox Code Status: Full Family Communication: None Disposition Plan: Resolution COPD exacerbation   Consultants:  NA  Procedures/Significant Events:  NA  Cultures 6/14 blood left AC/hand NGTD 6/14 respiratory virus panel negative 6/14 MRSA by PCR negative 6/14 HIV negative 6/14 strep pneumo urine antigen pending    Antimicrobials: Azithromycin 6/15>>   Devices NA   LINES / TUBES:  NA    Continuous Infusions:    Subjective: 6/15 A/O 4, significantly improved, but still positive productive cough. Negative CP, positive SOB.       Objective: Filed Vitals:   04/13/16 0828 04/13/16 1027 04/13/16 1222 04/13/16 1506  BP:  153/68 149/79   Pulse:   80   Temp:   98.3 F (36.8 C)   TempSrc:   Oral   Resp:   16   Height:      Weight:      SpO2: 98%  97% 96%    Intake/Output Summary (Last 24 hours) at 04/13/16 1627 Last data filed at 04/13/16 0200  Gross per 24 hour  Intake      0 ml  Output    200 ml  Net   -200 ml   Filed Weights   04/11/16 1507 04/11/16 2131  Weight: 63.504 kg (140 lb) 65 kg (143 lb 4.8 oz)    Examination:  General: A/O 4,  positive acute respiratory distress Eyes: negative scleral hemorrhage, negative anisocoria, negative icterus ENT: Negative Runny nose, negative gingival bleeding, Neck:  Negative scars, masses, torticollis, lymphadenopathy,  JVD Lungs: positive diffuse expiratory wheezing, Bibasilar poor air movement (distant breath sounds) with negative crackles Cardiovascular: Regular rate and rhythm without murmur gallop or rub normal S1 and S2 Abdomen: negative abdominal pain, nondistended, positive soft, bowel sounds, no rebound, no ascites, no appreciable mass Extremities: No significant cyanosis, clubbing, or edema bilateral lower extremities Skin:  Negative rashes, lesions, ulcers Psychiatric:  Negative depression, negative anxiety, negative fatigue, negative mania  Central nervous system:  Cranial nerves II through XII intact, tongue/uvula midline, all extremities muscle strength 5/5, sensation intact throughout, negative dysarthria, negative expressive aphasia, negative receptive aphasia.  .     Data Reviewed: Care during the described time interval was provided by me .  I have reviewed this patient's available data, including medical history, events of note, physical examination, and all test results as part of my evaluation. I have personally reviewed and interpreted all radiology studies.  CBC:  Recent Labs Lab 04/11/16 1535  WBC 10.9*  NEUTROABS 6.2  HGB 11.6*  HCT 36.6  MCV 92.4  PLT 315   Basic Metabolic Panel:  Recent Labs Lab 04/11/16 1535  NA 140  K 3.5  CL 106  CO2 27  GLUCOSE 123*  BUN 15  CREATININE 0.75  CALCIUM 9.0   GFR: Estimated Creatinine Clearance: 63.8 mL/min (by C-G formula based on Cr of 0.75). Liver Function Tests: No results for input(s): AST, ALT, ALKPHOS, BILITOT, PROT, ALBUMIN in the last 168 hours. No results for input(s): LIPASE, AMYLASE in the last 168 hours. No results for input(s): AMMONIA in the last 168 hours. Coagulation Profile: No results for input(s): INR, PROTIME in the last 168 hours. Cardiac Enzymes:  Recent Labs Lab 04/12/16 1534 04/12/16 2047 04/13/16 0332  TROPONINI 0.03 <0.03 <0.03   BNP (last 3 results) No results for input(s): PROBNP in the last 8760 hours. HbA1C: No results for input(s): HGBA1C in the last 72 hours. CBG: No results for input(s): GLUCAP in the last 168 hours. Lipid Profile: No results for input(s): CHOL, HDL, LDLCALC, TRIG, CHOLHDL, LDLDIRECT in the last 72 hours. Thyroid Function Tests: No results for input(s): TSH, T4TOTAL, FREET4, T3FREE, THYROIDAB in the last 72 hours. Anemia Panel: No results for input(s): VITAMINB12, FOLATE,  FERRITIN, TIBC, IRON, RETICCTPCT in the last 72 hours. Urine analysis: No results found for: COLORURINE, APPEARANCEUR, LABSPEC, PHURINE, GLUCOSEU, HGBUR, BILIRUBINUR, KETONESUR, PROTEINUR, UROBILINOGEN, NITRITE, LEUKOCYTESUR Sepsis Labs: (procalcitonin:4,lacticidven:4)  ) Recent Results (from the past 240 hour(s))  Respiratory Panel by PCR     Status: None   Collection Time: 04/11/16 10:49 PM  Result Value Ref Range Status   Adenovirus NOT DETECTED NOT DETECTED Final   Coronavirus 229E NOT DETECTED NOT DETECTED Final   Coronavirus HKU1 NOT DETECTED NOT DETECTED Final   Coronavirus NL63 NOT DETECTED NOT DETECTED Final   Coronavirus OC43 NOT DETECTED NOT DETECTED Final   Metapneumovirus NOT DETECTED NOT DETECTED Final   Rhinovirus / Enterovirus NOT DETECTED NOT DETECTED Final   Influenza A NOT DETECTED NOT DETECTED Final   Influenza A H1 NOT DETECTED NOT DETECTED Final   Influenza A H1 2009 NOT DETECTED NOT DETECTED Final   Influenza A H3 NOT DETECTED NOT DETECTED Final   Influenza B NOT DETECTED NOT DETECTED Final   Parainfluenza Virus 1 NOT DETECTED NOT DETECTED Final   Parainfluenza Virus 2 NOT DETECTED NOT DETECTED Final   Parainfluenza Virus 3 NOT DETECTED NOT DETECTED Final   Parainfluenza Virus 4 NOT DETECTED NOT DETECTED Final   Respiratory Syncytial Virus  NOT DETECTED NOT DETECTED Final   Bordetella pertussis NOT DETECTED NOT DETECTED Final   Chlamydophila pneumoniae NOT DETECTED NOT DETECTED Final   Mycoplasma pneumoniae NOT DETECTED NOT DETECTED Final  Culture, blood (routine x 2) Call MD if unable to obtain prior to antibiotics being given     Status: None (Preliminary result)   Collection Time: 04/11/16 10:50 PM  Result Value Ref Range Status   Specimen Description BLOOD LEFT ANTECUBITAL  Final   Special Requests   Final    BOTTLES DRAWN AEROBIC AND ANAEROBIC BLUE 8CC RED 10CC   Culture NO GROWTH 1 DAY  Final   Report Status PENDING  Incomplete  MRSA PCR  Screening     Status: None   Collection Time: 04/11/16 10:50 PM  Result Value Ref Range Status   MRSA by PCR NEGATIVE NEGATIVE Final    Comment:        The GeneXpert MRSA Assay (FDA approved for NASAL specimens only), is one component of a comprehensive MRSA colonization surveillance program. It is not intended to diagnose MRSA infection nor to guide or monitor treatment for MRSA infections.   Culture, blood (routine x 2) Call MD if unable to obtain prior to antibiotics being given     Status: None (Preliminary result)   Collection Time: 04/11/16 10:58 PM  Result Value Ref Range Status   Specimen Description BLOOD BLOOD LEFT HAND  Final   Special Requests   Final    BOTTLES DRAWN AEROBIC AND ANAEROBIC BLUE 10 CC RED 9CC   Culture NO GROWTH 1 DAY  Final   Report Status PENDING  Incomplete         Radiology Studies: No results found.      Scheduled Meds: . amLODipine  5 mg Oral Daily  . azithromycin  250 mg Oral Daily  . buPROPion  150 mg Oral Daily  . dextromethorphan-guaiFENesin  1 tablet Oral BID  . enoxaparin (LOVENOX) injection  40 mg Subcutaneous Q24H  . ipratropium-albuterol  3 mL Nebulization TID  . methylPREDNISolone (SOLU-MEDROL) injection  60 mg Intravenous Daily  . nicotine  21 mg Transdermal Daily  . pantoprazole  20 mg Oral Daily  . sertraline  100 mg Oral Daily   Continuous Infusions:    LOS: 2 days    Time spent: 40 minutes    WOODS, Roselind MessierURTIS J, MD [facility] Pager [phone]   If 7PM-7AM, please contact night-coverage [email or web address] Password [email or web address] 04/13/2016, 4:27 PM

## 2016-04-13 NOTE — Progress Notes (Signed)
SATURATION QUALIFICATIONS: (This note is used to comply with regulatory documentation for home oxygen)  Patient Saturations on Room Air at Rest = 93  Patient Saturations on Room Air while Ambulating = 91  Patient Saturations on 0 Liters of oxygen while Ambulating =   Please briefly explain why patient needs home oxygen: Pt does not qualify for home 02 Ambulated 500 feet

## 2016-04-13 NOTE — Progress Notes (Signed)
  Echocardiogram 2D Echocardiogram has been performed.  Delcie RochENNINGTON, Garrie Elenes 04/13/2016, 10:00 AM

## 2016-04-14 LAB — BASIC METABOLIC PANEL
ANION GAP: 6 (ref 5–15)
BUN: 20 mg/dL (ref 6–20)
CHLORIDE: 106 mmol/L (ref 101–111)
CO2: 26 mmol/L (ref 22–32)
Calcium: 9 mg/dL (ref 8.9–10.3)
Creatinine, Ser: 0.81 mg/dL (ref 0.44–1.00)
GFR calc Af Amer: >60 mL/min (ref >60)
GLUCOSE: 104 mg/dL — AB (ref 65–99)
POTASSIUM: 3.8 mmol/L (ref 3.5–5.1)
Sodium: 138 mmol/L (ref 135–145)

## 2016-04-14 LAB — CBC WITH DIFFERENTIAL/PLATELET
BASOS ABS: 0 10*3/uL (ref 0.0–0.1)
Basophils Relative: 0 %
Eosinophils Absolute: 0 10*3/uL (ref 0.0–0.7)
Eosinophils Relative: 0 %
HEMATOCRIT: 34.6 % — AB (ref 36.0–46.0)
HEMOGLOBIN: 10.7 g/dL — AB (ref 12.0–15.0)
LYMPHS ABS: 2.1 10*3/uL (ref 0.7–4.0)
LYMPHS PCT: 16 %
MCH: 27.9 pg (ref 26.0–34.0)
MCHC: 30.9 g/dL (ref 30.0–36.0)
MCV: 90.1 fL (ref 78.0–100.0)
Monocytes Absolute: 1 10*3/uL (ref 0.1–1.0)
Monocytes Relative: 8 %
NEUTROS ABS: 10.2 10*3/uL — AB (ref 1.7–7.7)
NEUTROS PCT: 76 %
PLATELETS: 297 10*3/uL (ref 150–400)
RBC: 3.84 MIL/uL — AB (ref 3.87–5.11)
RDW: 15.2 % (ref 11.5–15.5)
WBC: 13.3 10*3/uL — AB (ref 4.0–10.5)

## 2016-04-14 LAB — LEGIONELLA PNEUMOPHILA SEROGP 1 UR AG: L. pneumophila Serogp 1 Ur Ag: NEGATIVE

## 2016-04-14 LAB — MAGNESIUM: Magnesium: 2.3 mg/dL (ref 1.7–2.4)

## 2016-04-14 MED ORDER — AZITHROMYCIN 250 MG PO TABS: 250.0000 mg | ORAL_TABLET | Freq: Every day | ORAL | Status: DC

## 2016-04-14 MED ORDER — DM-GUAIFENESIN ER 30-600 MG PO TB12: 1.0000 | ORAL_TABLET | Freq: Two times a day (BID) | ORAL | Status: DC

## 2016-04-14 MED ORDER — BUPROPION HCL ER (SR) 150 MG PO TB12: 150.0000 mg | ORAL_TABLET | Freq: Two times a day (BID) | ORAL | Status: DC

## 2016-04-14 MED ORDER — FLUTICASONE-SALMETEROL 500-50 MCG/DOSE IN AEPB: 1.0000 | INHALATION_SPRAY | Freq: Two times a day (BID) | RESPIRATORY_TRACT | Status: DC

## 2016-04-14 MED ORDER — LISINOPRIL 2.5 MG PO TABS
2.5000 mg | ORAL_TABLET | Freq: Every day | ORAL | Status: DC
  Administered 2016-04-14: 2.5 mg via ORAL
  Filled 2016-04-14: qty 1

## 2016-04-14 MED ORDER — AMLODIPINE BESYLATE 10 MG PO TABS: 10.0000 mg | ORAL_TABLET | Freq: Every day | ORAL | Status: AC

## 2016-04-14 MED ORDER — AMLODIPINE BESYLATE 10 MG PO TABS: 10.0000 mg | ORAL_TABLET | Freq: Every day | ORAL | Status: DC

## 2016-04-14 MED ORDER — LISINOPRIL 2.5 MG PO TABS: 2.5000 mg | ORAL_TABLET | Freq: Every day | ORAL | Status: DC

## 2016-04-14 MED ORDER — BUPROPION HCL ER (SR) 150 MG PO TB12
150.0000 mg | ORAL_TABLET | Freq: Two times a day (BID) | ORAL | Status: DC
  Administered 2016-04-14: 150 mg via ORAL
  Filled 2016-04-14: qty 1

## 2016-04-14 NOTE — Discharge Summary (Addendum)
Physician Discharge Summary  Morgan Harper ZOX:[id number] DOB: [birth year] DOA: 04/11/2016  PCP: Danise Edge, MD  Admit date: 04/11/2016 Discharge date: 04/14/2016  Time spent: 35 minutes  Recommendations for Outpatient Follow-up: Acute respiratory failure with hypoxia due to COPD exacerbation:  -Respiratory virus panel negative -strep pneumo urine antigen/Legionella urine antigen negative -although unlikely an acute infection after completing Lovenox levofloxacin and steroids last week will continue Oral Azithromycin for 5 days -CXR consistent with COPD, negative infiltrate. -Patient to restart home Advair 500-50  -Mucinex BID -Flutter valve  -Follow-up with Dr. Reuel Derby PCP in 1-2 weeks; titrate COPD medication, HTN medication  HTN: -Increase amlodipine 10 mg daily -HEART score= 3 +; Echocardiogram; normal see results below -Lisinopril 2.5 mg daily  Tobacco abuse: -Wellbutrin 150 mg BID  -Counseled patient on sequela of continuing to smoke to include worsening COPD, and death   Depression:  -Stable, no suicidal or homicidal ideations. -Zoloft 100 mg daily  Anemia:  -stable,  -f/u by CBC  Discharge Diagnoses:  Principal Problem:   COPD exacerbation (HCC) Active Problems:   Essential hypertension   GERD   Tobacco abuse   Depression   Anemia   Acute respiratory failure with hypoxia [facility])   Discharge Condition: Stable  Diet recommendation: Regular  Filed Weights   04/11/16 1507 04/11/16 2131  Weight: 63.504 kg (140 lb) 65 kg (143 lb 4.8 oz)    History of present illness:  60 y.o. WF PMHx Depression, Tobacco abuse, COPD, Chronic Bronchitis, HTN, HLD, GERD,Hiatal hernia, Anemia,   who presents with cough and shortness breath.  Patient reports that because of cough and shortness of breath, she was diagnosed with COPD exacerbation by her PCP. She completed 7 day course of Levaquin and prednisone last week. Patient states that she continues to have  cough and shortness of breath, which have been progressively getting worse. She coughs up yellow colored sputum. She can speak full sentence. She does not have fever, chills, chest pain. Patient denies nausea, vomiting, abdominal pain, diarrhea, symptoms of UTI or unilateral weakness.  ED Course: pt was found to have WBC 10.9, temperature normal, no tachycardia, slightly tachypnea, oxygen desaturated to 91% on room air, electrolytes and renal function okay. Chest x-ray is negative for acute abnormalities. Patient is admitted to stepdown bed as inpt  During his hospitalization patient was treated for COPD exacerbation secondary to tobacco abuse. Patient was started on smoking cessation medication which she tolerated well and will be discharged on. In addition patient's HTN medication adjusted.    Procedures/Significant Events:  6/16 echocardiogram; Left ventricle: mildly dilated.-LVEF=  60% to 65%.   Cultures 6/14 blood left AC/hand NGTD 6/14 respiratory virus panel negative 6/14 MRSA by PCR negative 6/14 HIV negative 6/14 strep pneumo urine antigen pending    Antimicrobials: Azithromycin 6/15>>   Discharge Exam: Filed Vitals:   04/14/16 0800 04/14/16 1200 04/14/16 1449 04/14/16 1555  BP: 157/94 140/72  140/74  Pulse: 84 87  90  Temp: 97.9 F (36.6 C) 98.4 F (36.9 C)  98.5 F (36.9 C)  TempSrc: Oral Oral  Oral  Resp: Height:      Weight:      SpO2: 94% 94% 97% 96%    General: A/O 4, positive acute respiratory distress Eyes: negative scleral hemorrhage, negative anisocoria, negative icterus ENT: Negative Runny nose, negative gingival bleeding, Neck: Negative scars, masses, torticollis, lymphadenopathy, JVD Lungs: positive diffuse expiratory wheezing, Bibasilar poor air movement (distant breath sounds) with negative crackles.  SSx improving Cardiovascular: Regular rate and rhythm without murmur gallop or rub normal S1 and S2  Discharge Instructions      Medication List    STOP taking these medications        Fluticasone-Salmeterol 250-50 MCG/DOSE Aepb  Commonly known as:  ADVAIR DISKUS  Replaced by:  Fluticasone-Salmeterol 500-50 MCG/DOSE Aepb     lansoprazole 30 MG capsule  Commonly known as:  PREVACID     levofloxacin 500 MG tablet  Commonly known as:  LEVAQUIN     predniSONE 10 MG tablet  Commonly known as:  DELTASONE     VENTOLIN HFA 108 (90 Base) MCG/ACT inhaler  Generic drug:  albuterol      TAKE these medications        amLODipine 10 MG tablet  Commonly known as:  NORVASC  Take 1 tablet (10 mg total) by mouth daily.     azithromycin 250 MG tablet  Commonly known as:  ZITHROMAX  Take 1 tablet (250 mg total) by mouth daily.     buPROPion 150 MG 12 hr tablet  Commonly known as:  WELLBUTRIN SR  Take 1 tablet (150 mg total) by mouth 2 (two) times daily.     dextromethorphan-guaiFENesin 30-600 MG 12hr tablet  Commonly known as:  MUCINEX DM  Take 1 tablet by mouth 2 (two) times daily.     Fluticasone-Salmeterol 500-50 MCG/DOSE Aepb  Commonly known as:  ADVAIR DISKUS  Inhale 1 puff into the lungs 2 (two) times daily.     lisinopril 2.5 MG tablet  Commonly known as:  PRINIVIL,ZESTRIL  Take 1 tablet (2.5 mg total) by mouth daily.     sertraline 100 MG tablet  Commonly known as:  ZOLOFT  Take 1 tablet (100 mg total) by mouth daily.       No Known Allergies Follow-up Information    Follow up with [name], [name], MD In 1 week.   Specialty:  Family Medicine   Why:  Follow-up COPD exacerbation, titrate hypertension medication, titrate tobacco cessation medication   Contact information:   [address] [place] [id number] [phone]        The results of significant diagnostics from this hospitalization (including imaging, microbiology, ancillary and laboratory) are listed below for reference.    Significant Diagnostic Studies: Dg Chest 2 View  04/11/2016  CLINICAL DATA:  Wheezing and  cough EXAM: CHEST  2 VIEW COMPARISON:  11/16/2015 FINDINGS: Cardiac shadow is within normal limits. The lungs are clear bilaterally. No focal infiltrate or sizable effusion is noted. No bony abnormality is seen. IMPRESSION: No active cardiopulmonary disease. Electronically Signed   By: Alcide CleverMark  Lukens M.D.   On: 04/11/2016 16:07    Microbiology: Recent Results (from the past 240 hour(s))  Respiratory Panel by PCR     Status: None   Collection Time: 04/11/16 10:49 PM  Result Value Ref Range Status   Adenovirus NOT DETECTED NOT DETECTED Final   Coronavirus 229E NOT DETECTED NOT DETECTED Final   Coronavirus HKU1 NOT DETECTED NOT DETECTED Final   Coronavirus NL63 NOT DETECTED NOT DETECTED Final   Coronavirus OC43 NOT DETECTED NOT DETECTED Final   Metapneumovirus NOT DETECTED NOT DETECTED Final   Rhinovirus / Enterovirus NOT DETECTED NOT DETECTED Final   Influenza A NOT DETECTED NOT DETECTED Final   Influenza A H1 NOT DETECTED NOT DETECTED Final   Influenza A H1 2009 NOT DETECTED NOT DETECTED Final   Influenza A H3 NOT DETECTED NOT DETECTED  Final   Influenza B NOT DETECTED NOT DETECTED Final   Parainfluenza Virus 1 NOT DETECTED NOT DETECTED Final   Parainfluenza Virus 2 NOT DETECTED NOT DETECTED Final   Parainfluenza Virus 3 NOT DETECTED NOT DETECTED Final   Parainfluenza Virus 4 NOT DETECTED NOT DETECTED Final   Respiratory Syncytial Virus NOT DETECTED NOT DETECTED Final   Bordetella pertussis NOT DETECTED NOT DETECTED Final   Chlamydophila pneumoniae NOT DETECTED NOT DETECTED Final   Mycoplasma pneumoniae NOT DETECTED NOT DETECTED Final  Culture, blood (routine x 2) Call MD if unable to obtain prior to antibiotics being given     Status: None (Preliminary result)   Collection Time: 04/11/16 10:50 PM  Result Value Ref Range Status   Specimen Description BLOOD LEFT ANTECUBITAL  Final   Special Requests   Final    BOTTLES DRAWN AEROBIC AND ANAEROBIC BLUE 8CC RED 10CC   Culture NO GROWTH 2  DAYS  Final   Report Status PENDING  Incomplete  MRSA PCR Screening     Status: None   Collection Time: 04/11/16 10:50 PM  Result Value Ref Range Status   MRSA by PCR NEGATIVE NEGATIVE Final    Comment:        The GeneXpert MRSA Assay (FDA approved for NASAL specimens only), is one component of a comprehensive MRSA colonization surveillance program. It is not intended to diagnose MRSA infection nor to guide or monitor treatment for MRSA infections.   Culture, blood (routine x 2) Call MD if unable to obtain prior to antibiotics being given     Status: None (Preliminary result)   Collection Time: 04/11/16 10:58 PM  Result Value Ref Range Status   Specimen Description BLOOD BLOOD LEFT HAND  Final   Special Requests   Final    BOTTLES DRAWN AEROBIC AND ANAEROBIC BLUE 10 CC RED 9CC   Culture NO GROWTH 2 DAYS  Final   Report Status PENDING  Incomplete     Labs: Basic Metabolic Panel:  Recent Labs Lab 04/11/16 1535 04/14/16 0245  NA 140 138  K 3.5 3.8  CL 106 106  CO2 27 26  GLUCOSE 123* 104*  BUN 15 20  CREATININE 0.75 0.81  CALCIUM 9.0 9.0  MG  --  2.3   Liver Function Tests: No results for input(s): AST, ALT, ALKPHOS, BILITOT, PROT, ALBUMIN in the last 168 hours. No results for input(s): LIPASE, AMYLASE in the last 168 hours. No results for input(s): AMMONIA in the last 168 hours. CBC:  Recent Labs Lab 04/11/16 1535 04/14/16 0245  WBC 10.9* 13.3*  NEUTROABS 6.2 10.2*  HGB 11.6* 10.7*  HCT 36.6 34.6*  MCV 92.4 90.1  PLT 315 297   Cardiac Enzymes:  Recent Labs Lab 04/12/16 1534 04/12/16 2047 04/13/16 0332  TROPONINI 0.03 <0.03 <0.03   BNP: BNP (last 3 results) No results for input(s): BNP in the last 8760 hours.  ProBNP (last 3 results) No results for input(s): PROBNP in the last 8760 hours.  CBG: No results for input(s): GLUCAP in the last 168 hours.     Signed:  Carolyne Littles, MD [facility] [phone] pager

## 2016-04-14 NOTE — Progress Notes (Signed)
Pt discharged per w/c with all personal belongings by Nurse  Home via personal vehicle accompanied by husband Pt has cell phone & charger &  hard copy of prescriptions They were called into the pharmacy but her store is closed on Sundays Dr Joseph ArtWoods aware & fixed it.

## 2016-04-14 NOTE — Progress Notes (Signed)
Went over all discharge info with patient. Including meds and follow up appointments Pt encouraged heavily not to smoke

## 2016-04-17 ENCOUNTER — Ambulatory Visit: Payer: 59 | Admitting: Family Medicine

## 2016-04-17 ENCOUNTER — Ambulatory Visit (INDEPENDENT_AMBULATORY_CARE_PROVIDER_SITE_OTHER): Payer: 59 | Admitting: Family Medicine

## 2016-04-17 ENCOUNTER — Encounter: Payer: Self-pay | Admitting: Family Medicine

## 2016-04-17 VITALS — BP 122/72 | HR 89 | Temp 98.1°F | Ht 61.0 in | Wt 141.1 lb

## 2016-04-17 DIAGNOSIS — I10 Essential (primary) hypertension: Secondary | ICD-10-CM | POA: Diagnosis not present

## 2016-04-17 DIAGNOSIS — D649 Anemia, unspecified: Secondary | ICD-10-CM

## 2016-04-17 DIAGNOSIS — E638 Other specified nutritional deficiencies: Secondary | ICD-10-CM

## 2016-04-17 DIAGNOSIS — Z720 Tobacco use: Secondary | ICD-10-CM | POA: Diagnosis not present

## 2016-04-17 DIAGNOSIS — J9601 Acute respiratory failure with hypoxia: Secondary | ICD-10-CM

## 2016-04-17 DIAGNOSIS — J441 Chronic obstructive pulmonary disease with (acute) exacerbation: Secondary | ICD-10-CM

## 2016-04-17 DIAGNOSIS — J209 Acute bronchitis, unspecified: Secondary | ICD-10-CM

## 2016-04-17 DIAGNOSIS — R197 Diarrhea, unspecified: Secondary | ICD-10-CM

## 2016-04-17 LAB — CULTURE, BLOOD (ROUTINE X 2)
CULTURE: NO GROWTH
Culture: NO GROWTH

## 2016-04-17 NOTE — Assessment & Plan Note (Signed)
Well controlled, no changes to meds. Encouraged heart healthy diet such as the DASH diet and exercise as tolerated.  °

## 2016-04-17 NOTE — Patient Instructions (Signed)

## 2016-04-17 NOTE — Progress Notes (Signed)
Pre visit review using our clinic review tool, if applicable. No additional management support is needed unless otherwise documented below in the visit note. 

## 2016-04-17 NOTE — Assessment & Plan Note (Signed)
Doing better s/p hospitalization, continue efforts

## 2016-04-17 NOTE — Assessment & Plan Note (Signed)
Has not smoked in 8 days. Has quit once for 16 months. Encouraged to continue. Has felt jittery the last few days she wonders if it is the Wllbutrin so she is holding off today. Consider nicotine patches if does tolerate Wellbutrin

## 2016-04-22 IMAGING — CR DG CHEST 2V
2 series · 2 of 2 positions shown · non-contrast
Comparison: 03/17/2013

CLINICAL DATA: Asthmatic bronchitis, COPD exacerbation

EXAM:
CHEST  2 VIEW

[view not recorded (1 of 2)]
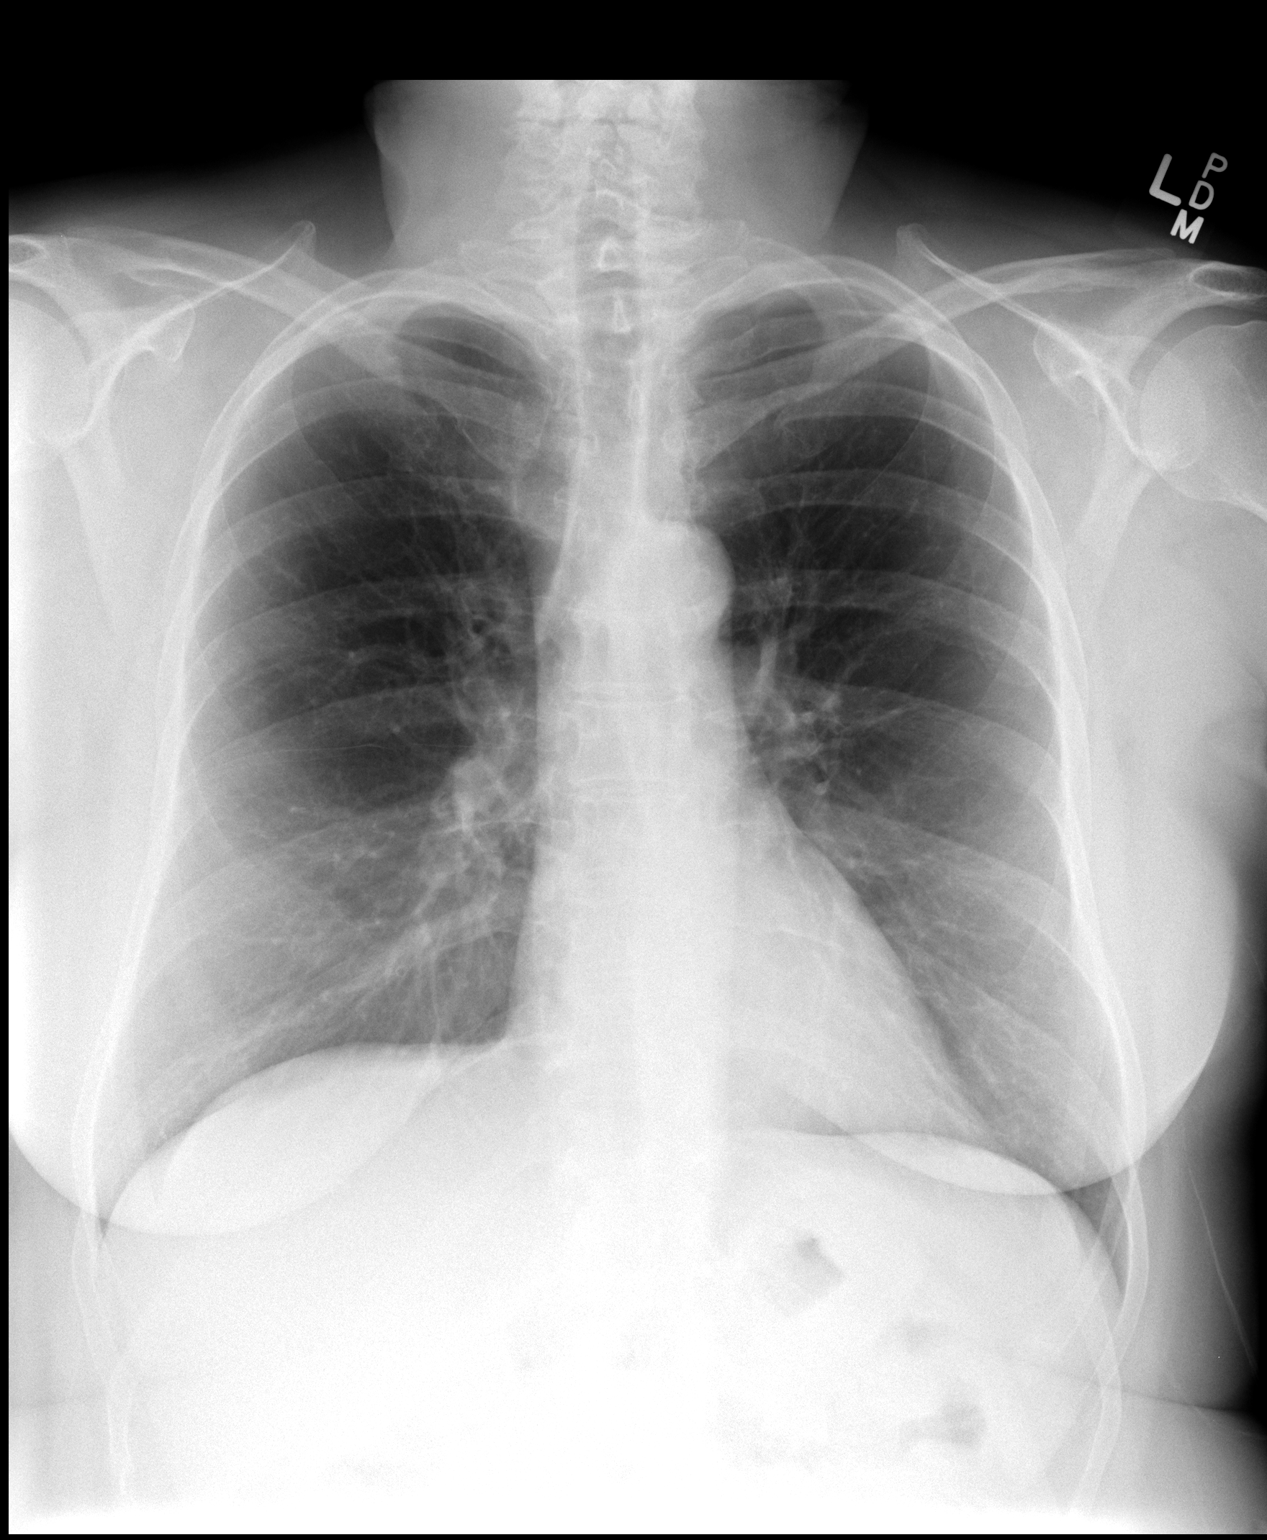

[view not recorded (2 of 2)]
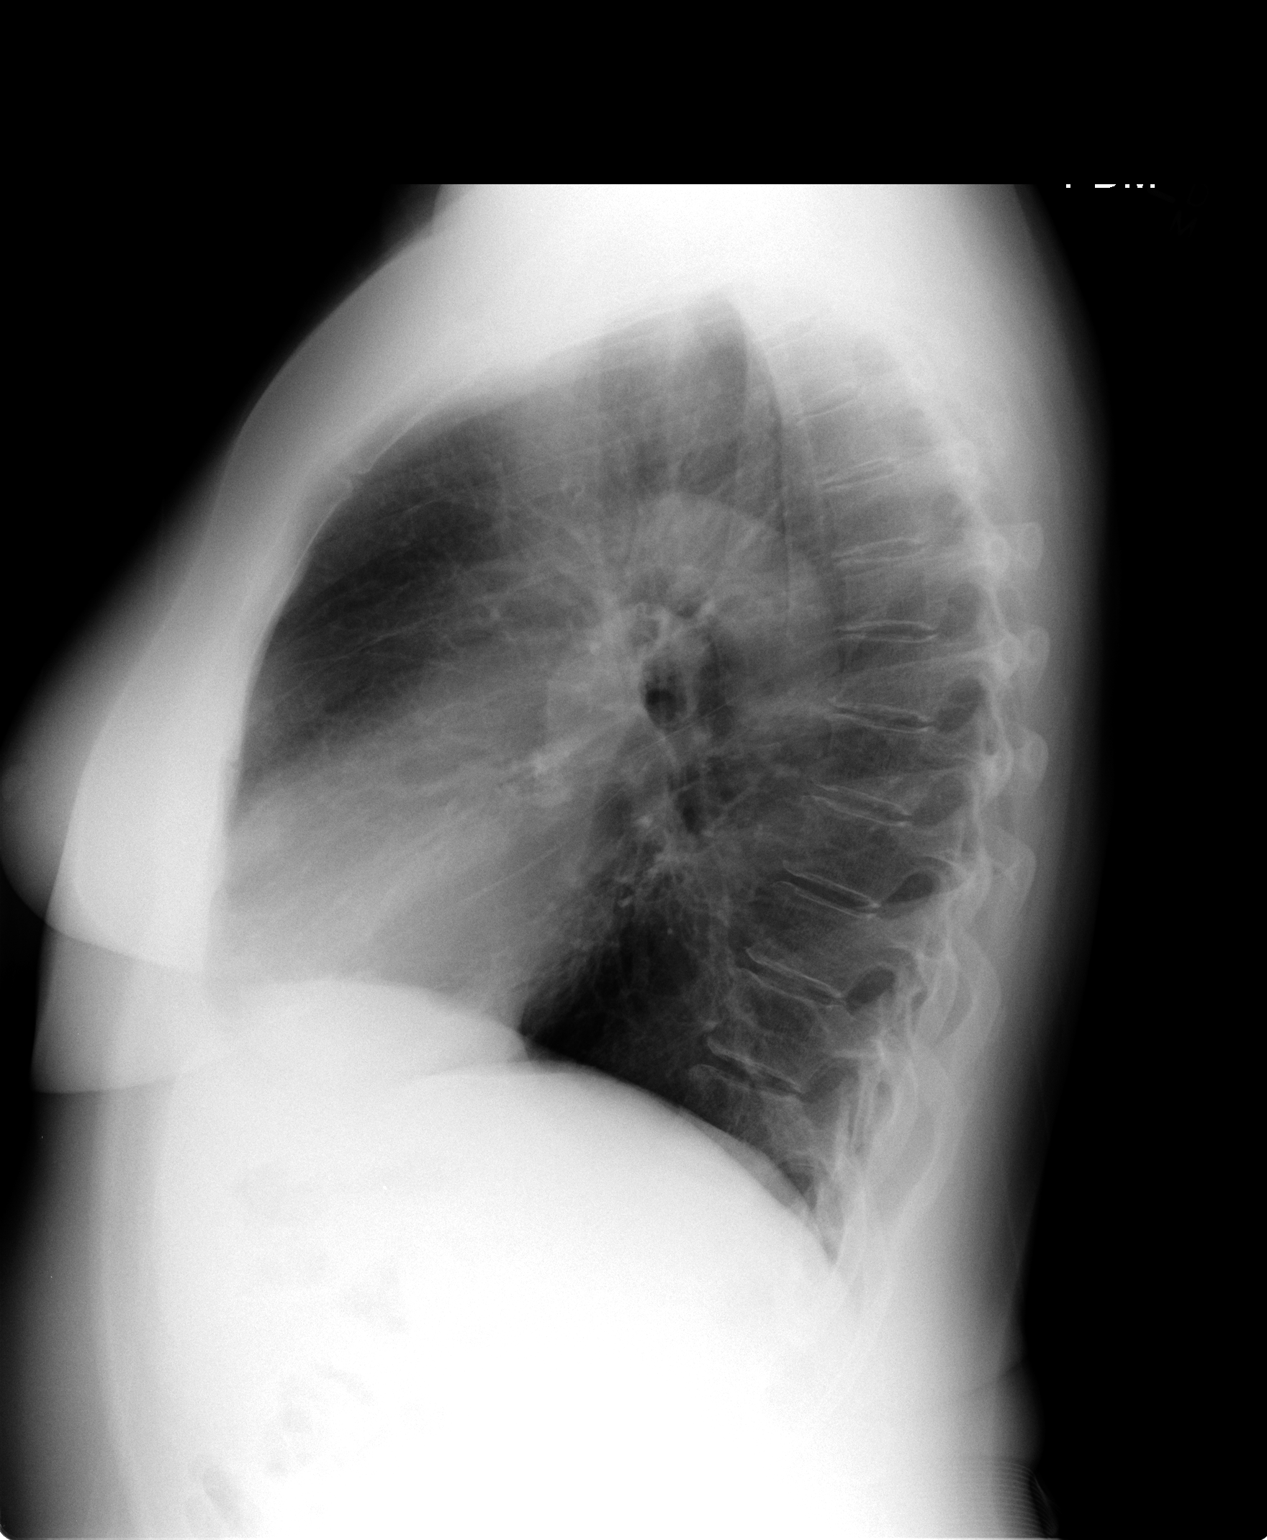

[2 of 2 positions shown; findings below may reference images not displayed]

FINDINGS: Cardiomediastinal silhouette is stable. Hyperinflation again noted.
No acute infiltrate or pleural effusion. No pulmonary edema. Minimal
degenerative changes mid thoracic spine.
IMPRESSION: No active cardiopulmonary disease.

## 2016-04-29 ENCOUNTER — Encounter: Payer: Self-pay | Admitting: Family Medicine

## 2016-04-29 DIAGNOSIS — R197 Diarrhea, unspecified: Secondary | ICD-10-CM

## 2016-04-29 HISTORY — DX: Diarrhea, unspecified: R19.7

## 2016-04-29 NOTE — Assessment & Plan Note (Signed)
Had some atypical chest pain with flare. Now resolved. No new cardiac concerns were identified

## 2016-04-29 NOTE — Assessment & Plan Note (Signed)
Doing much better s/p hospitalization. Encouraged increased rest and hydration, add probiotics, zinc such as Coldeze or Xicam. Treat fevers as needed if symptoms worsen and let us know

## 2016-04-29 NOTE — Progress Notes (Signed)
Patient ID: Morgan Harper, female   DOB: 04-11-1956, 60 y.o.   MRN: [id number]   Subjective:    Patient ID: Morgan RampBonnie Harper, female    DOB: 04-11-1956, 60 y.o.   MRN: [id number]  Chief Complaint  Patient presents with  . Hospitalization Follow-up    bronchitis    HPI Patient is in today for follow up after a hospitalization. She is feeling better after being hospitalized for a COPD exacerbation. Her sob, chest pain and respiratory symptoms are improved since treatment but she is struggling with numerous loose stool since then. Denies palp/HA/congestion/fevers or GU c/o. Taking meds as prescribed. No bloody or tarry stool.   Past Medical History  Diagnosis Date  . Hypertension   . Insomnia   . Hiatal hernia   . GERD (gastroesophageal reflux disease)   . History of chicken pox   . H/O measles   . H/O mumps   . Hyperlipidemia, mixed 09/24/2015  . Depression 09/24/2015  . Chronic bronchitis (HCC)   . Anemia 01/13/2016  . Diarrhea 04/29/2016    Past Surgical History  Procedure Laterality Date  . Tonsillectomy      Family History  Problem Relation Age of Onset  . Hypertension Mother   . Allergies Mother   . Asthma Mother   . COPD Mother     previous smoker  . Heart disease Neg Hx   . Diabetes Sister   . Alzheimer's disease Maternal Grandfather     Social History   Social History  . Marital Status: Married    Spouse Name: N/A  . Number of Children: N/A  . Years of Education: N/A   Occupational History  . Not on file.   Social History Main Topics  . Smoking status: Current Some Day Smoker -- 1.00 packs/day for 45 years    Types: Cigarettes  . Smokeless tobacco: Not on file  . Alcohol Use: No  . Drug Use: No  . 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 Not on file   Social History Narrative    Outpatient Prescriptions Prior to Visit  Medication Sig  Dispense Refill  . amLODipine (NORVASC) 10 MG tablet Take 1 tablet (10 mg total) by mouth daily. 30 tablet 0  . buPROPion (WELLBUTRIN SR) 150 MG 12 hr tablet Take 1 tablet (150 mg total) by mouth 2 (two) times daily. 60 tablet 0  . Fluticasone-Salmeterol (ADVAIR DISKUS) 500-50 MCG/DOSE AEPB Inhale 1 puff into the lungs 2 (two) times daily. 60 each 0  . lisinopril (PRINIVIL,ZESTRIL) 2.5 MG tablet Take 1 tablet (2.5 mg total) by mouth daily. 30 tablet 0  . sertraline (ZOLOFT) 100 MG tablet Take 1 tablet (100 mg total) by mouth daily. 30 tablet 5  . azithromycin (ZITHROMAX) 250 MG tablet Take 1 tablet (250 mg total) by mouth daily. 3 tablet 0  . dextromethorphan-guaiFENesin (MUCINEX DM) 30-600 MG 12hr tablet Take 1 tablet by mouth 2 (two) times daily. 30 tablet 0   No facility-administered medications prior to visit.    No Known Allergies  Review of Systems  Constitutional: Negative for fever, chills and malaise/fatigue.  HENT: Negative for congestion and hearing loss.   Eyes: Negative for discharge.  Respiratory: Positive for shortness of breath. Negative for cough and sputum production.   Cardiovascular: Negative for chest pain, palpitations and leg swelling.  Gastrointestinal: Positive for diarrhea. Negative for  heartburn, nausea, vomiting, abdominal pain, constipation and blood in stool.  Genitourinary: Negative for dysuria, urgency, frequency and hematuria.  Musculoskeletal: Negative for myalgias, back pain and falls.  Skin: Negative for rash.  Neurological: Negative for dizziness, sensory change, loss of consciousness, weakness and headaches.  Endo/Heme/Allergies: Negative for environmental allergies. Does not bruise/bleed easily.  Psychiatric/Behavioral: Negative for depression and suicidal ideas. The patient is not nervous/anxious and does not have insomnia.        Objective:    Physical Exam  Constitutional: She is oriented to person, place, and time. She appears well-developed  and well-nourished. No distress.  HENT:  Head: Normocephalic and atraumatic.  Nose: Nose normal.  Eyes: Right eye exhibits no discharge. Left eye exhibits no discharge.  Neck: Normal range of motion. Neck supple.  Cardiovascular: Normal rate and regular rhythm.   No murmur heard. Pulmonary/Chest: Effort normal and breath sounds normal.  Abdominal: Soft. Bowel sounds are normal. There is no tenderness.  Musculoskeletal: She exhibits no edema.  Neurological: She is alert and oriented to person, place, and time.  Skin: Skin is warm and dry.  Psychiatric: She has a normal mood and affect.  Nursing note and vitals reviewed.   BP 122/72 mmHg  Pulse 89  Temp(Src) 98.1 F (36.7 C) (Oral)  Ht  (1.549 m)  Wt 141 lb 2 oz (64.014 kg)  BMI 26.68 kg/m2  SpO2 96% Wt Readings from Last 3 Encounters:  04/17/16 141 lb 2 oz (64.014 kg)  04/11/16 143 lb 4.8 oz (65 kg)  04/03/16 142 lb 2 oz (64.467 kg)     Lab Results  Component Value Date   WBC 13.3* 04/14/2016   HGB 10.7* 04/14/2016   HCT 34.6* 04/14/2016   PLT 297 04/14/2016   GLUCOSE 104* 04/14/2016   CHOL 215* 01/13/2016   TRIG 87.0 01/13/2016   HDL 62.20 01/13/2016   LDLCALC 136* 01/13/2016   ALT 11 01/13/2016   AST 10 01/13/2016   NA 138 04/14/2016   K 3.8 04/14/2016   CL 106 04/14/2016   CREATININE 0.81 04/14/2016   BUN 20 04/14/2016   CO2 26 04/14/2016   TSH 1.09 01/13/2016    Lab Results  Component Value Date   TSH 1.09 01/13/2016   Lab Results  Component Value Date   WBC 13.3* 04/14/2016   HGB 10.7* 04/14/2016   HCT 34.6* 04/14/2016   MCV 90.1 04/14/2016   PLT 297 04/14/2016   Lab Results  Component Value Date   NA 138 04/14/2016   K 3.8 04/14/2016   CO2 26 04/14/2016   GLUCOSE 104* 04/14/2016   BUN 20 04/14/2016   CREATININE 0.81 04/14/2016   BILITOT 0.3 01/13/2016   ALKPHOS 68 01/13/2016   AST 10 01/13/2016   ALT 11 01/13/2016   PROT 6.9 01/13/2016   ALBUMIN 4.2 01/13/2016   CALCIUM 9.0  04/14/2016   ANIONGAP 6 04/14/2016   GFR 83.45 01/13/2016   Lab Results  Component Value Date   CHOL 215* 01/13/2016   Lab Results  Component Value Date   HDL 62.20 01/13/2016   Lab Results  Component Value Date   LDLCALC 136* 01/13/2016   Lab Results  Component Value Date   TRIG 87.0 01/13/2016   Lab Results  Component Value Date   CHOLHDL 3 01/13/2016   No results found for: HGBA1C     Assessment & Plan:   Problem List Items Addressed This Visit    Essential hypertension    Well controlled,  no changes to meds. Encouraged heart healthy diet such as the DASH diet and exercise as tolerated.       Tobacco abuse - Primary    Has not smoked in 8 days. Has quit once for 16 months. Encouraged to continue. Has felt jittery the last few days she wonders if it is the Wllbutrin so she is holding off today. Consider nicotine patches if does tolerate Wellbutrin      Acute bronchitis    Doing much better s/p hospitalization. Encouraged increased rest and hydration, add probiotics, zinc such as Coldeze or Xicam. Treat fevers as needed if symptoms worsen and let us know      COPD exacerbation (HCC)    Had some atypical chest pain with flare. Now resolved. No new cardiac concerns were identified      Anemia   Relevant Orders   CBC w/Diff   Acute respiratory failure with hypoxia [facility](HCC)    Doing better s/p hospitalization, continue efforts      Diarrhea    Noted since hospitalization. Check cbc and cdiff and add a probiotic. Report worsening symptoms      Relevant Orders   Stool C-Diff Toxin Assay    Other Visit Diagnoses    Fiber deficiency           I have discontinued Ms. Gohman's azithromycin and dextromethorphan-guaiFENesin. I am also having her maintain her sertraline, amLODipine, buPROPion, Fluticasone-Salmeterol, and lisinopril.  No orders of the defined types were placed in this encounter.     [name], [name], MD

## 2016-04-29 NOTE — Assessment & Plan Note (Signed)
Noted since hospitalization. Check cbc and cdiff and add a probiotic. Report worsening symptoms

## 2016-05-09 ENCOUNTER — Institutional Professional Consult (permissible substitution): Payer: 59 | Admitting: Pulmonary Disease

## 2016-06-14 ENCOUNTER — Other Ambulatory Visit: Payer: Self-pay | Admitting: Family Medicine

## 2016-06-14 MED ORDER — LISINOPRIL 2.5 MG PO TABS: 2.5000 mg | ORAL_TABLET | Freq: Every day | ORAL | 6 refills | Status: DC

## 2016-06-14 NOTE — Addendum Note (Signed)
Addended by: Scharlene GlossEWING, Jodeci Roarty B on: 06/14/2016 10:19 AM   Modules accepted: Orders

## 2016-06-15 ENCOUNTER — Telehealth: Payer: Self-pay | Admitting: Family Medicine

## 2016-06-15 MED ORDER — SERTRALINE HCL 100 MG PO TABS: 100.0000 mg | ORAL_TABLET | Freq: Every day | ORAL | 1 refills | Status: DC

## 2016-06-15 NOTE — Telephone Encounter (Signed)
Relation to ZO:XWRUpt:self Call back [phone] [facility] [place] - [id number] [phone] (Phone) [phone] (Fax)     Reason for call:  Patient requesting a refill sertraline (ZOLOFT) 100 MG tablet

## 2016-06-15 NOTE — Telephone Encounter (Signed)
[phone] and patient informed

## 2016-06-27 ENCOUNTER — Ambulatory Visit (INDEPENDENT_AMBULATORY_CARE_PROVIDER_SITE_OTHER): Payer: BLUE CROSS/BLUE SHIELD | Admitting: Physician Assistant

## 2016-06-27 ENCOUNTER — Encounter: Payer: Self-pay | Admitting: Physician Assistant

## 2016-06-27 VITALS — BP 130/70 | HR 81 | Temp 98.1°F | Wt 144.0 lb

## 2016-06-27 DIAGNOSIS — J209 Acute bronchitis, unspecified: Secondary | ICD-10-CM

## 2016-06-27 DIAGNOSIS — J440 Chronic obstructive pulmonary disease with acute lower respiratory infection: Secondary | ICD-10-CM | POA: Diagnosis not present

## 2016-06-27 DIAGNOSIS — R062 Wheezing: Secondary | ICD-10-CM | POA: Diagnosis not present

## 2016-06-27 MED ORDER — DOXYCYCLINE HYCLATE 100 MG PO CAPS: 100.0000 mg | ORAL_CAPSULE | Freq: Two times a day (BID) | ORAL | 0 refills | Status: DC

## 2016-06-27 MED ORDER — IPRATROPIUM-ALBUTEROL 0.5-2.5 (3) MG/3ML IN SOLN
3.0000 mL | Freq: Four times a day (QID) | RESPIRATORY_TRACT | Status: DC
  Administered 2016-06-27: 3 mL via RESPIRATORY_TRACT

## 2016-06-27 MED ORDER — PREDNISONE 20 MG PO TABS: 40.0000 mg | ORAL_TABLET | Freq: Every day | ORAL | 0 refills | Status: DC

## 2016-06-27 NOTE — Patient Instructions (Signed)
Take antibiotic (Doxycycline) as directed.  Increase fluids.  Get plenty of rest. Use Mucinex for congestion. Restart your albuterol and take the Advair as directed. Take a daily probiotic (I recommend Align or Culturelle, but even Activia Yogurt may be beneficial).  A humidifier placed in the bedroom may offer some relief for a dry, scratchy throat of nasal irritation.  Read information below on acute bronchitis.  If wheezing worsens at all, fill the script for steroid and take as directed.    Please call or return to clinic if symptoms are not improving.  Acute Bronchitis Bronchitis is when the airways that extend from the windpipe into the lungs get red, puffy, and painful (inflamed). Bronchitis often causes thick spit (mucus) to develop. This leads to a cough. A cough is the most common symptom of bronchitis. In acute bronchitis, the condition usually begins suddenly and goes away over time (usually in 2 weeks). Smoking, allergies, and asthma can make bronchitis worse. Repeated episodes of bronchitis may cause more lung problems.  HOME CARE  Rest.  Drink enough fluids to keep your pee (urine) clear or pale yellow (unless you need to limit fluids as told by your doctor).  Only take over-the-counter or prescription medicines as told by your doctor.  Avoid smoking and secondhand smoke. These can make bronchitis worse. If you are a smoker, think about using nicotine gum or skin patches. Quitting smoking will help your lungs heal faster.  Reduce the chance of getting bronchitis again by:  Washing your hands often.  Avoiding people with cold symptoms.  Trying not to touch your hands to your mouth, nose, or eyes.  Follow up with your doctor as told.  GET HELP IF: Your symptoms do not improve after 1 week of treatment. Symptoms include:  Cough.  Fever.  Coughing up thick spit.  Body aches.  Chest congestion.  Chills.  Shortness of breath.  Sore throat.  GET HELP RIGHT  AWAY IF:   You have an increased fever.  You have chills.  You have severe shortness of breath.  You have bloody thick spit (sputum).  You throw up (vomit) often.  You lose too much body fluid (dehydration).  You have a severe headache.  You faint.  MAKE SURE YOU:   Understand these instructions.  Will watch your condition.  Will get help right away if you are not doing well or get worse. Document Released: 04/02/2008 Document Revised: 06/17/2013 Document Reviewed: 04/07/2013 [facility] Patient Information 2015 [employer] [place]. This information is not intended to replace advice given to you by your health care provider. Make sure you discuss any questions you have with your health care provider.

## 2016-06-27 NOTE — Progress Notes (Signed)
Patient presents to clinic today c/o 10 days of chest congestion with cough productive of thick yellow-green sputum. Patient with history of COPD, currently on Advair. Endorses 2 days of wheezing and chest tightness. Denies fever, chills or shortness of breath. Denies recent travel. Thankfully has stopped smoking since last visit.   Past Medical History:  Diagnosis Date  . Anemia 01/13/2016  . Chronic bronchitis ([place])   . Depression 09/24/2015  . Diarrhea 04/29/2016  . GERD (gastroesophageal reflux disease)   . H/O measles   . H/O mumps   . Hiatal hernia   . History of chicken pox   . Hyperlipidemia, mixed 09/24/2015  . Hypertension   . Insomnia     Current Outpatient Prescriptions on File Prior to Visit  Medication Sig Dispense Refill  . amLODipine (NORVASC) 10 MG tablet Take 1 tablet (10 mg total) by mouth daily. 30 tablet 0  . Fluticasone-Salmeterol (ADVAIR DISKUS) 500-50 MCG/DOSE AEPB Inhale 1 puff into the lungs 2 (two) times daily. 60 each 0  . sertraline (ZOLOFT) 100 MG tablet Take 1 tablet (100 mg total) by mouth daily. 90 tablet 1   No current facility-administered medications on file prior to visit.     No Known Allergies  Family History  Problem Relation Age of Onset  . Hypertension Mother   . Allergies Mother   . Asthma Mother   . COPD Mother     previous smoker  . Heart disease Neg Hx   . Diabetes Sister   . Alzheimer's disease Maternal Grandfather     Social History   Social History  . Marital status: Married    Spouse name: N/A  . Number of children: N/A  . Years of education: N/A   Social History Main Topics  . Smoking status: Current Some Day Smoker    Packs/day: 1.00    Years: 45.00    Types: Cigarettes  . Smokeless tobacco: Not on file  . Alcohol use No  . Drug use: No  . 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 Not on file    Social History Narrative  . No narrative on file    Review of Systems - See HPI.  All other ROS are negative.  BP 130/70   Pulse 81   Temp 98.1 F (36.7 C)   Wt 144 lb (65.3 kg)   SpO2 98%   BMI 27.21 kg/m   Physical Exam  Constitutional: She is oriented to person, place, and time and well-developed, well-nourished, and in no distress.  HENT:  Head: Normocephalic and atraumatic.  Right Ear: External ear normal.  Left Ear: External ear normal.  Nose: Nose normal.  Mouth/Throat: Oropharynx is clear and moist. No oropharyngeal exudate.  TM within normal limits bilaterally.  Eyes: Conjunctivae are normal.  Neck: Neck supple.  Cardiovascular: Normal rate, regular rhythm, normal heart sounds and intact distal pulses.   Pulmonary/Chest: Effort normal. No respiratory distress. She has wheezes. She has no rales. She exhibits no tenderness.  Neurological: She is alert and oriented to person, place, and time.  Skin: Skin is warm and dry. No rash noted.  Psychiatric: Affect normal.  Vitals reviewed.   Recent Results (from the past 2160 hour(s))  Basic metabolic panel     Status: Abnormal   Collection Time: 04/11/16  3:35 PM  Result Value Ref Range  Sodium 140 135 - 145 mmol/L   Potassium 3.5 3.5 - 5.1 mmol/L   Chloride 106 101 - 111 mmol/L   CO2 27 22 - 32 mmol/L   Glucose, Bld 123 (H) 65 - 99 mg/dL   BUN 15 6 - 20 mg/dL   Creatinine, Ser 0.75 0.44 - 1.00 mg/dL   Calcium 9.0 8.9 - 10.3 mg/dL   GFR calc non Af Amer >60 >60 mL/min   GFR calc Af Amer >60 >60 mL/min    Comment: (NOTE) The eGFR has been calculated using the CKD EPI equation. This calculation has not been validated in all clinical situations. eGFR's persistently <60 mL/min signify possible Chronic Kidney Disease.    Anion gap 7 5 - 15  CBC with Differential     Status: Abnormal   Collection Time: 04/11/16  3:35 PM  Result Value Ref Range   WBC 10.9 (H) 4.0 - 10.5 K/uL   RBC 3.96 3.87 - 5.11 MIL/uL    Hemoglobin 11.6 (L) 12.0 - 15.0 g/dL   HCT 36.6 36.0 - 46.0 %   MCV 92.4 78.0 - 100.0 fL   MCH 29.3 26.0 - 34.0 pg   MCHC 31.7 30.0 - 36.0 g/dL   RDW 14.9 11.5 - 15.5 %   Platelets 315 150 - 400 K/uL   Neutrophils Relative % 57 %   Neutro Abs 6.2 1.7 - 7.7 K/uL   Lymphocytes Relative 25 %   Lymphs Abs 2.7 0.7 - 4.0 K/uL   Monocytes Relative 8 %   Monocytes Absolute 0.8 0.1 - 1.0 K/uL   Eosinophils Relative 10 %   Eosinophils Absolute 1.1 (H) 0.0 - 0.7 K/uL   Basophils Relative 0 %   Basophils Absolute 0.0 0.0 - 0.1 K/uL  I-Stat venous blood gas, ED     Status: Abnormal   Collection Time: 04/11/16  6:18 PM  Result Value Ref Range   pH, Ven 7.428 (H) 7.250 - 7.300   pCO2, Ven 39.7 (L) 45.0 - 50.0 mmHg   pO2, Ven 63.0 (H) 31.0 - 45.0 mmHg   Bicarbonate 26.2 (H) 20.0 - 24.0 mEq/L   TCO2 27 0 - 100 mmol/L   O2 Saturation 92.0 %   Acid-Base Excess 2.0 0.0 - 2.0 mmol/L   Patient temperature 98.8 F    Collection site RADIAL, ALLEN'S TEST ACCEPTABLE    Drawn by RT    Sample type VENOUS   Respiratory Panel by PCR     Status: None   Collection Time: 04/11/16 10:49 PM  Result Value Ref Range   Adenovirus NOT DETECTED NOT DETECTED   Coronavirus 229E NOT DETECTED NOT DETECTED   Coronavirus HKU1 NOT DETECTED NOT DETECTED   Coronavirus NL63 NOT DETECTED NOT DETECTED   Coronavirus OC43 NOT DETECTED NOT DETECTED   Metapneumovirus NOT DETECTED NOT DETECTED   Rhinovirus / Enterovirus NOT DETECTED NOT DETECTED   Influenza A NOT DETECTED NOT DETECTED   Influenza A H1 NOT DETECTED NOT DETECTED   Influenza A H1 2009 NOT DETECTED NOT DETECTED   Influenza A H3 NOT DETECTED NOT DETECTED   Influenza B NOT DETECTED NOT DETECTED   Parainfluenza Virus 1 NOT DETECTED NOT DETECTED   Parainfluenza Virus 2 NOT DETECTED NOT DETECTED   Parainfluenza Virus 3 NOT DETECTED NOT DETECTED   Parainfluenza Virus 4 NOT DETECTED NOT DETECTED   Respiratory Syncytial Virus NOT DETECTED NOT DETECTED   Bordetella  pertussis NOT DETECTED NOT DETECTED   Chlamydophila pneumoniae NOT DETECTED NOT DETECTED  Mycoplasma pneumoniae NOT DETECTED NOT DETECTED  Culture, blood (routine x 2) Call MD if unable to obtain prior to antibiotics being given     Status: None   Collection Time: 04/11/16 10:50 PM  Result Value Ref Range   Specimen Description BLOOD LEFT ANTECUBITAL    Special Requests      BOTTLES DRAWN AEROBIC AND ANAEROBIC BLUE 8CC RED 10CC   Culture NO GROWTH 5 DAYS    Report Status 04/17/2016 FINAL   MRSA PCR Screening     Status: None   Collection Time: 04/11/16 10:50 PM  Result Value Ref Range   MRSA by PCR NEGATIVE NEGATIVE    Comment:        The GeneXpert MRSA Assay (FDA approved for NASAL specimens only), is one component of a comprehensive MRSA colonization surveillance program. It is not intended to diagnose MRSA infection nor to guide or monitor treatment for MRSA infections.   HIV antibody     Status: None   Collection Time: 04/11/16 10:58 PM  Result Value Ref Range   HIV Screen 4th Generation wRfx Non Reactive Non Reactive    Comment: (NOTE) Performed At: [facility] [place], [place] [id number] Lindon Romp MD [id number]   Culture, blood (routine x 2) Call MD if unable to obtain prior to antibiotics being given     Status: None   Collection Time: 04/11/16 10:58 PM  Result Value Ref Range   Specimen Description BLOOD BLOOD LEFT HAND    Special Requests      BOTTLES DRAWN AEROBIC AND ANAEROBIC BLUE 10 CC RED [place]   Culture NO GROWTH 5 DAYS    Report Status 04/17/2016 FINAL   Troponin I (q 6hr x 3)     Status: None   Collection Time: 04/12/16  3:34 PM  Result Value Ref Range   Troponin I 0.03 <0.031 ng/mL    Comment:        NO INDICATION OF MYOCARDIAL INJURY.   Troponin I (q 6hr x 3)     Status: None   Collection Time: 04/12/16  8:47 PM  Result Value Ref Range   Troponin I <0.03 <0.031 ng/mL    Comment:        NO INDICATION  OF MYOCARDIAL INJURY.   Strep pneumoniae urinary antigen     Status: None   Collection Time: 04/12/16  9:58 PM  Result Value Ref Range   Strep Pneumo Urinary Antigen NEGATIVE NEGATIVE    Comment:        Infection due to S. pneumoniae cannot be absolutely ruled out since the antigen present may be below the detection limit of the test.   Legionella Pneumophila Serogp 1 Ur Ag     Status: None   Collection Time: 04/12/16  9:58 PM  Result Value Ref Range   L. pneumophila Serogp 1 Ur Ag Negative Negative    Comment: (NOTE) Presumptive negative for L. pneumophila serogroup 1 antigen in urine, suggesting no recent or current infection. Legionnaires' disease cannot be ruled out since other serogroups and species may also cause disease. Performed At: [facility] [place], [place] [id number] Lindon Romp MD [id number]    Source of Sample URINE, RANDOM   Troponin I (q 6hr x 3)     Status: None   Collection Time: 04/13/16  3:32 AM  Result Value Ref Range   Troponin I <0.03 <0.031 ng/mL    Comment:  NO INDICATION OF MYOCARDIAL INJURY.   ECHO COMPLETE     Status: Abnormal   Collection Time: 04/13/16 10:00 AM  Result Value Ref Range   Weight 2,292.78 oz   Height 61 in   BP 152/78 mmHg   LV PW d 8.69 (A) 0.6 - 1.1 mm   FS 27 (A) 28 - 44 %   LA vol 41.6 mL   LA ID, A-P, ES 36 mm   IVS/LV PW RATIO, ED .77    LV e' LATERAL 12.4 cm/s   LV E/e' medial 7.77    LV E/e'average 7.77    LA diam index 2.13 cm/m2   LA vol A4C 32.3 ml   E decel time 204 msec   LVOT diameter 20 mm   LVOT area 3.14 cm2   Peak grad 4 mmHg   E/e' ratio 7.77    MV pk E vel 96.3 m/s   MV pk A vel 97.4 m/s   LA vol index 24.6 mL/m2   MV Dec 204    LA diam end sys 36 mm   TDI e' medial 9.03    TDI e' lateral 12.4    Lateral S' vel 14.3 cm/sec   TAPSE 20.4 mm  CBC with Differential/Platelet     Status: Abnormal   Collection Time: 04/14/16  2:45 AM  Result Value Ref  Range   WBC 13.3 (H) 4.0 - 10.5 K/uL   RBC 3.84 (L) 3.87 - 5.11 MIL/uL   Hemoglobin 10.7 (L) 12.0 - 15.0 g/dL   HCT 34.6 (L) 36.0 - 46.0 %   MCV 90.1 78.0 - 100.0 fL   MCH 27.9 26.0 - 34.0 pg   MCHC 30.9 30.0 - 36.0 g/dL   RDW 15.2 11.5 - 15.5 %   Platelets 297 150 - 400 K/uL   Neutrophils Relative % 76 %   Neutro Abs 10.2 (H) 1.7 - 7.7 K/uL   Lymphocytes Relative 16 %   Lymphs Abs 2.1 0.7 - 4.0 K/uL   Monocytes Relative 8 %   Monocytes Absolute 1.0 0.1 - 1.0 K/uL   Eosinophils Relative 0 %   Eosinophils Absolute 0.0 0.0 - 0.7 K/uL   Basophils Relative 0 %   Basophils Absolute 0.0 0.0 - 0.1 K/uL  Basic metabolic panel     Status: Abnormal   Collection Time: 04/14/16  2:45 AM  Result Value Ref Range   Sodium 138 135 - 145 mmol/L   Potassium 3.8 3.5 - 5.1 mmol/L   Chloride 106 101 - 111 mmol/L   CO2 26 22 - 32 mmol/L   Glucose, Bld 104 (H) 65 - 99 mg/dL   BUN 20 6 - 20 mg/dL   Creatinine, Ser 0.81 0.44 - 1.00 mg/dL   Calcium 9.0 8.9 - 10.3 mg/dL   GFR calc non Af Amer >60 >60 mL/min   GFR calc Af Amer >60 >60 mL/min    Comment: (NOTE) The eGFR has been calculated using the CKD EPI equation. This calculation has not been validated in all clinical situations. eGFR's persistently <60 mL/min signify possible Chronic Kidney Disease.    Anion gap 6 5 - 15  Magnesium     Status: None   Collection Time: 04/14/16  2:45 AM  Result Value Ref Range   Magnesium 2.3 1.7 - 2.4 mg/dL   Assessment/Plan: 1. COPD with acute bronchitis ([place]) Duoneb given in office with improvement. Rx Doxycycline. Patient to restart albuterol inhaler. Continue Advair as directed. Mucinex per orders. Rx  prednisone burst to start if wheezing not improving. Supportive measures reviewed.  - ipratropium-albuterol (DUONEB) 0.5-2.5 (3) MG/3ML nebulizer solution 3 mL; Take 3 mLs by nebulization every 6 (six) hours.   Leeanne Rio, PA-C

## 2016-07-09 ENCOUNTER — Ambulatory Visit (HOSPITAL_BASED_OUTPATIENT_CLINIC_OR_DEPARTMENT_OTHER)
Admission: RE | Admit: 2016-07-09 | Discharge: 2016-07-09 | Disposition: A | Discharge status: Home / Self Care | Payer: BLUE CROSS/BLUE SHIELD | Source: Ambulatory Visit | Attending: Family Medicine | Admitting: Family Medicine

## 2016-07-09 ENCOUNTER — Ambulatory Visit (INDEPENDENT_AMBULATORY_CARE_PROVIDER_SITE_OTHER): Payer: BLUE CROSS/BLUE SHIELD | Admitting: Family Medicine

## 2016-07-09 ENCOUNTER — Encounter: Payer: Self-pay | Admitting: Family Medicine

## 2016-07-09 DIAGNOSIS — I10 Essential (primary) hypertension: Secondary | ICD-10-CM

## 2016-07-09 DIAGNOSIS — J441 Chronic obstructive pulmonary disease with (acute) exacerbation: Secondary | ICD-10-CM

## 2016-07-09 DIAGNOSIS — J440 Chronic obstructive pulmonary disease with acute lower respiratory infection: Secondary | ICD-10-CM

## 2016-07-09 DIAGNOSIS — J209 Acute bronchitis, unspecified: Secondary | ICD-10-CM

## 2016-07-09 DIAGNOSIS — R05 Cough: Secondary | ICD-10-CM | POA: Insufficient documentation

## 2016-07-09 MED ORDER — METHYLPREDNISOLONE ACETATE 40 MG/ML IJ SUSP
40.0000 mg | Freq: Once | INTRAMUSCULAR | Status: AC
  Administered 2016-07-09: 40 mg via INTRAMUSCULAR

## 2016-07-09 MED ORDER — HYDROCOD POLST-CPM POLST ER 10-8 MG/5ML PO SUER: 5.0000 mL | Freq: Every evening | ORAL | 0 refills | Status: DC | PRN

## 2016-07-09 MED ORDER — BENZONATATE 100 MG PO CAPS: 100.0000 mg | ORAL_CAPSULE | Freq: Three times a day (TID) | ORAL | 2 refills | Status: DC | PRN

## 2016-07-09 MED ORDER — LEVOFLOXACIN 500 MG PO TABS: 500.0000 mg | ORAL_TABLET | Freq: Every day | ORAL | 0 refills | Status: AC

## 2016-07-09 MED ORDER — METHYLPREDNISOLONE ACETATE 40 MG/ML IJ SUSP: 40.0000 mg | Freq: Once | INTRAMUSCULAR | Status: DC

## 2016-07-09 MED ORDER — PREDNISONE 10 MG PO TABS: ORAL_TABLET | ORAL | 0 refills | Status: DC

## 2016-07-09 MED FILL — BENZONATATE 100 MG CAPSULE: 100 | 10 days supply | Qty: 30 | Fill #0

## 2016-07-09 MED FILL — HYDROCODONE-CHLORPHENIRAM S: 10-8 | 28 days supply | Qty: 140 | Fill #0

## 2016-07-09 MED FILL — levoFLOXacin 500 MG TABS: 500 | 10 days supply | Qty: 10 | Fill #0

## 2016-07-09 MED FILL — predniSONE 10 MG TABS: 10 | 7 days supply | Qty: 28 | Fill #0

## 2016-07-09 NOTE — Patient Instructions (Signed)

## 2016-07-09 NOTE — Progress Notes (Signed)
Pre visit review using our clinic review tool, if applicable. No additional management support is needed unless otherwise documented below in the visit note. 

## 2016-07-09 NOTE — Assessment & Plan Note (Signed)
Well controlled, no changes to meds. Encouraged heart healthy diet such as the DASH diet and exercise as tolerated.  °

## 2016-07-09 NOTE — Progress Notes (Signed)
Patient ID: Morgan Harper, female   DOB: Nov 16, 1955, 60 y.o.   MRN: [id number]     Subjective:    Patient ID: Morgan Harper, female    DOB: Oct 18, 1956, 60 y.o.   MRN: [id number]  Chief Complaint  Patient presents with  . Cough  . Wheezing    HPI Patient is in today for follow up on cough. She was previously treated here but symptoms persist. Cough is productive, maliase, fatigue, headaches persists. Denies CP/palp/SOB/HA/GI or GU c/o. Taking meds as prescribed  Past Medical History:  Diagnosis Date  . Anemia 01/13/2016  . Chronic bronchitis (HCC)   . Depression 09/24/2015  . Diarrhea 04/29/2016  . GERD (gastroesophageal reflux disease)   . H/O measles   . H/O mumps   . Hiatal hernia   . History of chicken pox   . Hyperlipidemia, mixed 09/24/2015  . Hypertension   . Insomnia     Past Surgical History:  Procedure Laterality Date  . TONSILLECTOMY      Family History  Problem Relation Age of Onset  . Hypertension Mother   . Allergies Mother   . Asthma Mother   . COPD Mother     previous smoker  . Heart disease Neg Hx   . Diabetes Sister   . Alzheimer's disease Maternal Grandfather     Social History   Social History  . Marital status: Married    Spouse name: N/A  . Number of children: N/A  . Years of education: N/A   Occupational History  . Not on file.   Social History Main Topics  . Smoking status: Former Smoker    Packs/day: 1.00    Years: 45.00    Types: Cigarettes    Quit date: 06/13/2016  . Smokeless tobacco: Not on file  . Alcohol use No  . Drug use: No  . 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 Not on file   Social History Narrative  . No narrative on file    Outpatient Medications Prior to Visit  Medication Sig Dispense Refill  . amLODipine (NORVASC) 10 MG tablet Take 1 tablet (10 mg total) by mouth daily. 30 tablet 0  .  Fluticasone-Salmeterol (ADVAIR DISKUS) 500-50 MCG/DOSE AEPB Inhale 1 puff into the lungs 2 (two) times daily. 60 each 0  . sertraline (ZOLOFT) 100 MG tablet Take 1 tablet (100 mg total) by mouth daily. 90 tablet 1  . doxycycline (VIBRAMYCIN) 100 MG capsule Take 1 capsule (100 mg total) by mouth 2 (two) times daily. 14 capsule 0  . predniSONE (DELTASONE) 20 MG tablet Take 2 tablets (40 mg total) by mouth daily with breakfast. 10 tablet 0   Facility-Administered Medications Prior to Visit  Medication Dose Route Frequency Provider Last Rate Last Dose  . ipratropium-albuterol (DUONEB) 0.5-2.5 (3) MG/3ML nebulizer solution 3 mL  3 mL Nebulization Q6H Waldon Merl, PA-C   3 mL at 06/27/16 1416    No Known Allergies  Review of Systems  Constitutional: Positive for malaise/fatigue. Negative for fever.  HENT: Positive for congestion.   Eyes: Negative for blurred vision.  Respiratory: Positive for cough, sputum production, shortness of breath and wheezing. Negative for hemoptysis.   Cardiovascular: Negative for chest pain, palpitations and leg swelling.  Gastrointestinal: Negative for abdominal pain, blood in stool and nausea.  Genitourinary: Negative for dysuria and frequency.  Musculoskeletal: Positive for  myalgias. Negative for falls.  Skin: Negative for rash.  Neurological: Negative for dizziness, loss of consciousness and headaches.  Endo/Heme/Allergies: Negative for environmental allergies.  Psychiatric/Behavioral: Negative for depression. The patient is not nervous/anxious.        Objective:    Physical Exam  BP 114/70 (BP Location: Left Arm, Patient Position: Sitting, Cuff Size: Normal)   Pulse 94   Temp 98.5 F (36.9 C) (Oral)   Ht 5\' 2"  (1.575 m)   Wt 143 lb (64.9 kg)   BMI 26.16 kg/m  Wt Readings from Last 3 Encounters:  07/09/16 143 lb (64.9 kg)  06/27/16 144 lb (65.3 kg)  04/17/16 141 lb 2 oz (64 kg)     Lab Results  Component Value Date   WBC 13.3 (H)  04/14/2016   HGB 10.7 (L) 04/14/2016   HCT 34.6 (L) 04/14/2016   PLT 297 04/14/2016   GLUCOSE 104 (H) 04/14/2016   CHOL 215 (H) 01/13/2016   TRIG 87.0 01/13/2016   HDL 62.20 01/13/2016   LDLCALC 136 (H) 01/13/2016   ALT 11 01/13/2016   AST 10 01/13/2016   NA 138 04/14/2016   K 3.8 04/14/2016   CL 106 04/14/2016   CREATININE 0.81 04/14/2016   BUN 20 04/14/2016   CO2 26 04/14/2016   TSH 1.09 01/13/2016    Lab Results  Component Value Date   TSH 1.09 01/13/2016   Lab Results  Component Value Date   WBC 13.3 (H) 04/14/2016   HGB 10.7 (L) 04/14/2016   HCT 34.6 (L) 04/14/2016   MCV 90.1 04/14/2016   PLT 297 04/14/2016   Lab Results  Component Value Date   NA 138 04/14/2016   K 3.8 04/14/2016   CO2 26 04/14/2016   GLUCOSE 104 (H) 04/14/2016   BUN 20 04/14/2016   CREATININE 0.81 04/14/2016   BILITOT 0.3 01/13/2016   ALKPHOS 68 01/13/2016   AST 10 01/13/2016   ALT 11 01/13/2016   PROT 6.9 01/13/2016   ALBUMIN 4.2 01/13/2016   CALCIUM 9.0 04/14/2016   ANIONGAP 6 04/14/2016   GFR 83.45 01/13/2016   Lab Results  Component Value Date   CHOL 215 (H) 01/13/2016   Lab Results  Component Value Date   HDL 62.20 01/13/2016   Lab Results  Component Value Date   LDLCALC 136 (H) 01/13/2016   Lab Results  Component Value Date   TRIG 87.0 01/13/2016   Lab Results  Component Value Date   CHOLHDL 3 01/13/2016   No results found for: HGBA1C     Assessment & Plan:   Problem List Items Addressed This Visit    Essential hypertension    Well controlled, no changes to meds. Encouraged heart healthy diet such as the DASH diet and exercise as tolerated.       COPD exacerbation (HCC)    Given rx for levaquin, hydromet qhs, tessalon perles, depo medrol. Return if no improvement.       Relevant Medications   chlorpheniramine-HYDROcodone (TUSSIONEX PENNKINETIC ER) 10-8 MG/5ML SUER   benzonatate (TESSALON) 100 MG capsule   predniSONE (DELTASONE) 10 MG tablet    methylPREDNISolone acetate (DEPO-MEDROL) injection 40 mg   methylPREDNISolone acetate (DEPO-MEDROL) injection 40 mg (Completed)    Other Visit Diagnoses    COPD with acute bronchitis (HCC)       Relevant Medications   chlorpheniramine-HYDROcodone (TUSSIONEX PENNKINETIC ER) 10-8 MG/5ML SUER   benzonatate (TESSALON) 100 MG capsule   levofloxacin (LEVAQUIN) 500 MG tablet   predniSONE (DELTASONE)  10 MG tablet   methylPREDNISolone acetate (DEPO-MEDROL) injection 40 mg   methylPREDNISolone acetate (DEPO-MEDROL) injection 40 mg (Completed)   Other Relevant Orders   DG Chest 2 View (Completed)      I have discontinued Ms. Berisha's doxycycline. I have also changed her chlorpheniramine-HYDROcodone. Additionally, I am having her maintain her amLODipine, Fluticasone-Salmeterol, sertraline, benzonatate, levofloxacin, and predniSONE. We administered methylPREDNISolone acetate. We will continue to administer ipratropium-albuterol and methylPREDNISolone acetate.  Meds ordered this encounter  Medications  . chlorpheniramine-HYDROcodone (TUSSIONEX PENNKINETIC ER) 10-8 MG/5ML SUER    Sig: Take 5 mLs by mouth at bedtime as needed.    Dispense:  140 mL    Refill:  0  . benzonatate (TESSALON) 100 MG capsule    Sig: Take 1 capsule (100 mg total) by mouth 3 (three) times daily as needed for cough.    Dispense:  30 capsule    Refill:  2  . levofloxacin (LEVAQUIN) 500 MG tablet    Sig: Take 1 tablet (500 mg total) by mouth daily.    Dispense:  10 tablet    Refill:  0  . predniSONE (DELTASONE) 10 MG tablet    Sig: 7 tab po day 1, 6 tab po day 2, 5 tab po day 3, 4 tab po day 4, 3 tab pod day 5, 2 tab po day 6, 1 tab po day 7.    Dispense:  28 tablet    Refill:  0  . methylPREDNISolone acetate (DEPO-MEDROL) injection 40 mg  . methylPREDNISolone acetate (DEPO-MEDROL) injection 40 mg     [name], [name], MD

## 2016-07-09 NOTE — Assessment & Plan Note (Signed)
Given rx for levaquin, hydromet qhs, tessalon perles, depo medrol. Return if no improvement.

## 2016-07-10 ENCOUNTER — Ambulatory Visit (INDEPENDENT_AMBULATORY_CARE_PROVIDER_SITE_OTHER): Payer: BLUE CROSS/BLUE SHIELD | Admitting: Family Medicine

## 2016-07-10 ENCOUNTER — Encounter: Payer: Self-pay | Admitting: Family Medicine

## 2016-07-10 VITALS — BP 138/80 | HR 97 | Temp 98.1°F | Ht 62.0 in | Wt 142.5 lb

## 2016-07-10 DIAGNOSIS — Z720 Tobacco use: Secondary | ICD-10-CM

## 2016-07-10 DIAGNOSIS — J441 Chronic obstructive pulmonary disease with (acute) exacerbation: Secondary | ICD-10-CM | POA: Diagnosis not present

## 2016-07-10 DIAGNOSIS — I10 Essential (primary) hypertension: Secondary | ICD-10-CM | POA: Diagnosis not present

## 2016-07-10 MED ORDER — ALBUTEROL SULFATE (2.5 MG/3ML) 0.083% IN NEBU
2.5000 mg | INHALATION_SOLUTION | Freq: Once | RESPIRATORY_TRACT | Status: AC
  Administered 2016-07-10: 2.5 mg via RESPIRATORY_TRACT

## 2016-07-10 MED ORDER — AEROCHAMBER PLUS MISC: 2 refills | Status: AC

## 2016-07-10 MED FILL — MICROCHAMBER: 30 days supply | Qty: 1 | Fill #0

## 2016-07-10 NOTE — Assessment & Plan Note (Signed)
Well controlled, no changes to meds. Encouraged heart healthy diet such as the DASH diet and exercise as tolerated.  °

## 2016-07-10 NOTE — Progress Notes (Signed)
Pre visit review using our clinic review tool, if applicable. No additional management support is needed unless otherwise documented below in the visit note. 

## 2016-07-10 NOTE — Progress Notes (Signed)
Patient ID: Morgan Harper, female   DOB: 06-Nov-1955, 60 y.o.   MRN: [id number]   Subjective:    Patient ID: Morgan Harper, female    DOB: [birth year], 60 y.o.   MRN: [id number]  Chief Complaint  Patient presents with  . Follow-up    breathing treatment    HPI Patient is in today for evaluation of persistent wheezing. Was in yesterday with copd exacerbation and started on Steroids and antibiotics. This am she is still wheezing and coughing, did not rest well last night. Requesting a nebulizer treatment. She is leaving for [facility] for a 3 month job later this week and is hoping to feel better. Denies CP/palp/HA/fevers/GI or GU c/o. Taking meds as prescribed  Past Medical History:  Diagnosis Date  . Anemia 01/13/2016  . Chronic bronchitis (HCC)   . Depression 09/24/2015  . Diarrhea 04/29/2016  . GERD (gastroesophageal reflux disease)   . H/O measles   . H/O mumps   . Hiatal hernia   . History of chicken pox   . Hyperlipidemia, mixed 09/24/2015  . Hypertension   . Insomnia     Past Surgical History:  Procedure Laterality Date  . TONSILLECTOMY      Family History  Problem Relation Age of Onset  . Hypertension Mother   . Allergies Mother   . Asthma Mother   . COPD Mother     previous smoker  . Heart disease Neg Hx   . Diabetes Sister   . Alzheimer's disease Maternal Grandfather     Social History   Social History  . Marital status: Married    Spouse name: N/A  . Number of children: N/A  . Years of education: N/A   Occupational History  . Not on file.   Social History Main Topics  . Smoking status: Former Smoker    Packs/day: 1.00    Years: 45.00    Types: Cigarettes    Quit date: 06/13/2016  . Smokeless tobacco: Not on file  . Alcohol use No  . Drug use: No  . 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 Not on file   Social History Narrative  . No  narrative on file    Outpatient Medications Prior to Visit  Medication Sig Dispense Refill  . amLODipine (NORVASC) 10 MG tablet Take 1 tablet (10 mg total) by mouth daily. 30 tablet 0  . benzonatate (TESSALON) 100 MG capsule Take 1 capsule (100 mg total) by mouth 3 (three) times daily as needed for cough. 30 capsule 2  . chlorpheniramine-HYDROcodone (TUSSIONEX PENNKINETIC ER) 10-8 MG/5ML SUER Take 5 mLs by mouth at bedtime as needed. 140 mL 0  . Fluticasone-Salmeterol (ADVAIR DISKUS) 500-50 MCG/DOSE AEPB Inhale 1 puff into the lungs 2 (two) times daily. 60 each 0  . levofloxacin (LEVAQUIN) 500 MG tablet Take 1 tablet (500 mg total) by mouth daily. 10 tablet 0  . predniSONE (DELTASONE) 10 MG tablet 7 tab po day 1, 6 tab po day 2, 5 tab po day 3, 4 tab po day 4, 3 tab pod day 5, 2 tab po day 6, 1 tab po day 7. 28 tablet 0  . sertraline (ZOLOFT) 100 MG tablet Take 1 tablet (100 mg total) by mouth daily. 90 tablet 1   Facility-Administered Medications Prior to Visit  Medication Dose Route Frequency Provider Last Rate Last Dose  . ipratropium-albuterol (  DUONEB) 0.5-2.5 (3) MG/3ML nebulizer solution 3 mL  3 mL Nebulization Q6H Waldon MerlWilliam C Martin, PA-C   3 mL at 06/27/16 1416  . methylPREDNISolone acetate (DEPO-MEDROL) injection 40 mg  40 mg Intramuscular Once Bradd CanaryStacey A Yaxiel Minnie, MD        No Known Allergies  Review of Systems  Constitutional: Positive for malaise/fatigue. Negative for fever.  HENT: Negative for congestion.   Eyes: Negative for blurred vision.  Respiratory: Positive for cough, sputum production, shortness of breath and wheezing. Negative for stridor.   Cardiovascular: Negative for chest pain, palpitations and leg swelling.  Gastrointestinal: Negative for abdominal pain, blood in stool and nausea.  Genitourinary: Negative for dysuria and frequency.  Musculoskeletal: Negative for falls.  Skin: Negative for rash.  Neurological: Negative for dizziness, loss of consciousness and  headaches.  Endo/Heme/Allergies: Negative for environmental allergies.  Psychiatric/Behavioral: Negative for depression. The patient is not nervous/anxious.        Objective:    Physical Exam  Constitutional: She is oriented to person, place, and time. She appears well-developed and well-nourished. No distress.  HENT:  Head: Normocephalic and atraumatic.  Nose: Nose normal.  Eyes: Right eye exhibits no discharge. Left eye exhibits no discharge.  Neck: Normal range of motion. Neck supple.  Cardiovascular: Normal rate and regular rhythm.   No murmur heard. Pulmonary/Chest: Effort normal. She has wheezes.  Abdominal: Soft. Bowel sounds are normal. There is no tenderness.  Musculoskeletal: She exhibits no edema.  Neurological: She is alert and oriented to person, place, and time.  Skin: Skin is warm and dry.  Psychiatric: She has a normal mood and affect.  Nursing note and vitals reviewed.   BP 138/80 (BP Location: Left Arm, Patient Position: Sitting, Cuff Size: Normal)   Pulse 97   Temp 98.1 F (36.7 C) (Oral)   Ht 5\' 2"  (1.575 m)   Wt 142 lb 8 oz (64.6 kg)   SpO2 94%   BMI 26.06 kg/m  Wt Readings from Last 3 Encounters:  07/10/16 142 lb 8 oz (64.6 kg)  07/09/16 143 lb (64.9 kg)  06/27/16 144 lb (65.3 kg)     Lab Results  Component Value Date   WBC 13.3 (H) 04/14/2016   HGB 10.7 (L) 04/14/2016   HCT 34.6 (L) 04/14/2016   PLT 297 04/14/2016   GLUCOSE 104 (H) 04/14/2016   CHOL 215 (H) 01/13/2016   TRIG 87.0 01/13/2016   HDL 62.20 01/13/2016   LDLCALC 136 (H) 01/13/2016   ALT 11 01/13/2016   AST 10 01/13/2016   NA 138 04/14/2016   K 3.8 04/14/2016   CL 106 04/14/2016   CREATININE 0.81 04/14/2016   BUN 20 04/14/2016   CO2 26 04/14/2016   TSH 1.09 01/13/2016    Lab Results  Component Value Date   TSH 1.09 01/13/2016   Lab Results  Component Value Date   WBC 13.3 (H) 04/14/2016   HGB 10.7 (L) 04/14/2016   HCT 34.6 (L) 04/14/2016   MCV 90.1 04/14/2016    PLT 297 04/14/2016   Lab Results  Component Value Date   NA 138 04/14/2016   K 3.8 04/14/2016   CO2 26 04/14/2016   GLUCOSE 104 (H) 04/14/2016   BUN 20 04/14/2016   CREATININE 0.81 04/14/2016   BILITOT 0.3 01/13/2016   ALKPHOS 68 01/13/2016   AST 10 01/13/2016   ALT 11 01/13/2016   PROT 6.9 01/13/2016   ALBUMIN 4.2 01/13/2016   CALCIUM 9.0 04/14/2016   ANIONGAP 6 04/14/2016  GFR 83.45 01/13/2016   Lab Results  Component Value Date   CHOL 215 (H) 01/13/2016   Lab Results  Component Value Date   HDL 62.20 01/13/2016   Lab Results  Component Value Date   LDLCALC 136 (H) 01/13/2016   Lab Results  Component Value Date   TRIG 87.0 01/13/2016   Lab Results  Component Value Date   CHOLHDL 3 01/13/2016   No results found for: HGBA1C     Assessment & Plan:   Problem List Items Addressed This Visit    Essential hypertension    Well controlled, no changes to meds. Encouraged heart healthy diet such as the DASH diet and exercise as tolerated.       Tobacco abuse    Encouraged complete cessation. Discussed need to quit as relates to risk of numerous cancers, cardiac and pulmonary disease as well as neurologic complications. Counseled for greater than 3 minutes      COPD exacerbation (HCC) - Primary    Seen yesterday and started on steroids and and antibiotics. Still wheezing considerably, in today for nebulizer treatment and given rx for spacer to use with her HFA and report if no improvement      Relevant Medications   albuterol (PROVENTIL) (2.5 MG/3ML) 0.083% nebulizer solution 2.5 mg (Completed)   Other Relevant Orders   PR INHAL RX, AIRWAY OBST/DX SPUTUM INDUCT    Other Visit Diagnoses   None.     I am having Ms. Alderfer start on AEROCHAMBER PLUS. I am also having her maintain her amLODipine, Fluticasone-Salmeterol, sertraline, chlorpheniramine-HYDROcodone, benzonatate, levofloxacin, and predniSONE. We administered albuterol. We will continue to administer  ipratropium-albuterol and methylPREDNISolone acetate.  Meds ordered this encounter  Medications  . Spacer/Aero-Holding Chambers (AEROCHAMBER PLUS) inhaler    Sig: Use as instructed    Dispense:  1 each    Refill:  2  . albuterol (PROVENTIL) (2.5 MG/3ML) 0.083% nebulizer solution 2.5 mg     Danise Edge, MD

## 2016-07-10 NOTE — Patient Instructions (Signed)
Chronic Obstructive Pulmonary Disease Chronic obstructive pulmonary disease (COPD) is a common lung condition in which airflow from the lungs is limited. COPD is a general term that can be used to describe many different lung problems that limit airflow, including both chronic bronchitis and emphysema. If you have COPD, your lung function will probably never return to normal, but there are measures you can take to improve lung function and make yourself feel better. CAUSES   Smoking (common).  Exposure to secondhand smoke.  Genetic problems.  Chronic inflammatory lung diseases or recurrent infections. SYMPTOMS  Shortness of breath, especially with physical activity.  Deep, persistent (chronic) cough with a large amount of thick mucus.  Wheezing.  Rapid breaths (tachypnea).  Gray or bluish discoloration (cyanosis) of the skin, especially in your fingers, toes, or lips.  Fatigue.  Weight loss.  Frequent infections or episodes when breathing symptoms become much worse (exacerbations).  Chest tightness. DIAGNOSIS Your health care provider will take a medical history and perform a physical examination to diagnose COPD. Additional tests for COPD may include:  Lung (pulmonary) function tests.  Chest X-ray.  CT scan.  Blood tests. TREATMENT  Treatment for COPD may include:  Inhaler and nebulizer medicines. These help manage the symptoms of COPD and make your breathing more comfortable.  Supplemental oxygen. Supplemental oxygen is only helpful if you have a low oxygen level in your blood.  Exercise and physical activity. These are beneficial for nearly all people with COPD.  Lung surgery or transplant.  Nutrition therapy to gain weight, if you are underweight.  Pulmonary rehabilitation. This may involve working with a team of health care providers and specialists, such as respiratory, occupational, and physical therapists. HOME CARE INSTRUCTIONS  Take all medicines  (inhaled or pills) as directed by your health care provider.  Avoid over-the-counter medicines or cough syrups that dry up your airway (such as antihistamines) and slow down the elimination of secretions unless instructed otherwise by your health care provider.  If you are a smoker, the most important thing that you can do is stop smoking. Continuing to smoke will cause further lung damage and breathing trouble. Ask your health care provider for help with quitting smoking. He or she can direct you to community resources or hospitals that provide support.  Avoid exposure to irritants such as smoke, chemicals, and fumes that aggravate your breathing.  Use oxygen therapy and pulmonary rehabilitation if directed by your health care provider. If you require home oxygen therapy, ask your health care provider whether you should purchase a pulse oximeter to measure your oxygen level at home.  Avoid contact with individuals who have a contagious illness.  Avoid extreme temperature and humidity changes.  Eat healthy foods. Eating smaller, more frequent meals and resting before meals may help you maintain your strength.  Stay active, but balance activity with periods of rest. Exercise and physical activity will help you maintain your ability to do things you want to do.  Preventing infection and hospitalization is very important when you have COPD. Make sure to receive all the vaccines your health care provider recommends, especially the pneumococcal and influenza vaccines. Ask your health care provider whether you need a pneumonia vaccine.  Learn and use relaxation techniques to manage stress.  Learn and use controlled breathing techniques as directed by your health care provider. Controlled breathing techniques include:  Pursed lip breathing. Start by breathing in (inhaling) through your nose for 1 second. Then, purse your lips as if you were   going to whistle and breathe out (exhale) through the  pursed lips for 2 seconds.  Diaphragmatic breathing. Start by putting one hand on your abdomen just above your waist. Inhale slowly through your nose. The hand on your abdomen should move out. Then purse your lips and exhale slowly. You should be able to feel the hand on your abdomen moving in as you exhale.  Learn and use controlled coughing to clear mucus from your lungs. Controlled coughing is a series of short, progressive coughs. The steps of controlled coughing are: 1. Lean your head slightly forward. 2. Breathe in deeply using diaphragmatic breathing. 3. Try to hold your breath for 3 seconds. 4. Keep your mouth slightly open while coughing twice. 5. Spit any mucus out into a tissue. 6. Rest and repeat the steps once or twice as needed. SEEK MEDICAL CARE IF:  You are coughing up more mucus than usual.  There is a change in the color or thickness of your mucus.  Your breathing is more labored than usual.  Your breathing is faster than usual. SEEK IMMEDIATE MEDICAL CARE IF:  You have shortness of breath while you are resting.  You have shortness of breath that prevents you from:  Being able to talk.  Performing your usual physical activities.  You have chest pain lasting longer than 5 minutes.  Your skin color is more cyanotic than usual.  You measure low oxygen saturations for longer than 5 minutes with a pulse oximeter. MAKE SURE YOU:  Understand these instructions.  Will watch your condition.  Will get help right away if you are not doing well or get worse.   This information is not intended to replace advice given to you by your health care provider. Make sure you discuss any questions you have with your health care provider.   Document Released: 07/25/2005 Document Revised: 11/05/2014 Document Reviewed: 06/11/2013 Elsevier Interactive Patient Education 2016 [employer]  

## 2016-07-10 NOTE — Assessment & Plan Note (Signed)
Encouraged complete cessation. Discussed need to quit as relates to risk of numerous cancers, cardiac and pulmonary disease as well as neurologic complications. Counseled for greater than 3 minutes 

## 2016-07-10 NOTE — Assessment & Plan Note (Signed)
Seen yesterday and started on steroids and and antibiotics. Still wheezing considerably, in today for nebulizer treatment and given rx for spacer to use with her HFA and report if no improvement

## 2016-07-26 MED FILL — ADVAIR 500/50 DISKUS: 500-50 | 30 days supply | Qty: 60 | Fill #0

## 2016-09-17 ENCOUNTER — Ambulatory Visit: Payer: BLUE CROSS/BLUE SHIELD | Admitting: Medical

## 2016-09-17 ENCOUNTER — Telehealth: Payer: Self-pay | Admitting: Family Medicine

## 2016-09-17 NOTE — Telephone Encounter (Signed)
Patient lvm 09/17/16 at 7:43am cancelling her 11:15am appointment, charge or no charge

## 2016-09-17 NOTE — Telephone Encounter (Signed)
No charge. 

## 2016-09-22 ENCOUNTER — Ambulatory Visit (INDEPENDENT_AMBULATORY_CARE_PROVIDER_SITE_OTHER): Payer: BLUE CROSS/BLUE SHIELD | Admitting: Family Medicine

## 2016-09-22 ENCOUNTER — Encounter: Payer: Self-pay | Admitting: Family Medicine

## 2016-09-22 DIAGNOSIS — J441 Chronic obstructive pulmonary disease with (acute) exacerbation: Secondary | ICD-10-CM | POA: Diagnosis not present

## 2016-09-22 MED ORDER — BENZONATATE 100 MG PO CAPS: 100.0000 mg | ORAL_CAPSULE | Freq: Three times a day (TID) | ORAL | 0 refills | Status: DC | PRN

## 2016-09-22 MED ORDER — LEVOFLOXACIN 500 MG PO TABS: 500.0000 mg | ORAL_TABLET | Freq: Every day | ORAL | 0 refills | Status: DC

## 2016-09-22 MED ORDER — METHYLPREDNISOLONE ACETATE 40 MG/ML IJ SUSP
40.0000 mg | Freq: Once | INTRAMUSCULAR | Status: AC
  Administered 2016-09-22: 40 mg via INTRAMUSCULAR

## 2016-09-22 MED ORDER — PREDNISONE 10 MG PO TABS: ORAL_TABLET | ORAL | 0 refills | Status: DC

## 2016-09-22 MED ORDER — FLUTICASONE-SALMETEROL 500-50 MCG/DOSE IN AEPB: 1.0000 | INHALATION_SPRAY | Freq: Two times a day (BID) | RESPIRATORY_TRACT | 0 refills | Status: DC

## 2016-09-22 MED ORDER — HYDROCOD POLST-CPM POLST ER 10-8 MG/5ML PO SUER: 5.0000 mL | Freq: Every evening | ORAL | 0 refills | Status: DC | PRN

## 2016-09-22 NOTE — Progress Notes (Signed)
[facility] [address], [address] [place], [place] [address] [phone]  Date:  09/22/2016   Name:  Morgan Harper Stills   DOB:  [birth year]   MRN:  [id number]  PCP:  [name], [name], MD    Chief Complaint: Cough (wheezing, sob, cough x wednesday )   History of Present Illness:  Morgan Harper Kelly is a 60 y.o. very pleasant female patient who presents with the following:  Here today with illness- seen about 2 months ago with wheezing and a  COPD exacerbation.  Since then she has done pretty well until the last 3-4 days when her sx returned.  She is planning to travel for work for about 3 weeks (leaving for [place] for about 3 weeks) and is afraid that she will get worse again   She is generally treated with abx and prednisone- this will usually work for her and get he back to normal She does have ventolin, but she ran out of her advair. However she notes that when she is more sick using ventolin does not really help her.   No fever, she has noted sx of wheezing, feeling more SOB with exertion, cough.  The cough is sometimes productive of yellow material.  This does seem like a typical exacerbation to her and she notes that these are her usual COPD exacerbation sx No CP  She has tried other abx such as azithro and doxy, but LQ is the only thing that seems to work for her.    She quit smoking about 6 months ago and is hoping that her lungs will improve soon   Patient Active Problem List   Diagnosis Date Noted  . Diarrhea 04/29/2016  . Acute respiratory failure with hypoxia (HCC) 04/11/2016  . Anemia 01/13/2016  . Hyperlipidemia, mixed 09/24/2015  . Depression 09/24/2015  . History of chicken pox   . COPD exacerbation (HCC) 08/09/2015  . Acute bronchitis 10/24/2014  . Routine general medical examination at a health care facility 09/30/2014  . Tobacco abuse 06/04/2013  . Essential hypertension 11/06/2008  . GERD 03/06/2008     Past Medical History:  Diagnosis Date  . Anemia 01/13/2016  . Chronic bronchitis (HCC)   . Depression 09/24/2015  . Diarrhea 04/29/2016  . GERD (gastroesophageal reflux disease)   . H/O measles   . H/O mumps   . Hiatal hernia   . History of chicken pox   . Hyperlipidemia, mixed 09/24/2015  . Hypertension   . Insomnia     Past Surgical History:  Procedure Laterality Date  . TONSILLECTOMY      Social History  Substance Use Topics  . Smoking status: Former Smoker    Packs/day: 1.00    Years: 45.00    Types: Cigarettes    Quit date: 06/13/2016  . Smokeless tobacco: Not on file  . Alcohol use No    Family History  Problem Relation Age of Onset  . Hypertension Mother   . Allergies Mother   . Asthma Mother   . COPD Mother     previous smoker  . Heart disease Neg Hx   . Diabetes Sister   . Alzheimer's disease Maternal Grandfather     No Known Allergies  Medication list has been reviewed and updated.  Current Outpatient Prescriptions on File Prior to Visit  Medication Sig Dispense Refill  . amLODipine (NORVASC) 10 MG tablet Take 1 tablet (10 mg total) by mouth daily. 30 tablet 0  .  benzonatate (TESSALON) 100 MG capsule Take 1 capsule (100 mg total) by mouth 3 (three) times daily as needed for cough. 30 capsule 2  . chlorpheniramine-HYDROcodone (TUSSIONEX PENNKINETIC ER) 10-8 MG/5ML SUER Take 5 mLs by mouth at bedtime as needed. 140 mL 0  . Fluticasone-Salmeterol (ADVAIR DISKUS) 500-50 MCG/DOSE AEPB Inhale 1 puff into the lungs 2 (two) times daily. 60 each 0  . predniSONE (DELTASONE) 10 MG tablet 7 tab po day 1, 6 tab po day 2, 5 tab po day 3, 4 tab po day 4, 3 tab pod day 5, 2 tab po day 6, 1 tab po day 7. 28 tablet 0  . sertraline (ZOLOFT) 100 MG tablet Take 1 tablet (100 mg total) by mouth daily. 90 tablet 1  . Spacer/Aero-Holding Chambers (AEROCHAMBER PLUS) inhaler Use as instructed 1 each 2   Current Facility-Administered Medications on File Prior to Visit   Medication Dose Route Frequency Provider Last Rate Last Dose  . ipratropium-albuterol (DUONEB) 0.5-2.5 (3) MG/3ML nebulizer solution 3 mL  3 mL Nebulization Q6H Waldon MerlWilliam C Martin, PA-C   3 mL at 06/27/16 1416  . methylPREDNISolone acetate (DEPO-MEDROL) injection 40 mg  40 mg Intramuscular Once Bradd CanaryStacey A Blyth, MD        Review of Systems:  As per HPI- otherwise negative.   Physical Examination: Vitals:   09/22/16 0935  BP: 128/82  Pulse: 61  Resp: 16  Temp: 98.3 F (36.8 C)   Vitals:   09/22/16 0935  Weight: 150 lb (68 kg)  Height: 5\' 2"  (1.575 m)   Body mass index is 27.44 kg/m. Ideal Body Weight: Weight in (lb) to have BMI = 25: 136.4  GEN: WDWN, NAD, Non-toxic, A & O x 3, mild overweight, looks well, coughing some in room HEENT: Atraumatic, Normocephalic. Neck supple. No masses, No LAD.  Bilateral TM wnl, oropharynx normal.  PEERL,EOMI.   Ears and Nose: No external deformity. CV: RRR, No M/G/R. No JVD. No thrill. No extra heart sounds. PULM: CTA B, no wheezes, crackles, rhonchi. No retractions. No resp. distress. No accessory muscle use. EXTR: No c/c/e NEURO Normal gait.  PSYCH: Normally interactive. Conversant. Not depressed or anxious appearing.  Calm demeanor.    Assessment and Plan: COPD exacerbation (HCC) - Plan: Fluticasone-Salmeterol (ADVAIR DISKUS) 500-50 MCG/DOSE AEPB, predniSONE (DELTASONE) 10 MG tablet, methylPREDNISolone acetate (DEPO-MEDROL) injection 40 mg, levofloxacin (LEVAQUIN) 500 MG tablet, chlorpheniramine-HYDROcodone (TUSSIONEX PENNKINETIC ER) 10-8 MG/5ML SUER, benzonatate (TESSALON) 100 MG capsule  Here today with recurrent COPD exacerbation She has tried different treatment regimens but states that she usually requires IM steroids and po levaquin and steroids.  Also requests a refill of her cough syrup Will treat as below today, advised her to seek care if not better in the next couple of days  Meds ordered this encounter  Medications  .  Fluticasone-Salmeterol (ADVAIR DISKUS) 500-50 MCG/DOSE AEPB    Sig: Inhale 1 puff into the lungs 2 (two) times daily.    Dispense:  60 each    Refill:  0  . predniSONE (DELTASONE) 10 MG tablet    Sig: 7 tab po day 1, 6 tab po day 2, 5 tab po day 3, 4 tab po day 4, 3 tab pod day 5, 2 tab po day 6, 1 tab po day 7.    Dispense:  28 tablet    Refill:  0  . methylPREDNISolone acetate (DEPO-MEDROL) injection 40 mg  . levofloxacin (LEVAQUIN) 500 MG tablet    Sig: Take 1 tablet (  500 mg total) by mouth daily.    Dispense:  7 tablet    Refill:  0  . chlorpheniramine-HYDROcodone (TUSSIONEX PENNKINETIC ER) 10-8 MG/5ML SUER    Sig: Take 5 mLs by mouth at bedtime as needed.    Dispense:  90 mL    Refill:  0  . benzonatate (TESSALON) 100 MG capsule    Sig: Take 1 capsule (100 mg total) by mouth 3 (three) times daily as needed for cough.    Dispense:  60 capsule    Refill:  0     Signed Abbe Amsterdam, MD

## 2016-09-22 NOTE — Patient Instructions (Addendum)
We will treat you as usual for your COPD exacerbation with a shot of steroids, oral steroids, oral antibiotic (levaquin), and tesslon perles for cough, tussionex cough syrup for night time symptoms as needed Continue to use your albuterol as needed.  I would also suggest that you go back on your advair for the next few weeks at least If you are not improving in 1-2 days as usual please seek care- Sooner if worse.

## 2016-09-22 NOTE — Progress Notes (Signed)
Pre visit review using our clinic review tool, if applicable. No additional management support is needed unless otherwise documented below in the visit note. 

## 2016-10-17 ENCOUNTER — Ambulatory Visit (INDEPENDENT_AMBULATORY_CARE_PROVIDER_SITE_OTHER): Payer: BLUE CROSS/BLUE SHIELD | Admitting: Internal Medicine

## 2016-10-17 ENCOUNTER — Telehealth: Payer: Self-pay | Admitting: Internal Medicine

## 2016-10-17 ENCOUNTER — Encounter: Payer: Self-pay | Admitting: Internal Medicine

## 2016-10-17 VITALS — BP 140/72 | HR 83 | Ht 62.0 in | Wt 152.0 lb

## 2016-10-17 DIAGNOSIS — R05 Cough: Secondary | ICD-10-CM

## 2016-10-17 DIAGNOSIS — R0600 Dyspnea, unspecified: Secondary | ICD-10-CM

## 2016-10-17 DIAGNOSIS — R059 Cough, unspecified: Secondary | ICD-10-CM

## 2016-10-17 DIAGNOSIS — Z87891 Personal history of nicotine dependence: Secondary | ICD-10-CM

## 2016-10-17 DIAGNOSIS — R0689 Other abnormalities of breathing: Secondary | ICD-10-CM

## 2016-10-17 DIAGNOSIS — R062 Wheezing: Secondary | ICD-10-CM

## 2016-10-17 DIAGNOSIS — R0789 Other chest pain: Secondary | ICD-10-CM

## 2016-10-17 NOTE — Progress Notes (Addendum)
Subjective:    Patient ID: Morgan RampBonnie Lenz, female    DOB: [birth year], 60 y.o.   MRN: [id number]  HPI   OV 10/17/2016  Chief Complaint  Patient presents with  . Pulmonary Consult    Pt is a self referral for bronchitis. Pt states she has had bronchitis x 6 months. Pt c/o prod cough with yellow mucus, SOB with activity. Pt deneis CP/tightness and f/c/s.     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 She is here with her husband. Her mom had COPD. She is a 45 pack smoker who quit 6 months ago. He and her husband state that every additionally gets 2 episodes of acute bronchitis treated with antibiotics and prednisone with interim periods of wellness. She had one similar episode in June 2017 but since then has had chronic cough associated with chest tightness, wheezing and shortness of breath that is exertional and relieved by rest. Symptoms are episodic and constant. She does have nocturnal awakenings at least 2 times a week. Symptoms are partially relieved by albuterol and also by rest. She only tried Advair briefly by the primary care physician but she feels she cannot get enough time for this to Show efect. She also gives a history of frequent prescriptions of prednisone and antibiotics at least 4-6 this year. Most recent prednisone was a few days ago [ending a few days ago]. The prescriptions of prednisone and antibiotic helped her immensely within the symptoms just recovered. He is not on any other maintenance inhaler at this point. She is frustrated by symptoms. She gets dyspneic climbing a flight of stairs and coming back    Blood work 04/14/2016 shows normal creatinine, normal troponin and hemoglobin 10.7 g percent. At 0% eosinophils.  Echo 04/13/16 - ef 65%, Mild MR  cxr 9/11/7  - clear ; Personally visualized    has a past medical history of Anemia (01/13/2016); Chronic bronchitis (HCC); Depression (09/24/2015); Diarrhea (04/29/2016); GERD (gastroesophageal reflux  disease); H/O measles; H/O mumps; Hiatal hernia; History of chicken pox; Hyperlipidemia, mixed (09/24/2015); Hypertension; and Insomnia.   reports that she quit smoking about 6 months ago. Her smoking use included Cigarettes. She has a 45.00 pack-year smoking history. She has never used smokeless tobacco.  Past Surgical History:  Procedure Laterality Date  . TONSILLECTOMY      No Known Allergies  Immunization History  Administered Date(s) Administered  . Influenza Split 10/02/2011  . Influenza Whole 07/28/2008, 07/29/2014  . Influenza, Seasonal, Injecte, Preservative Fre 07/13/2015  . Td 01/17/2010  . Zoster 09/15/2015    Family History  Problem Relation Age of Onset  . Hypertension Mother   . Allergies Mother   . Asthma Mother   . COPD Mother     previous smoker  . Diabetes Sister   . Alzheimer's disease Maternal Grandfather   . Heart disease Neg Hx      Current Outpatient Prescriptions:  .  albuterol (PROVENTIL HFA;VENTOLIN HFA) 108 (90 Base) MCG/ACT inhaler, Inhale 1-2 puffs into the lungs every 6 (six) hours as needed for wheezing or shortness of breath., Disp: , Rfl:  .  amLODipine (NORVASC) 10 MG tablet, Take 1 tablet (10 mg total) by mouth daily., Disp: 30 tablet, Rfl: 0 .  benzonatate (TESSALON) 100 MG capsule, Take 1 capsule (100 mg total) by mouth 3 (three) times daily as needed for cough., Disp: 60 capsule, Rfl: 0 .  sertraline (ZOLOFT) 100 MG tablet, Take 1 tablet (100 mg total) by mouth  daily., Disp: 90 tablet, Rfl: 1 .  Spacer/Aero-Holding Chambers (AEROCHAMBER PLUS) inhaler, Use as instructed, Disp: 1 each, Rfl: 2  Current Facility-Administered Medications:  .  ipratropium-albuterol (DUONEB) 0.5-2.5 (3) MG/3ML nebulizer solution 3 mL, 3 mL, Nebulization, Q6H, Waldon Merl, PA-C, 3 mL at 06/27/16 1416 .  methylPREDNISolone acetate (DEPO-MEDROL) injection 40 mg, 40 mg, Intramuscular, Once, Bradd Canary, MD   Review of Systems  Constitutional: Negative  for fever and unexpected weight change.  HENT: Positive for congestion. Negative for dental problem, ear pain, nosebleeds, postnasal drip, rhinorrhea, sinus pressure, sneezing, sore throat and trouble swallowing.   Eyes: Negative for redness and itching.  Respiratory: Positive for cough and shortness of breath. Negative for chest tightness and wheezing.   Cardiovascular: Negative for palpitations and leg swelling.  Gastrointestinal: Negative for nausea and vomiting.  Genitourinary: Negative for dysuria.  Musculoskeletal: Negative for joint swelling.  Skin: Negative for rash.  Neurological: Negative for headaches.  Hematological: Does not bruise/bleed easily.  Psychiatric/Behavioral: Negative for dysphoric mood. The patient is not nervous/anxious.        Objective:   Physical Exam  Constitutional: She is oriented to person, place, and time. She appears well-developed and well-nourished. No distress.  HENT:  Head: Normocephalic and atraumatic.  Right Ear: External ear normal.  Left Ear: External ear normal.  Mouth/Throat: Oropharynx is clear and moist. No oropharyngeal exudate.  Eyes: Conjunctivae and EOM are normal. Pupils are equal, round, and reactive to light. Right eye exhibits no discharge. Left eye exhibits no discharge. No scleral icterus.  Neck: Normal range of motion. Neck supple. No JVD present. No tracheal deviation present. No thyromegaly present.  Cardiovascular: Normal rate, regular rhythm, normal heart sounds and intact distal pulses.  Exam reveals no gallop and no friction rub.   No murmur heard. Pulmonary/Chest: Effort normal and breath sounds normal. No respiratory distress. She has no wheezes. She has no rales. She exhibits no tenderness.  ? Occasional wheeze  Abdominal: Soft. Bowel sounds are normal. She exhibits no distension and no mass. There is no tenderness. There is no rebound and no guarding.  Musculoskeletal: Normal range of motion. She exhibits no edema or  tenderness.  Lymphadenopathy:    She has no cervical adenopathy.  Neurological: She is alert and oriented to person, place, and time. She has normal reflexes. No cranial nerve deficit. She exhibits normal muscle tone. Coordination normal.  Skin: Skin is warm and dry. No rash noted. She is not diaphoretic. No erythema. No pallor.  Psychiatric: She has a normal mood and affect. Her behavior is normal. Judgment and thought content normal.  Vitals reviewed.   Vitals:   10/17/16 1343  BP: 140/72  Pulse: 83  SpO2: 97%  Weight: 152 lb (68.9 kg)  Height: 5\' 2"  (1.575 m)         Assessment & Plan:     ICD-9-CM ICD-10-CM   1. Cough 786.2 R05   2. Wheeze 786.07 R06.2   3. Chest tightness 786.59 R07.89   4. Dyspnea and respiratory abnormality 786.09 R06.00     R06.89   5. History of smoking 30 or more pack years V15.82 Z87.891     Concern is COPD v asthma  PLAN STrt TRELEGY sample inhalers - take 1 month worth and learn technique and schedule  - take daily Use albuterol as needed Full PFT next 1-2 weeks at any location  followup  - after PFT   - if PFT confirms copd -then further treatment  adjustment and alpha 1 check and rehab referral will be discussed    Dr. Kalman ShanMurali Ramaswamy, M.D., [facility] Pulmonary and Critical Care Medicine Staff Physician [facility] [place] [facility] Pager: [phone], If no answer or between  15:00h - 7:00h: call [phone]  10/17/2016 2:19 PM

## 2016-10-17 NOTE — Patient Instructions (Addendum)
ICD-9-CM ICD-10-CM   1. Cough 786.2 R05   2. Wheeze 786.07 R06.2   3. Chest tightness 786.59 R07.89   4. Dyspnea and respiratory abnormality 786.09 R06.00     R06.89   5. History of smoking 30 or more pack years V15.82 Z87.891    Concern is COPD v asthma  PLAN STrt TRELEGY sample inhalers - take 1 month worth and learn technique and schedule  - take daily Use albuterol as needed Full PFT next 1-2 weeks at any location  followup  - after PFT   - if PFT confirms copd -then further treatment adjustment and alpha 1 check and rehab referral will be discussed

## 2016-10-17 NOTE — Telephone Encounter (Signed)
MR please advise if this pt can be worked in or can she see a NP? Thanks.

## 2016-10-18 ENCOUNTER — Ambulatory Visit (HOSPITAL_COMMUNITY)
Admission: RE | Admit: 2016-10-18 | Discharge: 2016-10-18 | Disposition: A | Discharge status: Home / Self Care | Payer: BLUE CROSS/BLUE SHIELD | Source: Ambulatory Visit | Attending: Internal Medicine | Admitting: Internal Medicine

## 2016-10-18 DIAGNOSIS — R0600 Dyspnea, unspecified: Secondary | ICD-10-CM | POA: Diagnosis not present

## 2016-10-18 DIAGNOSIS — R05 Cough: Secondary | ICD-10-CM | POA: Insufficient documentation

## 2016-10-18 DIAGNOSIS — R059 Cough, unspecified: Secondary | ICD-10-CM

## 2016-10-18 DIAGNOSIS — R062 Wheezing: Secondary | ICD-10-CM | POA: Diagnosis present

## 2016-10-18 DIAGNOSIS — R0689 Other abnormalities of breathing: Secondary | ICD-10-CM | POA: Insufficient documentation

## 2016-10-18 LAB — PULMONARY FUNCTION TEST
DL/VA % PRED: 83 %
DL/VA: 3.69 ml/min/mmHg/L
DLCO UNC % PRED: 60 %
DLCO UNC: 12.2 ml/min/mmHg
FEF 25-75 POST: 0.63 L/s
FEF 25-75 PRE: 0.53 L/s
FEF2575-%Change-Post: 19 %
FEF2575-%PRED-PRE: 25 %
FEF2575-%Pred-Post: 29 %
FEV1-%Change-Post: 1 %
FEV1-%PRED-POST: 54 %
FEV1-%Pred-Pre: 53 %
FEV1-PRE: 1.19 L
FEV1-Post: 1.21 L
FEV1FVC-%Change-Post: 0 %
FEV1FVC-%PRED-PRE: 75 %
FEV6-%CHANGE-POST: 4 %
FEV6-%Pred-Post: 73 %
FEV6-%Pred-Pre: 70 %
FEV6-POST: 2.04 L
FEV6-Pre: 1.96 L
FEV6FVC-%Change-Post: 2 %
FEV6FVC-%PRED-POST: 103 %
FEV6FVC-%Pred-Pre: 101 %
FVC-%Change-Post: 1 %
FVC-%PRED-PRE: 69 %
FVC-%Pred-Post: 71 %
FVC-POST: 2.05 L
FVC-PRE: 2.02 L
PRE FEV1/FVC RATIO: 59 %
Post FEV1/FVC ratio: 59 %
Post FEV6/FVC ratio: 99 %
Pre FEV6/FVC Ratio: 97 %
RV % pred: 109 %
RV: 2.05 L
TLC % PRED: 96 %
TLC: 4.44 L

## 2016-10-18 MED ORDER — ALBUTEROL SULFATE (2.5 MG/3ML) 0.083% IN NEBU
2.5000 mg | INHALATION_SOLUTION | Freq: Once | RESPIRATORY_TRACT | Status: AC
  Administered 2016-10-18: 2.5 mg via RESPIRATORY_TRACT

## 2016-10-18 NOTE — Telephone Encounter (Signed)
lmtcb for pt. Pt just needs OV with NP after her PFT.

## 2016-10-18 NOTE — Telephone Encounter (Signed)
She can see NP  Dr. Kalman ShanMurali Bennette Hasty, M.D., [facility] Pulmonary and Critical Care Medicine Staff Physician [facility] [place] [facility] Critical [facility] Pager: [phone], If no answer or between  15:00h - 7:00h: call [phone]  10/18/2016 10:36 AM

## 2016-10-19 NOTE — Telephone Encounter (Signed)
lmtcb

## 2016-10-19 NOTE — Telephone Encounter (Signed)
Patient returned phone call contact # [phone].Morgan Harper.Charm RingsErica R Taylor

## 2016-10-19 NOTE — Telephone Encounter (Signed)
Spoke with pt. She is aware of MR's response. Pt has been scheduled to see SG on 11/02/15 at 11:00am. Nothing further was needed.

## 2016-10-19 NOTE — Telephone Encounter (Signed)
PFT shows gold stage 2 copd  pls gie her fu with TP/SG - let me know who she sees and I can give them instructions on alpha  1 check and copd study referal  Dr. Kalman ShanMurali Kamaiya Antilla, M.D., [facility].P Pulmonary and Critical Care Medicine Staff Physician [facility] [place] [facility] Critical [facility] Pager: [phone], If no answer or between  15:00h - 7:00h: call [phone]  10/19/2016 12:02 AM

## 2016-10-24 ENCOUNTER — Telehealth: Payer: Self-pay | Admitting: Internal Medicine

## 2016-10-24 NOTE — Telephone Encounter (Signed)
Morgan SalinesSarah  You wil be seeing Ronnald RampBonnie Molchan 1956/03/16 on 11/01/16 for review of PFT results. It shows Gold stage 2 copd. She was never on any inhalers scheduled in past. I sent her on TRELEGY samples.   PLAN 1. Please check if she is doing better on trelegy. She probably doe snot need triple mdi for gold stage 2 copd . You can do a LABA/:LAMA combo or if she prefers you can just leave her on triple mdi. She is unsinured I think and so samples/a cost effect plan is needed  2. Please check alpha 1  3. Please talk to her about lung ca screning at visit 14/18 or arrange for it for future  4. But definitely, please call the research team [phone] to introduce patient to danrixin study (I have copied [name]) and give copy of ICF  Thanks and no need to reply back   Dr. Kalman ShanMurali Katilynn Sinkler, M.D., [facility].C.P Pulmonary and Critical Care Medicine Staff Physician [facility] [place] [facility] and [facility] Pager: [phone], If no answer or between  15:00h - 7:00h: call [phone]  10/24/2016 12:23 AM  'g  Results for Ronnald RampSHEARIN, Na (MRN [id number]) as of 10/24/2016 00:18  Ref. Range 10/18/2016 10:46  FEV1-Post Latest Units: L 1.21  FEV1-%Pred-Post Latest Units: % 54  FEV1-%Change-Post Latest Units: % 1  Post FEV1/FVC ratio Latest Units: % 59  Results for Ronnald RampSHEARIN, Quynn (MRN [id number]) as of 10/24/2016 00:18  Ref. Range 10/18/2016 10:46  DLCO unc Latest Units: ml/min/mmHg 12.20  DLCO unc % pred Latest Units: % 60

## 2016-11-01 ENCOUNTER — Ambulatory Visit (INDEPENDENT_AMBULATORY_CARE_PROVIDER_SITE_OTHER): Payer: BLUE CROSS/BLUE SHIELD | Admitting: Acute Care

## 2016-11-01 ENCOUNTER — Encounter: Payer: Self-pay | Admitting: Acute Care

## 2016-11-01 ENCOUNTER — Other Ambulatory Visit: Payer: BLUE CROSS/BLUE SHIELD

## 2016-11-01 VITALS — BP 124/80 | HR 80 | Ht 62.0 in | Wt 154.6 lb

## 2016-11-01 DIAGNOSIS — J209 Acute bronchitis, unspecified: Secondary | ICD-10-CM

## 2016-11-01 DIAGNOSIS — J441 Chronic obstructive pulmonary disease with (acute) exacerbation: Secondary | ICD-10-CM | POA: Diagnosis not present

## 2016-11-01 DIAGNOSIS — Z720 Tobacco use: Secondary | ICD-10-CM | POA: Diagnosis not present

## 2016-11-01 MED ORDER — PREDNISONE 10 MG PO TABS: ORAL_TABLET | ORAL | 0 refills | Status: DC

## 2016-11-01 MED ORDER — LEVOFLOXACIN 500 MG PO TABS: 500.0000 mg | ORAL_TABLET | Freq: Every day | ORAL | 0 refills | Status: DC

## 2016-11-01 MED ORDER — HYDROCODONE-HOMATROPINE 5-1.5 MG/5ML PO SYRP: 5.0000 mL | ORAL_SOLUTION | Freq: Every evening | ORAL | 0 refills | Status: DC | PRN

## 2016-11-01 NOTE — Assessment & Plan Note (Addendum)
Gold Stage II per PFT's 09/2016 Plan: We will draw an Alpha 1 Lab today. We will continue the Trelegy inhaler once daily. Rinse mouth after use. Please call us if Insurance coverage is not good, so we can look at other inhaler options. We will have the research co-ordinator for [employer] come and speak with you about the Danrixin Study. Follow up with Dr. Marchelle Gearingamaswamy or me in 1 month. Please contact office for sooner follow up if symptoms do not improve or worsen or seek emergency care

## 2016-11-01 NOTE — Assessment & Plan Note (Signed)
Slow to resolve bronchitis Cough worse Plan We will repeat Levaquin 500 mg x 7 days . Take Align Probiotic while on antibiotics. Prednisone taper; 10 mg tablets: 4 tabs x 2 days, 3 tabs x 2 days, 2 tabs x 2 days 1 tab x 2 days then stop.Marland Kitchen. Hydromet 5 cc's at bedtime for cough Don't drive if sleepy. Follow up with Dr. Marchelle Gearingamaswamy or me in 1 month. Please contact office for sooner follow up if symptoms do not improve or worsen or seek emergency care

## 2016-11-01 NOTE — Progress Notes (Signed)
History of Present Illness Morgan Harper is a 61 y.o. female former smoker ( Quit 03/2016) with a 45 pack year smoking history with newly diagnosed COPD Gold Stage II. She is a former smoker, quit 03/2016. She is followed by Dr. Marchelle Gearing.   11/01/2016 Follow Up New Trelegy  use after recent diagnosis with COPD Gold II Pt is here for follow up after initiating use of Trelegy inhaler for newly diagnosed COPD Gold Stage II.She states she has had relief of her shortness of breath, but she still has cough with yellow secretions, thick. She denies fever, chest pain, orthopnea, and hemoptysis.She states she is not sleeping well at night, but is having what she describes as night sweats x the last 2 weeks.( ? Fever). Overall she likes the inhaler, feels it is helping with her shortness of breath. She is concerned that she is still coughing with productive yellow secretions. We reviewed PFT's, discussed the need to check Alpha 1 today, She wants referral for lung cancer screening, and would like to speak with the research co-ordinator about the Danrixin study.She denies fever, orthopnea, chest pain, or hemoptysis. She wants to remain on the Trelegy inhaler , she will assess cost through her insurance carrier and let us know if she needs to switch to another LABA/LAMA combo due to cost.[name] from [employer] is speaking with patient now about participation in Danrixin Study.    Tests: PFT's 112/21/2017  FVC :2.02/ 69% predicted FEV1 : 1.19/ 53% predicted F/F ratio 59/ 75% predicted TLC 4.44/ 96% predicted DLCO: 12.20/ 60% predicted Interpretation: Moderately severe Obstructive Airways Disease Moderate Diffusion Deficit Default HGB value of 13.4 was used. Last documented  HGB was 10.7 on 04/14/2016   Past medical hx Past Medical History:  Diagnosis Date  . Anemia 01/13/2016  . Chronic bronchitis (HCC)   . Depression 09/24/2015  . Diarrhea 04/29/2016  . GERD (gastroesophageal reflux disease)   . H/O  measles   . H/O mumps   . Hiatal hernia   . History of chicken pox   . Hyperlipidemia, mixed 09/24/2015  . Hypertension   . Insomnia      Past surgical hx, Family hx, Social hx all reviewed.  Current Outpatient Prescriptions on File Prior to Visit  Medication Sig  . albuterol (PROVENTIL HFA;VENTOLIN HFA) 108 (90 Base) MCG/ACT inhaler Inhale 1-2 puffs into the lungs every 6 (six) hours as needed for wheezing or shortness of breath.  Marland Kitchen amLODipine (NORVASC) 10 MG tablet Take 1 tablet (10 mg total) by mouth daily.  . benzonatate (TESSALON) 100 MG capsule Take 1 capsule (100 mg total) by mouth 3 (three) times daily as needed for cough.  . sertraline (ZOLOFT) 100 MG tablet Take 1 tablet (100 mg total) by mouth daily.  Marland Kitchen Spacer/Aero-Holding Chambers (AEROCHAMBER PLUS) inhaler Use as instructed   Current Facility-Administered Medications on File Prior to Visit  Medication  . ipratropium-albuterol (DUONEB) 0.5-2.5 (3) MG/3ML nebulizer solution 3 mL  . methylPREDNISolone acetate (DEPO-MEDROL) injection 40 mg     No Known Allergies  Review Of Systems:  Constitutional:   No  weight loss, night sweats,  Fevers, chills, fatigue, or  lassitude.  HEENT:   No headaches,  Difficulty swallowing,  Tooth/dental problems, or  Sore throat,                No sneezing, itching, ear ache, nasal congestion,+ post nasal drip,   CV:  No chest pain,  Orthopnea, PND, swelling in lower extremities, anasarca, dizziness, palpitations,  syncope.   GI  No heartburn, indigestion, abdominal pain, nausea, vomiting, diarrhea, change in bowel habits, loss of appetite, bloody stools.   Resp: + shortness of breath with exertion not  at rest.  + excess mucus, + productive cough,  No non-productive cough,  No coughing up of blood.  + change in color of mucus.  No wheezing.  No chest wall deformity  Skin: no rash or lesions.  GU: no dysuria, change in color of urine, no urgency or frequency.  No flank pain, no hematuria     MS:  No joint pain or swelling.  No decreased range of motion.  No back pain.  Psych:  No change in mood or affect. No depression or anxiety.  No memory loss.   Vital Signs BP 124/80 (BP Location: Left Arm, Patient Position: Sitting, Cuff Size: Normal)   Pulse 80   Ht 5\' 2"  (1.575 m)   Wt 154 lb 9.6 oz (70.1 kg)   SpO2 95%   BMI 28.28 kg/m    Physical Exam:  General- No distress,  A&Ox3, pleasant ENT: No sinus tenderness, TM clear, pale nasal mucosa, no oral exudate,+ post nasal drip, no LAN Cardiac: S1, S2, regular rate and rhythm, no murmur Chest: No wheeze/ rales/ dullness; no accessory muscle use, no nasal flaring, no sternal retractions Abd.: Soft Non-tender Ext: No clubbing cyanosis, edema Neuro:  normal strength Skin: No rashes, warm and dry Psych: normal mood and behavior   Assessment/Plan  COPD exacerbation (HCC) Gold Stage II per PFT's 09/2016 Plan: We will draw an Alpha 1 Lab today. We will continue the Trelegy inhaler once daily. Rinse mouth after use. Please call us if Insurance coverage is not good, so we can look at other inhaler options. We will have the research co-ordinator for [employer] come and speak with you about the Danrixin Study. Follow up with Dr. Marchelle Gearingamaswamy or me in 1 month. Please contact office for sooner follow up if symptoms do not improve or worsen or seek emergency care     Acute bronchitis Slow to resolve bronchitis Cough worse Plan We will repeat Levaquin 500 mg x 7 days . Take Align Probiotic while on antibiotics. Prednisone taper; 10 mg tablets: 4 tabs x 2 days, 3 tabs x 2 days, 2 tabs x 2 days 1 tab x 2 days then stop.Marland Kitchen. Hydromet 5 cc's at bedtime for cough Don't drive if sleepy. Follow up with Dr. Marchelle Gearingamaswamy or me in 1 month. Please contact office for sooner follow up if symptoms do not improve or worsen or seek emergency care    Tobacco abuse Former smoker ( Quit 03/2016) with a 45 pack year smoking history with newly  diagnosed COPD Gold Stage II. Per PFT's Plan: Referral to Lung Cancer Screening Program    Bevelyn NgoSarah F Murial Beam, NP 11/01/2016  2:43 PM

## 2016-11-01 NOTE — Patient Instructions (Addendum)
We will refer you to the lung cancer screening program. We will draw an Alpha 1 Lab today. We will continue the Trelegy inhaler once daily. Rinse mouth after use. We will repeat Levaquin 500 mg x 7 days . Take Align Probiotic while on antibiotics. Prednisone taper; 10 mg tablets: 4 tabs x 2 days, 3 tabs x 2 days, 2 tabs x 2 days 1 tab x 2 days then stop.Marland Kitchen. Hydromet 5 cc's at bedtime for cough Don't drive if sleepy. Please call us if Insurance coverage is not good, so we can look at other inhaler options. We will have the research co-ordinator for [employer] come and speak with you about the Danrixin Study. Follow up with Dr. Marchelle Gearingamaswamy or me in 1 month. Please contact office for sooner follow up if symptoms do not improve or worsen or seek emergency care

## 2016-11-01 NOTE — Assessment & Plan Note (Signed)
Former smoker ( Quit 03/2016) with a 45 pack year smoking history with newly diagnosed COPD Gold Stage II. Per PFT's Plan: Referral to Lung Cancer Screening Program

## 2016-11-05 LAB — ALPHA-1 ANTITRYPSIN PHENOTYPE: A1 ANTITRYPSIN: 153 mg/dL (ref 83–199)

## 2016-11-08 ENCOUNTER — Telehealth: Payer: Self-pay | Admitting: Internal Medicine

## 2016-11-08 MED ORDER — FLUTICASONE-UMECLIDIN-VILANT 100-62.5-25 MCG/INH IN AEPB: 1.0000 | INHALATION_SPRAY | Freq: Every day | RESPIRATORY_TRACT | 0 refills | Status: AC

## 2016-11-08 NOTE — Telephone Encounter (Signed)
Two samples of trelegy have been placed up front for pick up. Pt aware and voiced her understanding.  Nothing further needed.

## 2016-11-13 ENCOUNTER — Ambulatory Visit (INDEPENDENT_AMBULATORY_CARE_PROVIDER_SITE_OTHER): Payer: BLUE CROSS/BLUE SHIELD

## 2016-11-13 ENCOUNTER — Telehealth: Payer: Self-pay | Admitting: Internal Medicine

## 2016-11-13 ENCOUNTER — Other Ambulatory Visit: Payer: Self-pay | Admitting: Family Medicine

## 2016-11-13 DIAGNOSIS — Z23 Encounter for immunization: Secondary | ICD-10-CM | POA: Diagnosis not present

## 2016-11-13 MED ORDER — SERTRALINE HCL 100 MG PO TABS: 100.0000 mg | ORAL_TABLET | Freq: Every day | ORAL | 1 refills | Status: DC

## 2016-11-13 NOTE — Telephone Encounter (Signed)
Attempted to call pt. To find out if she was aware around what time will she be coming to the office. Her phone went straight to vm. Left vm with question and to call us back.

## 2016-11-13 NOTE — Telephone Encounter (Signed)
Triage: I ran into patient Ronnald RampBonnie Wigfall jsut now. She is without wheeze -> please give her regular dose flu shot 11/13/2016 . I will ask her to come up from research office to front desk 11/13/2016 PM  Thanks  Dr. Kalman ShanMurali Anthonymichael Munday, M.D., F.C.C.P Pulmonary and Critical Care Medicine Staff Physician [facility] Pager: [phone], If no answer or between  15:00h - 7:00h: call [phone]  11/13/2016 3:17 PM

## 2016-11-13 NOTE — Telephone Encounter (Signed)
Routing to Genworth FinancialElise's box for follow-up as MR generated this message and it will not be followed up on through left messages.

## 2016-11-16 NOTE — Telephone Encounter (Signed)
Per pt chart, she received flu shot on 11/13/16.  Will close this encounter.

## 2016-11-21 MED FILL — BUPROPION SR 150 MG TABLET: 150 | 30 days supply | Qty: 60 | Fill #0

## 2016-11-22 ENCOUNTER — Telehealth: Payer: Self-pay | Admitting: *Deleted

## 2016-11-22 NOTE — Telephone Encounter (Signed)
This patient, Morgan Harper DOB 04-Dec-1955, is scheduled for Visit 0/visit 1 on 26Jan18 at 0800. However, we need to change the start time of the visit to 0700 am in order to complete the required procedures on the PFT machine located on the second floor in [facility]. This coordinator left a message for the patient to call the research office to discuss this time change.  Messages were left on 24Jan18, 25Jan18 at 0930, and 25Jan18 at 1435. Will await the patients return call in regards to the appointment.   [name], CMA Study Coordinator

## 2016-11-23 ENCOUNTER — Encounter: Payer: Self-pay | Admitting: Adult Health

## 2016-11-23 ENCOUNTER — Ambulatory Visit (INDEPENDENT_AMBULATORY_CARE_PROVIDER_SITE_OTHER): Payer: BLUE CROSS/BLUE SHIELD | Admitting: Internal Medicine

## 2016-11-23 ENCOUNTER — Telehealth: Payer: Self-pay | Admitting: Internal Medicine

## 2016-11-23 ENCOUNTER — Ambulatory Visit (INDEPENDENT_AMBULATORY_CARE_PROVIDER_SITE_OTHER): Payer: BLUE CROSS/BLUE SHIELD | Admitting: Adult Health

## 2016-11-23 VITALS — BP 132/82 | HR 77 | Temp 97.9°F | Resp 14 | Ht 62.0 in | Wt 155.9 lb

## 2016-11-23 DIAGNOSIS — Z006 Encounter for examination for normal comparison and control in clinical research program: Secondary | ICD-10-CM

## 2016-11-23 DIAGNOSIS — J441 Chronic obstructive pulmonary disease with (acute) exacerbation: Secondary | ICD-10-CM | POA: Diagnosis not present

## 2016-11-23 DIAGNOSIS — J449 Chronic obstructive pulmonary disease, unspecified: Secondary | ICD-10-CM | POA: Diagnosis not present

## 2016-11-23 LAB — PULMONARY FUNCTION TEST
FEF 25-75 POST: 0.64 L/s
FEF 25-75 Pre: 0.59 L/sec
FEF2575-%Change-Post: 7 %
FEF2575-%Pred-Post: 30 %
FEF2575-%Pred-Pre: 28 %
FEV1-%CHANGE-POST: 3 %
FEV1-%Pred-Post: 56 %
FEV1-%Pred-Pre: 54 %
FEV1-Post: 1.25 L
FEV1-Pre: 1.21 L
FEV1FVC-%Change-Post: -1 %
FEV1FVC-%PRED-PRE: 75 %
FEV6-%Change-Post: 4 %
FEV6-%PRED-PRE: 72 %
FEV6-%Pred-Post: 75 %
FEV6-POST: 2.11 L
FEV6-PRE: 2.01 L
FEV6FVC-%Change-Post: 0 %
FEV6FVC-%PRED-POST: 102 %
FEV6FVC-%PRED-PRE: 102 %
FVC-%CHANGE-POST: 5 %
FVC-%PRED-POST: 74 %
FVC-%PRED-PRE: 70 %
FVC-POST: 2.15 L
FVC-PRE: 2.04 L
POST FEV6/FVC RATIO: 98 %
PRE FEV6/FVC RATIO: 98 %
Post FEV1/FVC ratio: 58 %
Pre FEV1/FVC ratio: 59 %

## 2016-11-23 NOTE — Patient Instructions (Signed)
Follow up per protocol  

## 2016-11-23 NOTE — Telephone Encounter (Signed)
Triage  Please call Morgan Harper . She has a blood draw in he wrist - venous . She now ssent me a picture - of hematoma around 3 cm size. Please tell her to keep pressure on it for 10 min with  A cloth and keep an eye over the weekend. Should settle down slowly with discoloration . If is getting worse ER tonight  Dr. Kalman ShanMurali Clarke Amburn, M.D., [facility] CenterF.C.C.P Pulmonary and Critical Care Medicine Staff Physician [facility] Pager: [phone], If no answer or between  15:00h - 7:00h: call [phone]  11/23/2016 4:32 PM

## 2016-11-23 NOTE — Assessment & Plan Note (Signed)
Recent flare now resolved  Cont on current regimen   

## 2016-11-23 NOTE — Telephone Encounter (Signed)
Spoke with pt, she was given instructions from MR. Pt verbalized understanding and nothing further is needed.

## 2016-11-23 NOTE — Assessment & Plan Note (Signed)
Randomised, Double-Blind (Sponsor Open), Placebo-Controlled, Multicentre, Dose Ranging Study to Evaluate the Efficacy and Safety of Danirixin Tablets Administered Twice Daily Compared With Placebo for 24 Weeks in Adults With COPD  Sponsor: [employer] , [employer] Number: [id number], NCT Trial #: [id number]  Synopsis:  Following baseline assessments collected over a 7 day period participants will be randomized (1:1:1:1:1:1) to receive one of five dose strengths of danirixin (5 milligram [mg], 10 mg, 25 mg, 35 mg and 50 mg) or placebo. Study treatment will be administered orally twice daily for 24 weeks. Participants will continue with their standard of care inhaled medications during study. Follow up will continue up to 28 days post last dose  Key Inclusion Criteria: age 340-80, COPD with fev1 >/= 40%, ratio <0.7, >/= 2 copd exacerbations in past 1 year treated with either antibiotics or steroids, but >/= 14 days since last antibiotic or steroid for exacerbation, >/= 10 pack smoking history  Key Exclusion Criteria: non-COPD lung disease, pulse ox < 88% at rest on room air (i.e, restting o2 patients excluded), active phase of pulmonary rehabilitation (maintenance ok), previous lung surgery, QTc prolongation, HIV positive etc..   Key end points: Changes in respiratory symptoms and copd exacerbations and safety of DNX  Key features of DNX:   selective CXC chemokine receptor (CXCR2) antagonist . Positive trends in COPD exacerbation, symptoms and health status   Screening exam completed per protocol  Labs pending

## 2016-11-23 NOTE — Progress Notes (Signed)
Title: Randomised, Double-Blind (Sponsor Open), Placebo-Controlled, Multicentre, Dose Ranging Study to Evaluate the Efficacy and Safety of Danirixin Tablets Administered Twice Daily Compared With Placebo for 24 Weeks in Adults With COPD  Sponsor: [employer] , [employer] Number: [id number], NCT Trial #: [id number]  Synopsis:  Following baseline assessments collected over a 7 day period participants will be randomized (1:1:1:1:1:1) to receive one of five dose strengths of danirixin (5 milligram [mg], 10 mg, 25 mg, 35 mg and 50 mg) or placebo. Study treatment will be administered orally twice daily for 24 weeks. Participants will continue with their standard of care inhaled medications during study. Follow up will continue up to 28 days post last dose  Key Inclusion Criteria: age 40-80, COPD with fev1 >/= 40%, ratio <0.7, >/= 2 copd exacerbations in past 1 year treated with either antibiotics or steroids, but >/= 14 days since last antibiotic or steroid for exacerbation, >/= 10 pack smoking history  Key Exclusion Criteria: non-COPD lung disease, pulse ox < 88% at rest on room air (i.e, restting o2 patients excluded), active phase of pulmonary rehabilitation (maintenance ok), previous lung surgery, QTc prolongation, HIV positive etc..   Key end points: Changes in respiratory symptoms and copd exacerbations and safety of DNX  Key features of DNX:   selective CXC chemokine receptor (CXCR2) antagonist . Positive trends in COPD exacerbation, symptoms and health status  Safety of DNX: As of April 2017 (5 completed Phase 1 studies, and 1 Phase 2A study of moderate copd patients with 45 patients):. Most side effects are seen at 100mg bid (current study max is 50mg bid)  Phase 1 Normal Adults - dose </= 50mg bid Study drug -    placebo  General All side effects mild - moderate. No changes in labs, EKG and vitals including PMN per PI meeting slide  x  Any adverse event 30% 13%  Headache 10-20% 0-17%   Fatigue/Lethargy 0-10% 0-3%  Abd Pain 0-11% (20% at 200mg bid) 0%   Cough 0% 0%  Study withdrawal 0% 0%  SAE 0% 0%   - 3. COPD  COPD patients -data from IB April 2017 and PI meeting Spring 2017 Study drug Placebo  Efficacy  AECOPD and CAT scores better with drug after 1 year x  General No change in PMN, EKG, Vitals, Labs, Spirometry but produces mild to moderate headache and diarrhea  x  Any AE 52-77% 55-80%  Cystitis 5% at 75mg bid 3%  Backpain 5% at 75mg bid 0%  Nasopharyngitis 29% at 75mg bid 33%  Headache 9% at 75mg bid 8%  Upper abdominal pain 0% at 75mg bid 6%  Infection rate 42% 50%  SAE 10 events 31 events   ........................................................................................................................................................................ 

## 2016-11-23 NOTE — Progress Notes (Signed)
@Patient  ID: Morgan Harper, female    DOB: 13-Jul-1956, 61 y.o.   MRN: [id number]  Chief Complaint  Patient presents with  . Research    Visit 0/Visit 1 [employer] [id number]    Referring provider: Bradd Canary, MD  HPI: 61 yo female former smoker followed for GOLD II COPD   11/23/2016 Research Study -Screening visit  Pt presents for screening study visit  .she says she is doing better today . She had a copd flare 3 weeks ago, tx w/ abx and steroids which she has been off of for 2 weeks.  Has some lingering dry cough and drainage.  She is on Trellegy inhaler .  We reviewed the study and she is aware this a voluntary study .  Screening PFT was reviewed , EKG w/no prolonged QTc noted and Chest xray was done w/in the last year in Sept 2017 w/ clear lungs noted.  Labs are pending    Randomised, Double-Blind (Sponsor Open), Placebo-Controlled, Multicentre, Dose Ranging Study to Evaluate the Efficacy and Safety of Danirixin Tablets Administered Twice Daily Compared With Placebo for 24 Weeks in Adults With COPD  Sponsor: [employer] , [employer] Number: [id number], NCT Trial #: [id number]  Synopsis:  Following baseline assessments collected over a 7 day period participants will be randomized (1:1:1:1:1:1) to receive one of five dose strengths of danirixin (5 milligram [mg], 10 mg, 25 mg, 35 mg and 50 mg) or placebo. Study treatment will be administered orally twice daily for 24 weeks. Participants will continue with their standard of care inhaled medications during study. Follow up will continue up to 28 days post last dose  Key Inclusion Criteria: age 62-80, COPD with fev1 >/= 40%, ratio <0.7, >/= 2 copd exacerbations in past 1 year treated with either antibiotics or steroids, but >/= 14 days since last antibiotic or steroid for exacerbation, >/= 10 pack smoking history  Key Exclusion Criteria: non-COPD lung disease, pulse ox < 88% at rest on room air (i.e, restting o2 patients excluded),  active phase of pulmonary rehabilitation (maintenance ok), previous lung surgery, QTc prolongation, HIV positive etc..   Key end points: Changes in respiratory symptoms and copd exacerbations and safety of DNX  Key features of DNX:   selective CXC chemokine receptor (CXCR2) antagonist . Positive trends in COPD exacerbation, symptoms and health status   No Known Allergies  Immunization History  Administered Date(s) Administered  . Influenza Split 10/02/2011  . Influenza Whole 07/28/2008, 07/29/2014  . Influenza, Seasonal, Injecte, Preservative Fre 07/13/2015  . Influenza,inj,Quad PF,36+ Mos 11/13/2016  . Td 01/17/2010  . Zoster 09/15/2015    Past Medical History:  Diagnosis Date  . Anemia 01/13/2016  . Chronic bronchitis (HCC)   . Depression 09/24/2015  . Diarrhea 04/29/2016  . GERD (gastroesophageal reflux disease)   . H/O measles   . H/O mumps   . Hiatal hernia   . History of chicken pox   . Hyperlipidemia, mixed 09/24/2015  . Hypertension   . Insomnia     Tobacco History: History  Smoking Status  . Former Smoker  . Packs/day: 1.00  . Years: 45.00  . Types: Cigarettes  . Quit date: 03/29/2016  Smokeless Tobacco  . Never Used   Counseling given: Not Answered   Outpatient Encounter Prescriptions as of 11/23/2016  Medication Sig  . albuterol (PROVENTIL HFA;VENTOLIN HFA) 108 (90 Base) MCG/ACT inhaler Inhale 1-2 puffs into the lungs every 6 (six) hours as needed for wheezing or shortness of breath.  Marland Kitchen amLODipine (NORVASC)  10 MG tablet Take 1 tablet (10 mg total) by mouth daily.  . benzonatate (TESSALON) 100 MG capsule Take 1 capsule (100 mg total) by mouth 3 (three) times daily as needed for cough.  . Fluticasone-Umeclidin-Vilant (TRELEGY ELLIPTA IN) Inhale into the lungs.  Marland Kitchen HYDROcodone-homatropine (HYCODAN) 5-1.5 MG/5ML syrup Take 5 mLs by mouth at bedtime as needed for cough.  Marland Kitchen levofloxacin (LEVAQUIN) 500 MG tablet Take 1 tablet (500 mg total) by mouth daily.  .  predniSONE (DELTASONE) 10 MG tablet 4 tabs for 2 days, 3 tabs for 2 days, 2 tabs for 2 days 1 tab for 2 days then stop  . sertraline (ZOLOFT) 100 MG tablet Take 1 tablet (100 mg total) by mouth daily.  Marland Kitchen Spacer/Aero-Holding Chambers (AEROCHAMBER PLUS) inhaler Use as instructed   Facility-Administered Encounter Medications as of 11/23/2016  Medication  . ipratropium-albuterol (DUONEB) 0.5-2.5 (3) MG/3ML nebulizer solution 3 mL  . methylPREDNISolone acetate (DEPO-MEDROL) injection 40 mg       Physical Exam  BP 132/82 (BP Location: Left Arm, Patient Position: Sitting, Cuff Size: Normal)   Pulse 77   Temp 97.9 F (36.6 C) (Oral)   Resp 14   Ht 5\' 2"  (1.575 m)   Wt 155 lb 13.8 oz (70.7 kg)   SpO2 96%   BMI 28.51 kg/m   GEN: A/Ox3; pleasant , NAD, well nourished    HEENT:  [place]/AT,  EACs-clear, TMs-wnl, NOSE-clear, THROAT-clear, no lesions, no postnasal drip or exudate noted.   NECK:  Supple w/ fair ROM; no JVD; normal carotid impulses w/o bruits; no thyromegaly or nodules palpated; no lymphadenopathy.    RESP decreased BS in bases  w/o, wheezes/ rales/ or rhonchi. no accessory muscle use, no dullness to percussion  CARD:  RRR, no m/r/g, no peripheral edema, pulses intact, no cyanosis or clubbing.  GI:   Soft & nt; nml bowel sounds; no organomegaly or masses detected.   Musco: Warm bil, no deformities or joint swelling noted.   Neuro: alert, no focal deficits noted.    Skin: Warm, no lesions or rashes  Psych:  No change in mood or affect. No depression or anxiety.  No memory loss.   Assessment & Plan:   COPD exacerbation (HCC) Recent flare now resolved Cont on current regimen    Research study patient Randomised, Double-Blind (Sponsor Open), Placebo-Controlled, Multicentre, Dose Ranging Study to Evaluate the Efficacy and Safety of Danirixin Tablets Administered Twice Daily Compared With Placebo for 24 Weeks in Adults With COPD  Sponsor: [employer] , [employer]  Number: [id number], NCT Trial #: [id number]  Synopsis:  Following baseline assessments collected over a 7 day period participants will be randomized (1:1:1:1:1:1) to receive one of five dose strengths of danirixin (5 milligram [mg], 10 mg, 25 mg, 35 mg and 50 mg) or placebo. Study treatment will be administered orally twice daily for 24 weeks. Participants will continue with their standard of care inhaled medications during study. Follow up will continue up to 28 days post last dose  Key Inclusion Criteria: age 58-80, COPD with fev1 >/= 40%, ratio <0.7, >/= 2 copd exacerbations in past 1 year treated with either antibiotics or steroids, but >/= 14 days since last antibiotic or steroid for exacerbation, >/= 10 pack smoking history  Key Exclusion Criteria: non-COPD lung disease, pulse ox < 88% at rest on room air (i.e, restting o2 patients excluded), active phase of pulmonary rehabilitation (maintenance ok), previous lung surgery, QTc prolongation, HIV positive etc..   Key end points: Changes in  respiratory symptoms and copd exacerbations and safety of DNX  Key features of DNX:   selective CXC chemokine receptor (CXCR2) antagonist . Positive trends in COPD exacerbation, symptoms and health status   Screening exam completed per protocol  Labs pending      [name], NP 11/23/2016

## 2016-11-23 NOTE — Progress Notes (Signed)
Title: Randomised, Double-Blind (Sponsor Open), Placebo-Controlled, Multicentre, Dose Ranging Study to Evaluate the Efficacy and Safety of Danirixin Tablets Administered Twice Daily Compared With Placebo for 24 Weeks in Adults With COPD  Sponsor: [employer] , Protoocol Number: [id number], NCT Trial #: [id number]  Synopsis:  Following baseline assessments collected over a 7 day period participants will be randomized (1:1:1:1:1:1) to receive one of five dose strengths of danirixin (5 milligram [mg], 10 mg, 25 mg, 35 mg and 50 mg) or placebo. Study treatment will be administered orally twice daily for 24 weeks. Participants will continue with their standard of care inhaled medications during study. Follow up will continue up to 28 days post last dose  Key Inclusion Criteria: age 97-80, COPD with fev1 >/= 40%, ratio <0.7, >/= 2 copd exacerbations in past 1 year treated with either antibiotics or steroids, but >/= 14 days since last antibiotic or steroid for exacerbation, >/= 10 pack smoking history  Key Exclusion Criteria: non-COPD lung disease, pulse ox < 88% at rest on room air (i.e, restting o2 patients excluded), active phase of pulmonary rehabilitation (maintenance ok), previous lung surgery, QTc prolongation, HIV positive etc..   Key end points: Changes in respiratory symptoms and copd exacerbations and safety of DNX  Key features of DNX:   selective CXC chemokine receptor (CXCR2) antagonist . Positive trends in COPD exacerbation, symptoms and health status  Safety of DNX: As of April 2017 (5 completed Phase 1 studies, and 1 Phase 2A study of moderate copd patients with 45 patients):Marland Kitchen Most side effects are seen at 100mg  bid (current study max is 50mg  bid)  Phase 1 Normal Adults - dose </= 50mg  bid Study drug -    placebo  General All side effects mild - moderate. No changes in labs, EKG and vitals including PMN per PI meeting slide  x  Any adverse event 30% 13%  Headache 10-20% 0-17%   Fatigue/Lethargy 0-10% 0-3%  Abd Pain 0-11% (20% at 200mg  bid) 0%   Cough 0% 0%  Study withdrawal 0% 0%  SAE 0% 0%   - 3. COPD  COPD patients -data from IB April 2017 and PI meeting Spring 2017 Study drug Placebo  Efficacy  AECOPD and CAT scores better with drug after 1 year x  General No change in PMN, EKG, Vitals, Labs, Spirometry but produces mild to moderate headache and diarrhea  x  Any AE 52-77% 55-80%  Cystitis 5% at 75mg  bid 3%  Backpain 5% at 75mg  bid 0%  Nasopharyngitis 29% at 75mg  bid 33%  Headache 9% at 75mg  bid 8%  Upper abdominal pain 0% at 75mg  bid 6%  Infection rate 42% 50%  SAE 10 events 31 events   ........................................................................................................................................................................  Clinical Research Coordinator / Research RN note : This visit for Subject Morgan Harper with DOB of  01/26/1956 on 11/23/2016 for the above protocol is Visit 0 Prescreen and Visit 1 Screening and is for purpose of reserach. The consent for this encounter is under Protocol Version Amendment 1 and is currently IRB approved. Subject expressed continued interest and consent in continuing as a study subject. Subject thanked for participation.  Subject was consented by Dr. Marchelle Gearing today to participate in the above named study. Refer to the [facility] informed consent process documentation checklist for full documentation of the consent process.  Subject mentioned having a cough this morning due to walking out in the cold the day prior and holding her Trelegy Ellipta Inhaler this morning per protocol. This coordinator advised the Sub-investigator of  this at the time of the physical exam. Refer to Sub-I exam note for further documentation.    All required procedures were completed according to protocol Amendment 1 requirements. Refer to the subjects paper source binder for detailed documentation of procedures  completed. Visit 2 has been scheduled for November 27, 2016.  In this visit 11/23/2016 the subject will be evaluated by investigator named [name], NP . This research coordinator has verified that the investigator is uptodate with her training logs.  Signed by Carron CurieJennifer Annely Sliva, CMA  Clinical Research Coordinator [facility]  [place], [place] 1:24 PM 11/23/2016

## 2016-11-27 DIAGNOSIS — Z006 Encounter for examination for normal comparison and control in clinical research program: Secondary | ICD-10-CM

## 2016-11-27 DIAGNOSIS — J42 Unspecified chronic bronchitis: Secondary | ICD-10-CM

## 2016-11-27 NOTE — Progress Notes (Signed)
Title: Randomised, Double-Blind (Sponsor Open), Placebo-Controlled, Multicentre, Dose Ranging Study to Evaluate the Efficacy and Safety of Danirixin Tablets Administered Twice Daily Compared With Placebo for 24 Weeks in Adults With COPD  Sponsor: [employer] , Protoocol Number: [id number], NCT Trial #: [id number]  Synopsis:  Following baseline assessments collected over a 7 day period participants will be randomized (1:1:1:1:1:1) to receive one of five dose strengths of danirixin (5 milligram [mg], 10 mg, 25 mg, 35 mg and 50 mg) or placebo. Study treatment will be administered orally twice daily for 24 weeks. Participants will continue with their standard of care inhaled medications during study. Follow up will continue up to 28 days post last dose  Key Inclusion Criteria: age 62-80, COPD with fev1 >/= 40%, ratio <0.7, >/= 2 copd exacerbations in past 1 year treated with either antibiotics or steroids, but >/= 14 days since last antibiotic or steroid for exacerbation, >/= 10 pack smoking history  Key Exclusion Criteria: non-COPD lung disease, pulse ox < 88% at rest on room air (i.e, restting o2 patients excluded), active phase of pulmonary rehabilitation (maintenance ok), previous lung surgery, QTc prolongation, HIV positive etc..   Key end points: Changes in respiratory symptoms and copd exacerbations and safety of DNX  Key features of DNX:   selective CXC chemokine receptor (CXCR2) antagonist . Positive trends in COPD exacerbation, symptoms and health status  Safety of DNX: As of April 2017 (5 completed Phase 1 studies, and 1 Phase 2A study of moderate copd patients with 45 patients):Marland Kitchen. Most side effects are seen at 100mg  bid (current study max is 50mg  bid)  Phase 1 Normal Adults - dose </= 50mg  bid Study drug -    placebo  General All side effects mild - moderate. No changes in labs, EKG and vitals including PMN per PI meeting slide  x  Any adverse event 30% 13%  Headache 10-20% 0-17%   Fatigue/Lethargy 0-10% 0-3%  Abd Pain 0-11% (20% at 200mg  bid) 0%   Cough 0% 0%  Study withdrawal 0% 0%  SAE 0% 0%   - 3. COPD  COPD patients -data from IB April 2017 and PI meeting Spring 2017 Study drug Placebo  Efficacy  AECOPD and CAT scores better with drug after 1 year x  General No change in PMN, EKG, Vitals, Labs, Spirometry but produces mild to moderate headache and diarrhea  x  Any AE 52-77% 55-80%  Cystitis 5% at 75mg  bid 3%  Backpain 5% at 75mg  bid 0%  Nasopharyngitis 29% at 75mg  bid 33%  Headache 9% at 75mg  bid 8%  Upper abdominal pain 0% at 75mg  bid 6%  Infection rate 42% 50%  SAE 10 events 31 events   ........................................................................................................................................................................ Clinical Research Coordinator / Research RN note : This visit for Subject Morgan Harper with [birth year] on 11/27/2016 for the above protocol is Visit #2 and is for purpose of Visit 2 procedures and re-consent. The consent for this encounter is under Protocol Version Amendment 1 and  Is currently IRB approved. Subject expressed continued interest and consent in continuing as a study subject. Subject thanked for participation.   This RN reviewed newly IRB approved consent form (revision 26Jan2018) with subject.  Subject verbalized understanding of changes in revised ICF from ICF first signed by subject.  Subject provided consent and was thanked for participation.  See paper documentation in subject's research chart for full documentation of the informed consent process.  [name], CMA to complete remainder of visit, see her documentation.  Signed by,  Kinnie Feil, RN Research Nurse Office [phone] [place], [place] 10:17 AM 11/27/2016

## 2016-11-27 NOTE — Progress Notes (Signed)
Clinical Research Coordinator / Research RN note : This visit for Subject Morgan Harper with DOB April 22, 1956 on 11/27/2016 for the above protocol is Visit 2  and is for purpose of research . The consent for this encounter is under Protocol Version Amendment 1 and is currently IRB approved. Subject expressed continued interest and consent in continuing as a study subject. Subject thanked for participation.   E-diary was assigned and training provided. MDI sensor was attached to the subjects rescue inhaler and training provided. Subject consented to participate in the activity monitor sub-study so Actigraph monitor was assigned and training provided. Refer to the subjects paper source binder for further documentation.   Signed by  [name], CMA Clinical Research Coordinator / Nurse [facility]  [place], [place] 11:23 AM 11/27/2016

## 2016-12-04 ENCOUNTER — Encounter: Payer: BLUE CROSS/BLUE SHIELD | Admitting: Internal Medicine

## 2016-12-04 ENCOUNTER — Encounter (INDEPENDENT_AMBULATORY_CARE_PROVIDER_SITE_OTHER): Payer: BLUE CROSS/BLUE SHIELD

## 2016-12-04 DIAGNOSIS — J449 Chronic obstructive pulmonary disease, unspecified: Secondary | ICD-10-CM

## 2016-12-04 DIAGNOSIS — Z006 Encounter for examination for normal comparison and control in clinical research program: Secondary | ICD-10-CM

## 2016-12-04 NOTE — Progress Notes (Signed)
Title: Randomised, Double-Blind (Sponsor Open), Placebo-Controlled, Multicentre, Dose Ranging Study to Evaluate the Efficacy and Safety of Danirixin Tablets Administered Twice Daily Compared With Placebo for 24 Weeks in Adults With COPD  Sponsor: [employer] , Protoocol Number: [id number], NCT Trial #: [id number]  Synopsis:  Following baseline assessments collected over a 7 day period participants will be randomized (1:1:1:1:1:1) to receive one of five dose strengths of danirixin (5 milligram [mg], 10 mg, 25 mg, 35 mg and 50 mg) or placebo. Study treatment will be administered orally twice daily for 24 weeks. Participants will continue with their standard of care inhaled medications during study. Follow up will continue up to 28 days post last dose  Key Inclusion Criteria: age 31-80, COPD with fev1 >/= 40%, ratio <0.7, >/= 2 copd exacerbations in past 1 year treated with either antibiotics or steroids, but >/= 14 days since last antibiotic or steroid for exacerbation, >/= 10 pack smoking history  Key Exclusion Criteria: non-COPD lung disease, pulse ox < 88% at rest on room air (i.e, restting o2 patients excluded), active phase of pulmonary rehabilitation (maintenance ok), previous lung surgery, QTc prolongation, HIV positive etc..   Key end points: Changes in respiratory symptoms and copd exacerbations and safety of DNX  Key features of DNX:   selective CXC chemokine receptor (CXCR2) antagonist . Positive trends in COPD exacerbation, symptoms and health status  Safety of DNX: As of April 2017 (5 completed Phase 1 studies, and 1 Phase 2A study of moderate copd patients with 45 patients):Marland Kitchen Most side effects are seen at 100mg  bid (current study max is 50mg  bid)  Phase 1 Normal Adults - dose </= 50mg  bid Study drug -    placebo  General All side effects mild - moderate. No changes in labs, EKG and vitals including PMN per PI meeting slide  x  Any adverse event 30% 13%  Headache 10-20% 0-17%   Fatigue/Lethargy 0-10% 0-3%  Abd Pain 0-11% (20% at 200mg  bid) 0%   Cough 0% 0%  Study withdrawal 0% 0%  SAE 0% 0%   - 3. COPD  COPD patients -data from IB April 2017 and PI meeting Spring 2017 Study drug Placebo  Efficacy  AECOPD and CAT scores better with drug after 1 year x  General No change in PMN, EKG, Vitals, Labs, Spirometry but produces mild to moderate headache and diarrhea  x  Any AE 52-77% 55-80%  Cystitis 5% at 75mg  bid 3%  Backpain 5% at 75mg  bid 0%  Nasopharyngitis 29% at 75mg  bid 33%  Headache 9% at 75mg  bid 8%  Upper abdominal pain 0% at 75mg  bid 6%  Infection rate 42% 50%  SAE 10 events 31 events   ........................................................................................................................................................................ Clinical Research Coordinator / Research RN note : This visit for Subject Morgan Harper with DOB 02/08/1956 on 12/04/2016 for the above protocol is Visit/Encounter # 3 and is for purpose of research. The consent for this encounter is under Protocol Version Amendment 1 and Is currently IRB approved. Subject expressed continued interest and consent in continuing as a study subject. Subject thanked for participation.   The subject was scheduled to arrive at 0700  for Visit 3 appointment, however subject arrived at 0915. This coordinator re-educated the subject on the importance of arriving on time for research appointments due to the time requirements for the procedures. Subject stated understanding.   PRO Questionnaires were completed prior to any other study activity. After completion of the questionnaires the subject revealed she took her maintenance inhaler at  approximately 0630 on 06Feb2018, and then took her short-acting bronchodilator at 0815 on 06Feb2018. The subject states she remembered this coordinator advising her to not take these medications, but only after she took them. Subject was re-educated on the  need to hold these medications when advised to ensure the best data is obtained. Subject stated understanding.   No further procedures were completed once this information was revealed. Subject has been rescheduled for Visit 3 for 07Feb2018. Subject was advised to wear the activity monitor on 06Feb2018 and bring it to the appointment set. Subject again advised to hold the medications the morning of 07Feb2018. Subject stated understanding of this.   Refer to the subjects paper source binder for further documentation.   Signed by Carron CurieJennifer Maryl Harper, CMA,  Clinical Research Coordinator [facility]  [place], [place] 10:03 AM 12/04/2016

## 2016-12-05 ENCOUNTER — Ambulatory Visit (INDEPENDENT_AMBULATORY_CARE_PROVIDER_SITE_OTHER): Payer: BLUE CROSS/BLUE SHIELD | Admitting: Internal Medicine

## 2016-12-05 VITALS — BP 124/79 | HR 86 | Temp 97.9°F | Resp 14 | Ht 62.0 in | Wt 158.4 lb

## 2016-12-05 DIAGNOSIS — Z006 Encounter for examination for normal comparison and control in clinical research program: Secondary | ICD-10-CM

## 2016-12-05 DIAGNOSIS — J449 Chronic obstructive pulmonary disease, unspecified: Secondary | ICD-10-CM | POA: Diagnosis not present

## 2016-12-05 LAB — PULMONARY FUNCTION TEST
FEF 25-75 Post: 0.76 L/sec
FEF 25-75 Pre: 0.51 L/sec
FEF2575-%CHANGE-POST: 48 %
FEF2575-%PRED-PRE: 23 %
FEF2575-%Pred-Post: 35 %
FEV1-%CHANGE-POST: 12 %
FEV1-%Pred-Post: 55 %
FEV1-%Pred-Pre: 48 %
FEV1-PRE: 1.13 L
FEV1-Post: 1.27 L
FEV1FVC-%CHANGE-POST: 4 %
FEV1FVC-%Pred-Pre: 74 %
FEV6-%Change-Post: 10 %
FEV6-%PRED-PRE: 65 %
FEV6-%Pred-Post: 72 %
FEV6-POST: 2.09 L
FEV6-PRE: 1.9 L
FEV6FVC-%Change-Post: 1 %
FEV6FVC-%PRED-PRE: 102 %
FEV6FVC-%Pred-Post: 104 %
FVC-%Change-Post: 8 %
FVC-%PRED-POST: 69 %
FVC-%PRED-PRE: 64 %
FVC-POST: 2.09 L
FVC-PRE: 1.93 L
POST FEV6/FVC RATIO: 100 %
PRE FEV1/FVC RATIO: 58 %
PRE FEV6/FVC RATIO: 98 %
Post FEV1/FVC ratio: 61 %

## 2016-12-05 MED ORDER — FLUTICASONE-UMECLIDIN-VILANT 100-62.5-25 MCG/INH IN AEPB: 1.0000 | INHALATION_SPRAY | Freq: Every day | RESPIRATORY_TRACT | 11 refills | Status: DC

## 2016-12-05 NOTE — Progress Notes (Signed)
Title: Randomised, Double-Blind (Sponsor Open), Placebo-Controlled, Multicentre, Dose Ranging Study to Evaluate the Efficacy and Safety of Danirixin Tablets Administered Twice Daily Compared With Placebo for 24 Weeks in Adults With COPD  Sponsor: [employer] , Protoocol Number: [id number], NCT Trial #: [id number]  Synopsis:  Following baseline assessments collected over a 7 day period participants will be randomized (1:1:1:1:1:1) to receive one of five dose strengths of danirixin (5 milligram [mg], 10 mg, 25 mg, 35 mg and 50 mg) or placebo. Study treatment will be administered orally twice daily for 24 weeks. Participants will continue with their standard of care inhaled medications during study. Follow up will continue up to 28 days post last dose  Key Inclusion Criteria: age 62-80, COPD with fev1 >/= 40%, ratio <0.7, >/= 2 copd exacerbations in past 1 year treated with either antibiotics or steroids, but >/= 14 days since last antibiotic or steroid for exacerbation, >/= 10 pack smoking history  Key Exclusion Criteria: non-COPD lung disease, pulse ox < 88% at rest on room air (i.e, restting o2 patients excluded), active phase of pulmonary rehabilitation (maintenance ok), previous lung surgery, QTc prolongation, HIV positive etc..   Key end points: Changes in respiratory symptoms and copd exacerbations and safety of DNX  Key features of DNX:   selective CXC chemokine receptor (CXCR2) antagonist . Positive trends in COPD exacerbation, symptoms and health status  Safety of DNX: As of April 2017 (5 completed Phase 1 studies, and 1 Phase 2A study of moderate copd patients with 45 patients):Marland Kitchen. Most side effects are seen at 100mg  bid (current study max is 50mg  bid)  Phase 1 Normal Adults - dose </= 50mg  bid Study drug -    placebo  General All side effects mild - moderate. No changes in labs, EKG and vitals including PMN per PI meeting slide  x  Any adverse event 30% 13%  Headache 10-20% 0-17%   Fatigue/Lethargy 0-10% 0-3%  Abd Pain 0-11% (20% at 200mg  bid) 0%   Cough 0% 0%  Study withdrawal 0% 0%  SAE 0% 0%   - 3. COPD  COPD patients -data from IB April 2017 and PI meeting Spring 2017 Study drug Placebo  Efficacy  AECOPD and CAT scores better with drug after 1 year x  General No change in PMN, EKG, Vitals, Labs, Spirometry but produces mild to moderate headache and diarrhea  x  Any AE 52-77% 55-80%  Cystitis 5% at 75mg  bid 3%  Backpain 5% at 75mg  bid 0%  Nasopharyngitis 29% at 75mg  bid 33%  Headache 9% at 75mg  bid 8%  Upper abdominal pain 0% at 75mg  bid 6%  Infection rate 42% 50%  SAE 10 events 31 events   ........................................................................................................................................................................ Clinical Research Coordinator / Research RN note : This visit for Subject Ronnald RampBonnie Player with DOB of 1956/09/11 on 12/05/2016 for the above protocol is Visit 3 Randomization  and is for purpose of research . The consent for this encounter is under Protocol Version Amendment 1 and is currently IRB approved. Subject expressed continued interest and consent in continuing as a study subject. Subject thanked for participation.   Subject returned today on 07Feb2018 to complete visit 3 procedures. Refer to progress note from 06Feb2018 for details on Visit 3 activities performed that day. This coordinator verified with the subject that she held her morning maintenance and short-acting inhalers. PRO Questionnaires were completed on 06Feb2018 and could not be repeated today. Activity monitor and e-diary data has been printed and filed in subjects paper source binder.  All procedures were completed per the above mentioned protocol. Refer to the subjects paper source binder for further documentation.   Subject was determined to be eligible to randomize. Study drug was assigned, verified by this coordinator and Dr. Kalman Shan, and administered to the subject with 8 oz of water and a pack of crackers. The subject was monitored for 30 minutes post-dose, and did not show any signs of reaction to the study drug. Subject denied any complaints. The subject was instructed to take the study drug twice a day with a light snack and 8 oz of water. Subject stated understanding.  Subject was provided a patient ID card. The subjects Visit 4 was scheduled.    In this visit on 12/05/2016 the subject will be evaluated by investigator named Kalman Shan, MD  . This research coordinator has verified that the investigator is uptodate with his training logs.   Signed by  Carron Curie, CMA, Research Assistant Clinical Research Coordinator [facility]  [place], [place] 5:08 PM 12/05/2016

## 2016-12-05 NOTE — Progress Notes (Signed)
pft research  Dr. Kalman ShanMurali [place] Giammona, M.D., [facility]C.P Pulmonary and Critical Care Medicine Staff Physician [facility] [place] [facility] Pager: [phone], If no answer or between  15:00h - 7:00h: call [phone]  12/05/2016 3:21 PM

## 2016-12-05 NOTE — Progress Notes (Signed)
Title: Randomised, Double-Blind (Sponsor Open), Placebo-Controlled, Multicentre, Dose Ranging Study to Evaluate the Efficacy and Safety of Danirixin Tablets Administered Twice Daily Compared With Placebo for 24 Weeks in Adults With COPD  Sponsor: [employer] , Protoocol Number: [id number], NCT Trial #: [id number]  Synopsis:  Following baseline assessments collected over a 7 day period participants will be randomized (1:1:1:1:1:1) to receive one of five dose strengths of danirixin (5 milligram [mg], 10 mg, 25 mg, 35 mg and 50 mg) or placebo. Study treatment will be administered orally twice daily for 24 weeks. Participants will continue with their standard of care inhaled medications during study. Follow up will continue up to 28 days post last dose  Key Inclusion Criteria: age 39-80, COPD with fev1 >/= 40%, ratio <0.7, >/= 2 copd exacerbations in past 1 year treated with either antibiotics or steroids, but >/= 14 days since last antibiotic or steroid for exacerbation, >/= 10 pack smoking history  Key Exclusion Criteria: non-COPD lung disease, pulse ox < 88% at rest on room air (i.e, restting o2 patients excluded), active phase of pulmonary rehabilitation (maintenance ok), previous lung surgery, QTc prolongation, HIV positive etc..   Key end points: Changes in respiratory symptoms and copd exacerbations and safety of DNX  Key features of DNX:   selective CXC chemokine receptor (CXCR2) antagonist . Positive trends in COPD exacerbation, symptoms and health status  Safety of DNX: As of April 2017 (5 completed Phase 1 studies, and 1 Phase 2A study of moderate copd patients with 45 patients):Marland Kitchen Most side effects are seen at 100mg  bid (current study max is 50mg  bid)  Phase 1 Normal Adults - dose </= 50mg  bid Study drug -    placebo  General All side effects mild - moderate. No changes in labs, EKG and vitals including PMN per PI meeting slide  x  Any adverse event 30% 13%  Headache 10-20% 0-17%   Fatigue/Lethargy 0-10% 0-3%  Abd Pain 0-11% (20% at 200mg  bid) 0%   Cough 0% 0%  Study withdrawal 0% 0%  SAE 0% 0%   - 3. COPD  COPD patients -data from IB April 2017 and PI meeting Spring 2017 Study drug Placebo  Efficacy  AECOPD and CAT scores better with drug after 1 year x  General No change in PMN, EKG, Vitals, Labs, Spirometry but produces mild to moderate headache and diarrhea  x  Any AE 52-77% 55-80%  Cystitis 5% at 75mg  bid 3%  Backpain 5% at 75mg  bid 0%  Nasopharyngitis 29% at 75mg  bid 33%  Headache 9% at 75mg  bid 8%  Upper abdominal pain 0% at 75mg  bid 6%  Infection rate 42% 50%  SAE 10 events 31 events   ........................................................................................................................................................................   S:V3 randomizatino visit. No new issues. No AEs. Still interested in research study participation.. She likes TRELEGY mdi for maintenance  O: non focal exam filled out in sheet  Morgan Harper - Results for Morgan Harper, Morgan Harper (MRN [id number])   - epic EMR has thhis date as 12/04/16 but that was the ordered date. Performance date was 12/05/16  Ref. Range 12/05/16  FEV1-Post Latest Units: L 1.27  FEV1-%Pred-Post Latest Units: % 55  FEV1-%Change-Post Latest Units: % 12  Post FEV1/FVC ratio Latest Units: % 61   ekg  - reviewed  Labs - to be done   A/P  V3 Randomization visit - research subject COPD   P - prior to randomization I/E criteria re-reviewed  - labs from screening reviewed - hx and ekg:  and spiro reviewed -  exam ok  Based on all of above, randomization done and IP given under my direct visualization  Rest per protocol and source docs    Dr. Kalman ShanMurali Charvis Harper, M.D., [facility] CenterF.C.C.P Pulmonary and Critical Care Medicine Staff Physician [facility] [place] [facility] Pager: [phone], If no answer or between  15:00h - 7:00h: call [phone]  12/06/2016 9:28 PM

## 2016-12-06 NOTE — Patient Instructions (Addendum)
Per protocol    ICD-9-CM ICD-10-CM   1. Research subject V70.7 Z00.6 EKG 12-Lead     EKG 12-Lead     EKG 12-Lead     EKG 12-Lead  2. 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

## 2016-12-07 ENCOUNTER — Telehealth: Payer: Self-pay | Admitting: Internal Medicine

## 2016-12-07 NOTE — Telephone Encounter (Signed)
Morgan Harper Lab tests done 12/05/16 for danrixiin research protocol shows elevated high sensitivity CRP 6.7mg /dL. This sometimes can be variation for the day but sometimes in long run of many years can predict increased risk of heart attack. Typically as far as I know the test gets repeated in a few weeks. This she needs to address with PCP [name], [name], MD   Please send this note to PCP [name], [name], MD  Thanks  Dr. Kalman ShanMurali Lashaye Fisk, M.D., [facility]C.C.P Pulmonary and Critical Care Medicine Staff Physician [facility] [place] [facility] Pager: [phone], If no answer or between  15:00h - 7:00h: call [phone]  12/07/2016 12:05 PM

## 2016-12-10 NOTE — Telephone Encounter (Signed)
lmtcb for pt.  

## 2016-12-11 NOTE — Telephone Encounter (Signed)
Pt returning call and can be reached @ [phone].Morgan GriffinsStanley A Harper

## 2016-12-13 ENCOUNTER — Telehealth: Payer: Self-pay | Admitting: Acute Care

## 2016-12-13 DIAGNOSIS — Z87891 Personal history of nicotine dependence: Secondary | ICD-10-CM

## 2016-12-13 NOTE — Telephone Encounter (Signed)
Spoke with Victorino DikeJennifer w/ [facility] Order for LDCT placed

## 2016-12-14 ENCOUNTER — Other Ambulatory Visit: Payer: Self-pay | Admitting: Family Medicine

## 2016-12-14 ENCOUNTER — Encounter: Payer: Self-pay | Admitting: Family Medicine

## 2016-12-14 MED ORDER — SERTRALINE HCL 100 MG PO TABS: 100.0000 mg | ORAL_TABLET | Freq: Every day | ORAL | 1 refills | Status: DC

## 2016-12-14 MED ORDER — LANSOPRAZOLE 30 MG PO CPDR: 30.0000 mg | DELAYED_RELEASE_CAPSULE | Freq: Every day | ORAL | 1 refills | Status: DC

## 2016-12-14 MED FILL — SERTRALINE HCL 100 MG TAB: 100 | 30 days supply | Qty: 30 | Fill #0 | Status: TO

## 2016-12-14 NOTE — Telephone Encounter (Signed)
Spoke with pt and confirmed that SDMV was made for 12/19/16 12:00. CT was ordered. Nothing further is needed

## 2016-12-17 NOTE — Telephone Encounter (Signed)
Left message for patient to call office for medical results.

## 2016-12-17 NOTE — Telephone Encounter (Signed)
Pt returned call and was informed her of results. Pt was informed to discuss results with PCP. Pt did not have any questions. Note will be routed to Dr. Danise EdgeStacey Blyth. Nothing further needed.

## 2016-12-19 ENCOUNTER — Ambulatory Visit: Admission: RE | Admit: 2016-12-19 | Payer: BLUE CROSS/BLUE SHIELD | Source: Ambulatory Visit

## 2016-12-19 ENCOUNTER — Ambulatory Visit (INDEPENDENT_AMBULATORY_CARE_PROVIDER_SITE_OTHER)
Admission: RE | Admit: 2016-12-19 | Discharge: 2016-12-19 | Disposition: A | Discharge status: Home / Self Care | Payer: BLUE CROSS/BLUE SHIELD | Source: Ambulatory Visit | Attending: Acute Care | Admitting: Acute Care

## 2016-12-19 ENCOUNTER — Ambulatory Visit (INDEPENDENT_AMBULATORY_CARE_PROVIDER_SITE_OTHER): Payer: BLUE CROSS/BLUE SHIELD | Admitting: Acute Care

## 2016-12-19 ENCOUNTER — Encounter: Payer: Self-pay | Admitting: Acute Care

## 2016-12-19 VITALS — BP 138/72 | HR 92 | Temp 98.3°F | Ht 62.0 in | Wt 157.2 lb

## 2016-12-19 DIAGNOSIS — J209 Acute bronchitis, unspecified: Secondary | ICD-10-CM | POA: Diagnosis not present

## 2016-12-19 DIAGNOSIS — J441 Chronic obstructive pulmonary disease with (acute) exacerbation: Secondary | ICD-10-CM | POA: Diagnosis not present

## 2016-12-19 DIAGNOSIS — R0602 Shortness of breath: Secondary | ICD-10-CM

## 2016-12-19 DIAGNOSIS — Z006 Encounter for examination for normal comparison and control in clinical research program: Secondary | ICD-10-CM

## 2016-12-19 DIAGNOSIS — J449 Chronic obstructive pulmonary disease, unspecified: Secondary | ICD-10-CM

## 2016-12-19 DIAGNOSIS — Z720 Tobacco use: Secondary | ICD-10-CM | POA: Diagnosis not present

## 2016-12-19 MED ORDER — LEVOFLOXACIN 500 MG PO TABS: 500.0000 mg | ORAL_TABLET | Freq: Every day | ORAL | 0 refills | Status: DC

## 2016-12-19 MED ORDER — PREDNISONE 10 MG PO TABS: ORAL_TABLET | ORAL | 0 refills | Status: DC

## 2016-12-19 MED ORDER — HYDROCODONE-HOMATROPINE 5-1.5 MG/5ML PO SYRP: 5.0000 mL | ORAL_SOLUTION | Freq: Four times a day (QID) | ORAL | 0 refills | Status: DC | PRN

## 2016-12-19 MED ORDER — LEVALBUTEROL HCL 0.63 MG/3ML IN NEBU
0.6300 mg | INHALATION_SOLUTION | Freq: Once | RESPIRATORY_TRACT | Status: AC
  Administered 2016-12-19: 0.63 mg via RESPIRATORY_TRACT

## 2016-12-19 NOTE — Progress Notes (Signed)
Title: Randomised, Double-Blind (Sponsor Open), Placebo-Controlled, Multicentre, Dose Ranging Study to Evaluate the Efficacy and Safety of Danirixin Tablets Administered Twice Daily Compared With Placebo for 24 Weeks in Adults With COPD  Sponsor: [employer] , Protoocol Number: [id number], NCT Trial #: [id number]  Synopsis:  Following baseline assessments collected over a 7 day period participants will be randomized (1:1:1:1:1:1) to receive one of five dose strengths of danirixin (5 milligram [mg], 10 mg, 25 mg, 35 mg and 50 mg) or placebo. Study treatment will be administered orally twice daily for 24 weeks. Participants will continue with their standard of care inhaled medications during study. Follow up will continue up to 28 days post last dose  Key Inclusion Criteria: age 62-80, COPD with fev1 >/= 40%, ratio <0.7, >/= 2 copd exacerbations in past 1 year treated with either antibiotics or steroids, but >/= 14 days since last antibiotic or steroid for exacerbation, >/= 10 pack smoking history  Key Exclusion Criteria: non-COPD lung disease, pulse ox < 88% at rest on room air (i.e, restting o2 patients excluded), active phase of pulmonary rehabilitation (maintenance ok), previous lung surgery, QTc prolongation, HIV positive etc..   Key end points: Changes in respiratory symptoms and copd exacerbations and safety of DNX  Key features of DNX:   selective CXC chemokine receptor (CXCR2) antagonist . Positive trends in COPD exacerbation, symptoms and health status  Safety of DNX: As of April 2017 (5 completed Phase 1 studies, and 1 Phase 2A study of moderate copd patients with 45 patients):Marland Kitchen Most side effects are seen at 100mg  bid (current study max is 50mg  bid)  Phase 1 Normal Adults - dose </= 50mg  bid Study drug -    placebo  General All side effects mild - moderate. No changes in labs, EKG and vitals including PMN per PI meeting slide  x  Any adverse event 30% 13%  Headache 10-20% 0-17%   Fatigue/Lethargy 0-10% 0-3%  Abd Pain 0-11% (20% at 200mg  bid) 0%   Cough 0% 0%  Study withdrawal 0% 0%  SAE 0% 0%   - 3. COPD  COPD patients -data from IB April 2017 and PI meeting Spring 2017 Study drug Placebo  Efficacy  AECOPD and CAT scores better with drug after 1 year x  General No change in PMN, EKG, Vitals, Labs, Spirometry but produces mild to moderate headache and diarrhea  x  Any AE 52-77% 55-80%  Cystitis 5% at 75mg  bid 3%  Backpain 5% at 75mg  bid 0%  Nasopharyngitis 29% at 75mg  bid 33%  Headache 9% at 75mg  bid 8%  Upper abdominal pain 0% at 75mg  bid 6%  Infection rate 42% 50%  SAE 10 events 31 events   ........................................................................................................................................................................ Clinical Research Coordinator / Research RN note : This visit for Subject Morgan Harper with DOB 02/17/1956 on 12/19/2016 for the above protocol is Visit/Encounter # 4  and is for purpose of research . The consent for this encounter is under Protocol Version Amendment 1 and is currently IRB approved. Subject expressed continued interest and consent in continuing as a study subject. Subject thanked for participation.   All required procedures were completed per the above mentioned protocol guidelines. The subject was counseled on IP compliance and reminded of the importance to take the study medication with a light meal. Refer to the subjects paper source binder for further documentation.   The subject mentioned not feeling well. She complained of having increased chest congestion, increased productive cough with yellow phlegm (she states the color of  the phlegm is her baseline), increased SOB, and increased wheezing. She denies having a fever, body aches/chills, chest tightness, or sinus congestion. She has not taken anything for these symptoms except an increase use of her rescue inhaler. Subject states the  symptoms began on 15/Feb/2018. Kinnie FeilStacey Phelps, RN study coordinator auscultated the subject and advised that she be evaluated further. Refer to note from Kinnie FeilStacey, Phelps, RN for further documentation.   The subject was scheduled to see a [facility] provider for an acute standard of care visit to address these symptoms. Please refer to progress note from Kandice RobinsonsSarah Groce, NP for further documentation.   Signed by [name], CMA, Research Assistant  Clinical Research Coordinator [facility]  [place], [place] 1:15 PM 12/19/2016

## 2016-12-19 NOTE — Progress Notes (Signed)
History of Present Illness Morgan Harper is a 61 y.o. female former smoker  with with a 45 pack year smoking history with newly diagnosed COPD Gold Stage II. She is a former smoker, quit 03/2016. She is followed by Dr. Marchelle Gearing.  This patient is participating in the Danirixin Study through [facility].   12/19/2016 Acute OV: Pt presented to the office today for a shared decision making visit for lung cancer screening, but was found to be having a COPD flare, so we have converted the visit to an acute visit. We will re-schedule the lung cancer screening.Pt. Presents today with history of cough x 5 days. This cough is non-productive. What she does get up is light yellow.She has complaints of dyspnea since Thursday. She has also noticed increased wheezing, and chest congestion. She used her rescue inhaler twice yesterday. She is maintained on Trelegy  and the study medication. She states she has not been exposed to flu. 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  Tests:  CXR 12/19/2016:>>   Tests: PFT's 112/21/2017  FVC :2.02/ 69% predicted FEV1 : 1.19/ 53% predicted F/F ratio 59/ 75% predicted TLC 4.44/ 96% predicted DLCO: 12.20/ 60% predicted Interpretation: Moderately severe Obstructive Airways Disease Moderate Diffusion Deficit Default HGB value of 13.4 was used. Last documented  HGB was 10.7 on 04/14/2016  Past medical hx Past Medical History:  Diagnosis Date  . Anemia 01/13/2016  . Chronic bronchitis (HCC)   . Depression 09/24/2015  . Diarrhea 04/29/2016  . GERD (gastroesophageal reflux disease)   . H/O measles   . H/O mumps   . Hiatal hernia   . History of chicken pox   . Hyperlipidemia, mixed 09/24/2015  . Hypertension   . Insomnia      Past surgical hx, Family hx, Social hx all reviewed.  Current Outpatient Prescriptions on File Prior to Visit  Medication Sig  .  albuterol (PROVENTIL HFA;VENTOLIN HFA) 108 (90 Base) MCG/ACT inhaler Inhale 1-2 puffs into the lungs every 6 (six) hours as needed for wheezing or shortness of breath.  Marland Kitchen amLODipine (NORVASC) 10 MG tablet Take 1 tablet (10 mg total) by mouth daily.  . benzonatate (TESSALON) 100 MG capsule Take 1 capsule (100 mg total) by mouth 3 (three) times daily as needed for cough.  . Fluticasone-Umeclidin-Vilant (TRELEGY ELLIPTA) 100-62.5-25 MCG/INH AEPB Inhale 1 puff into the lungs daily.  . lansoprazole (PREVACID) 30 MG capsule Take 1 capsule (30 mg total) by mouth daily.  . sertraline (ZOLOFT) 100 MG tablet Take 1 tablet (100 mg total) by mouth daily.  Marland Kitchen Spacer/Aero-Holding Chambers (AEROCHAMBER PLUS) inhaler Use as instructed   Current Facility-Administered Medications on File Prior to Visit  Medication  . ipratropium-albuterol (DUONEB) 0.5-2.5 (3) MG/3ML nebulizer solution 3 mL  . methylPREDNISolone acetate (DEPO-MEDROL) injection 40 mg     No Known Allergies  Review Of Systems:  Constitutional:   No  weight loss, night sweats,  Fevers, chills, fatigue, or  lassitude.  HEENT:   No headaches,  Difficulty swallowing,  Tooth/dental problems, or  Sore throat,                No sneezing, itching, ear ache, nasal congestion, post nasal drip,   CV:  No chest pain,  Orthopnea, PND, swelling in lower extremities, anasarca, dizziness, palpitations, syncope.   GI  No heartburn, indigestion, abdominal pain, nausea, vomiting, diarrhea, change in bowel habits, loss of appetite, bloody  stools.   Resp: + shortness of breath with exertion less at rest.  No excess mucus, no productive cough,  + non-productive cough,  No coughing up of blood.  No change in color of mucus.  + wheezing.  No chest wall deformity  Skin: no rash or lesions.  GU: no dysuria, change in color of urine, no urgency or frequency.  No flank pain, no hematuria   MS:  No joint pain or swelling.  No decreased range of motion.  No back  pain.  Psych:  No change in mood or affect. No depression or anxiety.  No memory loss.   Vital Signs BP 138/72 (BP Location: Left Arm, Cuff Size: Normal)   Pulse 92   Temp 98.3 F (36.8 C) (Oral)   Ht 5\' 2"  (1.575 m)   Wt 157 lb 3.2 oz (71.3 kg)   SpO2 98%   BMI 28.75 kg/m    Physical Exam:  General- No distress,  A&Ox3, pleasant, coughing ENT: No sinus tenderness, TM clear, pale nasal mucosa, no oral exudate,no post nasal drip, no LAN Cardiac: S1, S2, regular rate and rhythm, no murmur Chest: + Inspiratory and Expiratory wheeze/ No rales/ dullness; no accessory muscle use, no nasal flaring, no sternal retractions Abd.: Soft Non-tender, rounded Ext: No clubbing cyanosis, edema Neuro:  normal strength Skin: No rashes, warm and dry Psych: normal mood and behavior   Assessment/Plan  COPD exacerbation (HCC) Mild Flare with Bronchitis Increasing dyspnea x 5 days Non-productive Cough x 5 days Plan: We will check a CXR today. Neb treatment now Levaquin 500 mg once daily x 7 days . Take Align Probiotic while on antibiotics. Prednisone taper; 10 mg tablets: 4 tabs x 2 days, 3 tabs x 2 days, 2 tabs x 2 days 1 tab x 2 days then stop.Marland Kitchen. Hydromet 5 cc's at bedtime for cough Don't drive if sleepy. Mucinex 1200 mg once daily with water. Continue taking study medication as prescribed. Continue Trelegy as you have been doing for maintenance Rescue inhaler as needed for breakthrough shortness of breath. Follow up with Kandice RobinsonsSarah Cauy Melody, NP in 2 weeks. Please contact office for sooner follow up if symptoms do not improve or worsen or seek emergency care    Acute bronchitis Moderate flare Plan: We will check a CXR today. Neb treatment now Levaquin 500 mg x 7 days . Take Align Probiotic while on antibiotics. Prednisone taper; 10 mg tablets: 4 tabs x 2 days, 3 tabs x 2 days, 2 tabs x 2 days 1 tab x 2 days then stop.Marland Kitchen. Hydromet 5 cc's at bedtime for cough Don't drive if sleepy. Mucinex  1200 mg once daily with water. Continue taking study medication as prescribed. Continue Trelegy as you have been doing for maintenance Rescue inhaler as needed for breakthrough shortness of breath. Follow up with Kandice RobinsonsSarah Jelina Paulsen, NP in 2 weeks. Please contact office for sooner follow up if symptoms do not improve or worsen or seek emergency care    Tobacco abuse Former smoker, Quit 03/2016 Plan: Counseled to continue abstinence from cigarettes We will re-schedule her SDMV and Low Dose CT scan for lung cancer screening. Follow up in 2 weeks Please contact office for sooner follow up if symptoms do not improve or worsen or seek emergency care     Bevelyn NgoSarah F Nani Ingram, NP 12/19/2016  12:37 PM

## 2016-12-19 NOTE — Patient Instructions (Addendum)
It is good to see you today. We will check a CXR today. Neb treatment now Levaquin 500 mg x 7 days . Take Align Probiotic while on antibiotics. Prednisone taper; 10 mg tablets: 4 tabs x 2 days, 3 tabs x 2 days, 2 tabs x 2 days 1 tab x 2 days then stop.Morgan Harper. Hydromet 5 cc's at bedtime for cough Mucinex 1200 mg once daily with water. Don't drive if sleepy. Follow up with Kandice RobinsonsSarah Groce, NP in 2 weeks. Please contact office for sooner follow up if symptoms do not improve or worsen or seek emergency care

## 2016-12-19 NOTE — Assessment & Plan Note (Addendum)
Mild Flare with Bronchitis Increasing dyspnea x 5 days Non-productive Cough x 5 days Plan: We will check a CXR today. Neb treatment now Levaquin 500 mg once daily x 7 days . Take Align Probiotic while on antibiotics. Prednisone taper; 10 mg tablets: 4 tabs x 2 days, 3 tabs x 2 days, 2 tabs x 2 days 1 tab x 2 days then stop.Marland Kitchen. Hydromet 5 cc's at bedtime for cough Don't drive if sleepy. Mucinex 1200 mg once daily with water. Continue taking study medication as prescribed. Continue Trelegy as you have been doing for maintenance Rescue inhaler as needed for breakthrough shortness of breath. Follow up with Morgan RobinsonsSarah Morgana Rowley, NP in 2 weeks. Please contact office for sooner follow up if symptoms do not improve or worsen or seek emergency care

## 2016-12-19 NOTE — Progress Notes (Unsigned)
Title: Randomised, Double-Blind (Sponsor Open), Placebo-Controlled, Multicentre, Dose Ranging Study to Evaluate the Efficacy and Safety of Danirixin Tablets Administered Twice Daily Compared With Placebo for 24 Weeks in Adults With COPD  Sponsor: [employer] , Protoocol Number: [id number], NCT Trial #: [id number]  Synopsis:  Following baseline assessments collected over a 7 day period participants will be randomized (1:1:1:1:1:1) to receive one of five dose strengths of danirixin (5 milligram [mg], 10 mg, 25 mg, 35 mg and 50 mg) or placebo. Study treatment will be administered orally twice daily for 24 weeks. Participants will continue with their standard of care inhaled medications during study. Follow up will continue up to 28 days post last dose  Key Inclusion Criteria: age 7-80, COPD with fev1 >/= 40%, ratio <0.7, >/= 2 copd exacerbations in past 1 year treated with either antibiotics or steroids, but >/= 14 days since last antibiotic or steroid for exacerbation, >/= 10 pack smoking history  Key Exclusion Criteria: non-COPD lung disease, pulse ox < 88% at rest on room air (i.e, restting o2 patients excluded), active phase of pulmonary rehabilitation (maintenance ok), previous lung surgery, QTc prolongation, HIV positive etc..   Key end points: Changes in respiratory symptoms and copd exacerbations and safety of DNX  Key features of DNX:   selective CXC chemokine receptor (CXCR2) antagonist . Positive trends in COPD exacerbation, symptoms and health status  Safety of DNX: As of April 2017 (5 completed Phase 1 studies, and 1 Phase 2A study of moderate copd patients with 45 patients):Marland Kitchen Most side effects are seen at 100mg  bid (current study max is 50mg  bid)  Phase 1 Normal Adults - dose </= 50mg  bid Study drug -    placebo  General All side effects mild - moderate. No changes in labs, EKG and vitals including PMN per PI meeting slide  x  Any adverse event 30% 13%  Headache 10-20% 0-17%   Fatigue/Lethargy 0-10% 0-3%  Abd Pain 0-11% (20% at 200mg  bid) 0%   Cough 0% 0%  Study withdrawal 0% 0%  SAE 0% 0%   - 3. COPD  COPD patients -data from IB April 2017 and PI meeting Spring 2017 Study drug Placebo  Efficacy  AECOPD and CAT scores better with drug after 1 year x  General No change in PMN, EKG, Vitals, Labs, Spirometry but produces mild to moderate headache and diarrhea  x  Any AE 52-77% 55-80%  Cystitis 5% at 75mg  bid 3%  Backpain 5% at 75mg  bid 0%  Nasopharyngitis 29% at 75mg  bid 33%  Headache 9% at 75mg  bid 8%  Upper abdominal pain 0% at 75mg  bid 6%  Infection rate 42% 50%  SAE 10 events 31 events   ........................................................................................................................................................................ Clinical Research Coordinator / Research RN note : This visit for Subject Morgan Harper with [id number] on 12/19/2016 for the above protocol is Visit/Encounter # 4 and is for purpose of Visit 4 procedures. The consent for this encounter is under Protocol Version Amendment 1 and  is currently IRB approved. Subject expressed continued interest and consent in continuing as a study subject. Subject thanked for participation. The purpose for this encounter : Visit 4 procedures  Visit conducted by Carron Curie, CMA.  See her documentation.  This RN present to confirm drug accountability.  During visit, subject mentioned a worsening of baseline COPD symptoms to Carron Curie, CMA.  Due to patient report of worsening in cough, chest congestion, dyspnea, wheeze and general feeling of malaise/feeling "blah," this RN assessed subject and auscultated subject's chest.  Subject positive for expiratory wheeze, fine crackles in right lower base.  Subject with a frequent, congested sounding cough.  Subject denied fever or sinus congestion, stated she has not been around anyone with the flu or other viral infections.  This  RN inquired from subject if symptoms were at a level to which she would normally seek care.  Subject answered "no" but stated that she only seeks care when she is "really really bad."  Patient is scheduled to travel out of state for an extended period of time, potentially leaving on the upcoming Monday.  [facility] as part of routine care.  This RN advised subject to review symptoms with NP during that visit to determine if this is symptomatic of a possible flare or other URI.  Signed by Kinnie FeilStacey Javelle Donigan, RN, BSN, [facility] Clinical Research Nurse II Office [phone] [facility]  [place] 3:25 PM 12/19/2016

## 2016-12-19 NOTE — Assessment & Plan Note (Addendum)
Moderate flare Plan: We will check a CXR today. Neb treatment now Levaquin 500 mg x 7 days . Take Align Probiotic while on antibiotics. Prednisone taper; 10 mg tablets: 4 tabs x 2 days, 3 tabs x 2 days, 2 tabs x 2 days 1 tab x 2 days then stop.Marland Kitchen. Hydromet 5 cc's at bedtime for cough Don't drive if sleepy. Mucinex 1200 mg once daily with water. Continue taking study medication as prescribed. Continue Trelegy as you have been doing for maintenance Rescue inhaler as needed for breakthrough shortness of breath. Follow up with Kandice RobinsonsSarah Lenka Zhao, NP in 2 weeks. Please contact office for sooner follow up if symptoms do not improve or worsen or seek emergency care

## 2016-12-19 NOTE — Assessment & Plan Note (Signed)
Former smoker, Quit 03/2016 Plan: Counseled to continue abstinence from cigarettes We will re-schedule her SDMV and Low Dose CT scan for lung cancer screening. Follow up in 2 weeks Please contact office for sooner follow up if symptoms do not improve or worsen or seek emergency care

## 2016-12-21 DIAGNOSIS — Z006 Encounter for examination for normal comparison and control in clinical research program: Secondary | ICD-10-CM

## 2016-12-21 DIAGNOSIS — J449 Chronic obstructive pulmonary disease, unspecified: Secondary | ICD-10-CM

## 2016-12-21 NOTE — Progress Notes (Signed)
Title: Randomised, Double-Blind (Sponsor Open), Placebo-Controlled, Multicentre, Dose Ranging Study to Evaluate the Efficacy and Safety of Danirixin Tablets Administered Twice Daily Compared With Placebo for 24 Weeks in Adults With COPD  Sponsor: [employer] , Protoocol Number: [id number], NCT Trial #: [id number]  Synopsis:  Following baseline assessments collected over a 7 day period participants will be randomized (1:1:1:1:1:1) to receive one of five dose strengths of danirixin (5 milligram [mg], 10 mg, 25 mg, 35 mg and 50 mg) or placebo. Study treatment will be administered orally twice daily for 24 weeks. Participants will continue with their standard of care inhaled medications during study. Follow up will continue up to 28 days post last dose  Key Inclusion Criteria: age 62-80, COPD with fev1 >/= 40%, ratio <0.7, >/= 2 copd exacerbations in past 1 year treated with either antibiotics or steroids, but >/= 14 days since last antibiotic or steroid for exacerbation, >/= 10 pack smoking history  Key Exclusion Criteria: non-COPD lung disease, pulse ox < 88% at rest on room air (i.e, restting o2 patients excluded), active phase of pulmonary rehabilitation (maintenance ok), previous lung surgery, QTc prolongation, HIV positive etc..   Key end points: Changes in respiratory symptoms and copd exacerbations and safety of DNX  Key features of DNX:   selective CXC chemokine receptor (CXCR2) antagonist . Positive trends in COPD exacerbation, symptoms and health status  Safety of DNX: As of April 2017 (5 completed Phase 1 studies, and 1 Phase 2A study of moderate copd patients with 45 patients):Marland Kitchen. Most side effects are seen at 100mg  bid (current study max is 50mg  bid)  Phase 1 Normal Adults - dose </= 50mg  bid Study drug -    placebo  General All side effects mild - moderate. No changes in labs, EKG and vitals including PMN per PI meeting slide  x  Any adverse event 30% 13%  Headache 10-20% 0-17%   Fatigue/Lethargy 0-10% 0-3%  Abd Pain 0-11% (20% at 200mg  bid) 0%   Cough 0% 0%  Study withdrawal 0% 0%  SAE 0% 0%   - 3. COPD  COPD patients -data from IB April 2017 and PI meeting Spring 2017 Study drug Placebo  Efficacy  AECOPD and CAT scores better with drug after 1 year x  General No change in PMN, EKG, Vitals, Labs, Spirometry but produces mild to moderate headache and diarrhea  x  Any AE 52-77% 55-80%  Cystitis 5% at 75mg  bid 3%  Backpain 5% at 75mg  bid 0%  Nasopharyngitis 29% at 75mg  bid 33%  Headache 9% at 75mg  bid 8%  Upper abdominal pain 0% at 75mg  bid 6%  Infection rate 42% 50%  SAE 10 events 31 events   ........................................................................................................................................................................ Clinical Research Coordinator / Research RN note : This visit for Subject Morgan Harper with [birth year] on 12/21/2016 for the above protocol is Visit/Encounter # 5 and is for purpose of Visit 5 procedures and IP resupply. The consent for this encounter is under Protocol Version Amendment 1 and is currently IRB approved.  Subject expressed continued interest and consent in continuing as a study subject. Subject thanked for participation.   Subject called this morning and notified this RN that she has a requirement to travel for work for 3 weeks and unable to return for Visit 5 as previously scheduled.  The work trip was not anticipated to be three weeks long. This RN discussed scenario with monitor Lori Repass.  Through discussion, out of window visit would have to occur either early out of  window by 8 days or late of out window by 7 days.  However, subject did not have enough study drug supply to remain compliant with administration if visit were to occur late out of window. Subject able to return to clinic today to complete visit.  Decision was made to proceed with early out of visit window today.  In this visit  12/21/2016 the subject is not scheduled be evaluated by investigator this visit, per protocol. PI, Dr. Kalman Shan, MD alerted prior to Visit as to subject travel constraints and need for out of window visit.  He agreed with proceeding of Visit 5 today, early out of window.  Visit 5 performed early, 8 days out of window, to ensure visit procedures were completed and to ensure subject had enough investigational product supply to last through her travel.    The purpose and/or following were chief complaints for this encounter : Visit 5 procedures, IP resupply.  Patient completed questionnaires and has been 100% compliant with IP administration since last visit.  Patient reminded to take medication with food; she verbalizes understanding.  Patient dispensed newly assigned bottle and redispensed bottle assigned at Visit 4.  Patient needs both bottles to maintain IP compliance before next visit, due to this visit occurring early out of window.  Rubye Oaks, NP confirmed bottle assignment from Visit 5 and IP accountability from bottle dispensed at Visit 4.  Patient blood pressure slightly elevated, patient reports feeling stressed with short notice of upcoming travel plans from work and currently still experiencing an ongoing COPD exacerbation (previously reported).  Last blood pressure measurement near patient's last visit.  This RN took an extra blood pressure recheck prior to subject leaving and blood pressure was at patient baseline.  PI notified of blood pressure, no orders given, within normal variation.  ECG and lab work completed per protocol.  Patient again reminded of importance of following protocol defined schedule. Patient verbalized understanding.  Signed by Kinnie Feil, RN, BSN, [facility] Clinical Research Nurse II [facility] Office [phone] [place], [place] 1:33 PM 12/21/2016

## 2016-12-26 ENCOUNTER — Telehealth: Payer: Self-pay | Admitting: *Deleted

## 2016-12-26 NOTE — Telephone Encounter (Signed)
Title: Randomised, Double-Blind (Sponsor Open), Placebo-Controlled, Multicentre, Dose Ranging Study to Evaluate the Efficacy and Safety of Danirixin Tablets Administered Twice Daily Compared With Placebo for 24 Weeks in Adults With COPD ...........................................................................................................................................................Marland Kitchen.  Lab results from [facility] drawn at research visit occurring on 23/Feb/2018 were received today by this RN and reviewed by PI, Dr. Kalman ShanMurali Ramaswamy, MD.  Patient with elevated urea nitrogen, indicative of dehydration.  AE added to AE log and signed by PI.  This RN called and spoke with patient Ronnald RampBonnie Reading. Notified her of an abnormal lab that meant she was likely dehydrated and patient instructed to drink water.  Patient verbalized understanding.  Patient reports no other changes in health, stating she feels well.  Patient again reminded to drink a lot of water and informed that we would repeat the abnormal lab at her next research visit.  Kinnie FeilStacey Marcellina Jonsson, RN, BSN, [facility] Clinical Research Nurse II [employer] [phone]

## 2016-12-26 NOTE — Telephone Encounter (Signed)
PI OVERSIGHT ATTESTATION  I the Principal Investigator (PI) for the above mentioned study attest that I reviewed the above mentioned / clinical research nurse notes on research subject  Morgan RampBonnie Durell  born 1956-07-19 . I  agree with the findings mentioned above. I instructed research nurse to call subject with above information and personally witnessed the conversation.    Dr. Kalman ShanMurali Sagar Tengan, M.D., F.C.C.P., ACRP-CPI Pulmonary and Critical Care Medicine Principal Investigator & Staff Physician [facility]  [place] Pulmonary and Critical Care Pager: [phone], If no answer or between  15:00h - 7:00h: call [phone]  12/26/2016 4:19 PM

## 2017-01-07 ENCOUNTER — Ambulatory Visit: Payer: BLUE CROSS/BLUE SHIELD | Admitting: Adult Health

## 2017-01-21 ENCOUNTER — Telehealth: Payer: Self-pay | Admitting: Internal Medicine

## 2017-01-21 NOTE — Telephone Encounter (Signed)
The patient called the research office to discuss follow-up appointments and while on the phone mentioned that she was not feeling well. The subject complains of increased SOB with minimal activity, productive cough with yellow phlegm, increased chest congestion, and wheezing x 1 week. Subject states she feels the same as see did when she was previously diagnosed with COPD exacerbation in Feb.  The subject is currently on [place] on a work assignment so she is requesting something be called into the pharmacy there. The pharmacy info is: [facility] [address], [place]   Subject advised I will send a message to her primary pulmonary MD to get recommendations. Please advise.   NKDA

## 2017-01-22 MED ORDER — PREDNISONE 10 MG PO TABS: ORAL_TABLET | ORAL | 0 refills | Status: DC

## 2017-01-22 MED ORDER — AZITHROMYCIN 250 MG PO TABS: ORAL_TABLET | ORAL | 0 refills | Status: DC

## 2017-01-22 NOTE — Telephone Encounter (Signed)
Triage  This patient Ronnald RampBonnie Zellers is having an aecopd. She is in [place] area for work. Please call in to nearest pharmacy   Please take prednisone 40 mg x1 day, then 30 mg x1 day, then 20 mg x1 day, then 10 mg x1 day, and then 5 mg x1 day and stop    Z PAK  Dr. Kalman ShanMurali Velecia Ovitt, M.D., [facility] Pulmonary and Critical Care Medicine Staff Physician [facility] [place] [facility]monary and Critical Care Pager: [phone], If no answer or between  15:00h - 7:00h: call [phone]  01/22/2017 1:17 AM

## 2017-01-22 NOTE — Telephone Encounter (Signed)
Spoke with pt and gave her MR's message and pt verified pharmacy she needed this sent to. Rx was sent in, Nothing further is needed.

## 2017-01-25 ENCOUNTER — Telehealth: Payer: Self-pay | Admitting: Internal Medicine

## 2017-01-25 NOTE — Telephone Encounter (Signed)
Title: Randomised, Double-Blind (Sponsor Open), Placebo-Controlled, Multicentre, Dose Ranging Study to Evaluate the Efficacy and Safety of Danirixin Tablets Administered Twice Daily Compared With Placebo for 24 Weeks in Adults With COPD  Sponsor: [employer] , Protoocol Number: [id number], NCT Trial #: [id number]  Synopsis:  Following baseline assessments collected over a 7 day period participants will be randomized (1:1:1:1:1:1) to receive one of five dose strengths of danirixin (5 milligram [mg], 10 mg, 25 mg, 35 mg and 50 mg) or placebo. Study treatment will be administered orally twice daily for 24 weeks. Participants will continue with their standard of care inhaled medications during study. Follow up will continue up to 28 days post last dose  Key Inclusion Criteria: age 107-80, COPD with fev1 >/= 40%, ratio <0.7, >/= 2 copd exacerbations in past 1 year treated with either antibiotics or steroids, but >/= 14 days since last antibiotic or steroid for exacerbation, >/= 10 pack smoking history  Key Exclusion Criteria: non-COPD lung disease, pulse ox < 88% at rest on room air (i.e, restting o2 patients excluded), active phase of pulmonary rehabilitation (maintenance ok), previous lung surgery, QTc prolongation, HIV positive etc..   Key end points: Changes in respiratory symptoms and copd exacerbations and safety of DNX  Key features of DNX:   selective CXC chemokine receptor (CXCR2) antagonist . Positive trends in COPD exacerbation, symptoms and health status  Safety of DNX: As of April 2017 (5 completed Phase 1 studies, and 1 Phase 2A study of moderate copd patients with 45 patients):Marland Kitchen Most side effects are seen at  bid (current study max is  bid)  Phase 1 Normal Adults - dose </=  bid Study drug -    placebo  General All side effects mild - moderate. No changes in labs, EKG and vitals including PMN per PI meeting slide  x  Any adverse event 30% 13%  Headache 10-20% 0-17%   Fatigue/Lethargy 0-10% 0-3%  Abd Pain 0-11% (20% at  bid) 0%   Cough 0% 0%  Study withdrawal 0% 0%  SAE 0% 0%   - 3. COPD  COPD patients -data from IB April 2017 and PI meeting Spring 2017 Study drug Placebo  Efficacy  AECOPD and CAT scores better with drug after 1 year x  General No change in PMN, EKG, Vitals, Labs, Spirometry but produces mild to moderate headache and diarrhea  x  Any AE 52-77% 55-80%  Cystitis 5% at  bid 3%  Backpain 5% at  bid 0%  Nasopharyngitis 29% at  bid 33%  Headache 9% at  bid 8%  Upper abdominal pain 0% at  bid 6%  Infection rate 42% 50%  SAE 10 events 31 events   ........................................................................................................................................................................  I called the subject to discuss plans for her upcoming Visit 6 and to see how she is feeling with her recent exacerbation. [id number]. iwill await subjects call back.   Carron Curie, CMA Study Coordinator [phone]

## 2017-01-28 NOTE — Telephone Encounter (Signed)
Clinical Research Coordinator / Research RN note :  I called and spoke with the subject [phone] and discussed the unexpected issue of her work sending her out of town ([place] for an extended period of time. Due to this she will be out of window for Visit 6, and will not have enough IP to last until her return on April 13 th in the pm. I have been in contact with the study Monitor, Lawson Fiscal Repass, and she advised that we would need to complete Visit 6 and then bring the subject back the next day as well for an unscheduled visit to collect PK samples since the subject will be out of IP prior to her return. The subject states she will not be in town until 5:00pm on April 13th, but is agreeable to come 2 days in a row for the study requirements. I advised the subject that I will follow-up with the study monitor to finalize the requirements for the visits and I will call the subject back and confirm the appointment sates and time.   In regards to her recent AECOPD exacerbation, the subject states she has completed the antibiotics and prednisone taper. She states she feels improvement in her SOB, cough, and wheezing, but she does not feel like she is back to her baseline prior to exacerbation. I advised her to contact her primary pulmonary doctor if her symptoms do not completely resolve or begin to get worse. Also advised her to go to an urgent care or ER where she is currently located if needed. Subject states understanding.   Signed by  Carron Curie, CMA Clinical Research Coordinator  [facility]  [place], [place] 12:43 PM 01/28/2017

## 2017-01-29 NOTE — Telephone Encounter (Signed)
PI OVERSIGHT ATTESTATION  I the Principal Investigator (PI) for the above mentioned study attest that I reviewed the above mentioned  clinical research coordinato notes on research subject  Morgan Harper  born 1956/05/14 . I  agree with the findings mentioned above   Dr. Kalman Shan, M.D., F.C.C.P., ACRP-CPI Pulmonary and Critical Care Medicine Principal Investigator & Staff Physician [facility]  [place] Pulmonary and Critical Care Pager: [phone], If no answer or between  15:00h - 7:00h: call [phone]  01/29/2017 6:43 AM

## 2017-02-06 ENCOUNTER — Telehealth: Payer: Self-pay

## 2017-02-06 ENCOUNTER — Telehealth: Payer: Self-pay | Admitting: Internal Medicine

## 2017-02-06 NOTE — Telephone Encounter (Signed)
Received a [facility]ed Rx [facility] from [facility] on 02/06/2017 for Lisinopril 2.5mg . This isn't a current medication of the Patient's active list. It is however on the Patient's Historical list. The Lisinopril was discontinued on 06/27/2016. The Patient's current medication list indicates that she is taking Amlodipine .  Please advise.

## 2017-02-06 NOTE — Telephone Encounter (Signed)
I spoke with subject Morgan Harper with a DOB of January 13, 1956 in regards to scheduling her Visit 6 that will occur out of window on 02/08/17 due to subject traveling out of state for work and being unable to return for Visit 6 originally scheduled for 01/29/2017. Subject will be returning on 02/08/2017, flight is set to land at 5:15 pm in [place]. Subject will take last dose of IP this PM, 02/05/2017. Since the subject will be without IP x 3 days Visit 6 procedures, except for lab assessments, will be completed on 02/08/2017. The subject will then return on Monday 02/11/2017 in AM, after holding her IP the prior evening, to complete the required Visit 6 lab assessments. The subject is aware and agreeable with this plan. E-mail correspondence with study monitor regarding this plan is filed in the subjects study binder.  The importance of coming to scheduled visits for research study was again discussed with the subject. She stated understanding.   Carron Curie, CMA Clinical Research Coordinator [facility], [facility] 02/06/2017

## 2017-02-06 NOTE — Telephone Encounter (Signed)
Please refill her Lisinopril 2.5 mg tab, 1 tab po daily disp #30 with 5 rf

## 2017-02-07 ENCOUNTER — Other Ambulatory Visit: Payer: Self-pay

## 2017-02-07 MED ORDER — LISINOPRIL 2.5 MG PO TABS: 2.5000 mg | ORAL_TABLET | Freq: Every day | ORAL | 5 refills | Status: DC

## 2017-02-07 NOTE — Telephone Encounter (Signed)
Refills sent into [facility] on 02/07/2017.

## 2017-02-08 DIAGNOSIS — Z006 Encounter for examination for normal comparison and control in clinical research program: Secondary | ICD-10-CM

## 2017-02-08 DIAGNOSIS — J449 Chronic obstructive pulmonary disease, unspecified: Secondary | ICD-10-CM

## 2017-02-08 NOTE — Progress Notes (Signed)
Title: Randomised, Double-Blind (Sponsor Open), Placebo-Controlled, Multicentre, Dose Ranging Study to Evaluate the Efficacy and Safety of Danirixin Tablets Administered Twice Daily Compared With Placebo for 24 Weeks in Adults With COPD  Sponsor: [employer] , Protoocol Number: [id number], NCT Trial #: [id number]  Synopsis:  Following baseline assessments collected over a 7 day period participants will be randomized (1:1:1:1:1:1) to receive one of five dose strengths of danirixin (5 milligram [mg], 10 mg, 25 mg, 35 mg and 50 mg) or placebo. Study treatment will be administered orally twice daily for 24 weeks. Participants will continue with their standard of care inhaled medications during study. Follow up will continue up to 28 days post last dose  Key Inclusion Criteria: age 62-80, COPD with fev1 >/= 40%, ratio <0.7, >/= 2 copd exacerbations in past 1 year treated with either antibiotics or steroids, but >/= 14 days since last antibiotic or steroid for exacerbation, >/= 10 pack smoking history  Key Exclusion Criteria: non-COPD lung disease, pulse ox < 88% at rest on room air (i.e, restting o2 patients excluded), active phase of pulmonary rehabilitation (maintenance ok), previous lung surgery, QTc prolongation, HIV positive etc..   Key end points: Changes in respiratory symptoms and copd exacerbations and safety of DNX  Key features of DNX:   selective CXC chemokine receptor (CXCR2) antagonist . Positive trends in COPD exacerbation, symptoms and health status  Safety of DNX: As of April 2017 (5 completed Phase 1 studies, and 1 Phase 2A study of moderate copd patients with 45 patients):Marland Kitchen Most side effects are seen at  bid (current study max is  bid)  Phase 1 Normal Adults - dose </=  bid Study drug -    placebo  General All side effects mild - moderate. No changes in labs, EKG and vitals including PMN per PI meeting slide  x  Any adverse event 30% 13%  Headache 10-20% 0-17%   Fatigue/Lethargy 0-10% 0-3%  Abd Pain 0-11% (20% at  bid) 0%   Cough 0% 0%  Study withdrawal 0% 0%  SAE 0% 0%   - 3. COPD  COPD patients -data from IB April 2017 and PI meeting Spring 2017 Study drug Placebo  Efficacy  AECOPD and CAT scores better with drug after 1 year x  General No change in PMN, EKG, Vitals, Labs, Spirometry but produces mild to moderate headache and diarrhea  x  Any AE 52-77% 55-80%  Cystitis 5% at  bid 3%  Backpain 5% at  bid 0%  Nasopharyngitis 29% at  bid 33%  Headache 9% at  bid 8%  Upper abdominal pain 0% at  bid 6%  Infection rate 42% 50%  SAE 10 events 31 events   ........................................................................................................................................................................ Clinical Research Coordinator / Research RN note : This visit for Subject Morgan Harper with DOB: 12/05/1955 on 02/08/2017 for the above protocol is Visit # 6  and is for purpose of research . The consent for this encounter is under Protocol Version Amendment 1 and  is currently IRB approved. Subject expressed continued interest and consent in continuing as a study subject. Subject confirmed that there was  No change in contact information (e.g. address, telephone, email). Subject thanked for participation in research and contribution to science.  Visit 6 was performed out of window due to the subjects travel schedule for her work which required her to travel out of state for an extended period of time. Due to the subjects unexpected travel, she did not have enough IP to last until her return.  Her last dose was on 10/APR/18 . Due to the subject being without IP for extended period of time, all procedures except the PK sampling and lab assessments were performed on 13Apr/2018 per protocol requirements.  On Visit 5, conducted on 23/Feb/2018, the subject was dispensed 2 bottles of IP due to possible travel,  however on Visit 6 the subject returned only 1 empty bottle. She states she will check in her cabinet for the bottle and return to the site if found.  Subject has been scheduled to return on 16/Apr/18 for Visit 6 PK sampling and laboratory assessments. Subject instructed to hold morning dose of IP on 16/Apr/18. Subject stated understanding.   Since Visit 5 the subject experienced an AECOPD. Symptoms began on 19/Mar/18. Subject was prescribed prednisone and zpak on 27/Mar/18. Subject completed both 5 day courses as prescribed. Today, subject states she still does not feel back to baseline. She states what is keeping her from feeling back to  baseline is the increased SOB with minimal activity that is still occurring. A message has been sent to the subjects primary Pulmonologist to address the continuing SOB. Subject advised to seek care at urgent care or ER if SOB gets worse.   During the visit the PI, Dr. Kalman Shan, was present and discussed with the subject her desire to continue participation in the study. He advised the subject that participation is completely voluntary and her relationship with her physician will remain intact whether or not she continues participating in the study. The importance of compliance with IP and stud required visits was discussed with the subject. The subject expressed understanding and expressed the desire to continue participating in the study.   Refer to the subjects paper source study binder for further documentation.     Signed by  Carron Curie, CMA Clinical Research Coordinator  [facility]  [place], [place] 6:05 PM 02/08/2017

## 2017-02-11 ENCOUNTER — Telehealth: Payer: Self-pay | Admitting: Internal Medicine

## 2017-02-11 DIAGNOSIS — J449 Chronic obstructive pulmonary disease, unspecified: Secondary | ICD-10-CM

## 2017-02-11 DIAGNOSIS — Z006 Encounter for examination for normal comparison and control in clinical research program: Secondary | ICD-10-CM

## 2017-02-11 NOTE — Telephone Encounter (Signed)
This is a standard of care message.   The patient presented for a research visit today and mentioned to the study coordinator that over the last month she has begun to smoke again and is requesting a prescription for Wellbutrin. Patient states she has used this in the past and it helped her quit at that time. Patient states her insurance will not cover Chantix.   Please advise.  No Known Allergies   Pharmacy: [facility]  [place], [place]

## 2017-02-11 NOTE — Progress Notes (Signed)
Title: Randomised, Double-Blind (Sponsor Open), Placebo-Controlled, Multicentre, Dose Ranging Study to Evaluate the Efficacy and Safety of Danirixin Tablets Administered Twice Daily Compared With Placebo for 24 Weeks in Adults With COPD  Sponsor: [employer] , Protoocol Number: [id number], NCT Trial #: [id number]  Synopsis:  Following baseline assessments collected over a 7 day period participants will be randomized (1:1:1:1:1:1) to receive one of five dose strengths of danirixin (5 milligram [mg], 10 mg, 25 mg, 35 mg and 50 mg) or placebo. Study treatment will be administered orally twice daily for 24 weeks. Participants will continue with their standard of care inhaled medications during study. Follow up will continue up to 28 days post last dose  Key Inclusion Criteria: age 79-80, COPD with fev1 >/= 40%, ratio <0.7, >/= 2 copd exacerbations in past 1 year treated with either antibiotics or steroids, but >/= 14 days since last antibiotic or steroid for exacerbation, >/= 10 pack smoking history  Key Exclusion Criteria: non-COPD lung disease, pulse ox < 88% at rest on room air (i.e, restting o2 patients excluded), active phase of pulmonary rehabilitation (maintenance ok), previous lung surgery, QTc prolongation, HIV positive etc..   Key end points: Changes in respiratory symptoms and copd exacerbations and safety of DNX  Key features of DNX:   selective CXC chemokine receptor (CXCR2) antagonist . Positive trends in COPD exacerbation, symptoms and health status  Safety of DNX: As of April 2017 (5 completed Phase 1 studies, and 1 Phase 2A study of moderate copd patients with 45 patients):Marland Kitchen Most side effects are seen at  bid (current study max is  bid)  Phase 1 Normal Adults - dose </=  bid Study drug -    placebo  General All side effects mild - moderate. No changes in labs, EKG and vitals including PMN per PI meeting slide  x  Any adverse event 30% 13%  Headache 10-20% 0-17%   Fatigue/Lethargy 0-10% 0-3%  Abd Pain 0-11% (20% at  bid) 0%   Cough 0% 0%  Study withdrawal 0% 0%  SAE 0% 0%   - 3. COPD  COPD patients -data from IB April 2017 and PI meeting Spring 2017 Study drug Placebo  Efficacy  AECOPD and CAT scores better with drug after 1 year x  General No change in PMN, EKG, Vitals, Labs, Spirometry but produces mild to moderate headache and diarrhea  x  Any AE 52-77% 55-80%  Cystitis 5% at  bid 3%  Backpain 5% at  bid 0%  Nasopharyngitis 29% at  bid 33%  Headache 9% at  bid 8%  Upper abdominal pain 0% at  bid 6%  Infection rate 42% 50%  SAE 10 events 31 events   ........................................................................................................................................................................ Clinical Research Coordinator / Research RN note : This visit for Subject Morgan Harper with DOB: [birth year] on 02/11/2017 for the above protocol is Visit#6 lab assessments and is for purpose of research . The consent for this encounter is under Protocol Version Amendment 1 and is currently IRB approved. Subjectexpressed continued interest and consent in continuing as a study subject. Subject confirmed that there was no change in contact information (e.g. address, telephone, email). Subject thanked for participation in research and contribution to science.   This visit was for the purpose of completion of Visit 6 laboratory assessments. Refer to progress note from [id number] for further details on other visit procedures and assessments performed. After the lab assessments were performed the subject took her AM dose of IP on site with this coordinator present. IP  was taken with crackers and water. This coordinator discussed all requirements for Visit 7 including placing the Actigraph on 7 days prior to the visit. Subject documented the requirements in her phone. This coordinator also reminded the subject of the  importance of coming to the visit as scheduled. Subject stated understanding.   Subject mentioned during visit that she has been struggling with smoking again. She states she has been smoking 2 cigarettes a day again for around 4 weeks and is requesting recommendations for quitting. A message has been placed to the subjects primary pulmonary doctor to address this concern.  Refer to the subjects paper source binder for further documentation.   Signed by Carron Curie, CMA Clinical Research Coordinator  [facility]  [place], [place] 9:32 AM 02/11/2017

## 2017-02-13 MED ORDER — BUPROPION HCL ER (SR) 150 MG PO TB12: ORAL_TABLET | ORAL | 3 refills | Status: DC

## 2017-02-13 NOTE — Telephone Encounter (Signed)
Triage  Please call in for this patient Morgan Harper   STart wellbutrin:   Initial: 150 mg once daily for 3 days; increase to 150 mg twice daily treatment which you should continue for 12 weeks  Also, one week after your start wellbutrin quit smoking

## 2017-02-13 NOTE — Telephone Encounter (Signed)
Rx for Wellbutrin was sent

## 2017-02-13 NOTE — Telephone Encounter (Signed)
Patient is out of town so prescription sent to [facility] in [place] per patients request. Carron Curie, CMA

## 2017-02-13 NOTE — Addendum Note (Signed)
Addended by: Darrell Jewel on: 02/13/2017 04:09 PM   Modules accepted: Orders

## 2017-02-14 ENCOUNTER — Other Ambulatory Visit: Payer: Self-pay | Admitting: Internal Medicine

## 2017-02-25 ENCOUNTER — Ambulatory Visit (INDEPENDENT_AMBULATORY_CARE_PROVIDER_SITE_OTHER): Payer: BLUE CROSS/BLUE SHIELD | Admitting: Internal Medicine

## 2017-02-25 ENCOUNTER — Encounter: Payer: Self-pay | Admitting: Internal Medicine

## 2017-02-25 VITALS — BP 146/90 | HR 81 | Ht 62.0 in | Wt 158.4 lb

## 2017-02-25 VITALS — BP 147/90 | HR 81 | Resp 14

## 2017-02-25 DIAGNOSIS — J449 Chronic obstructive pulmonary disease, unspecified: Secondary | ICD-10-CM

## 2017-02-25 DIAGNOSIS — Z006 Encounter for examination for normal comparison and control in clinical research program: Secondary | ICD-10-CM

## 2017-02-25 DIAGNOSIS — J441 Chronic obstructive pulmonary disease with (acute) exacerbation: Secondary | ICD-10-CM | POA: Diagnosis not present

## 2017-02-25 MED ORDER — METHYLPREDNISOLONE ACETATE 80 MG/ML IJ SUSP
80.0000 mg | Freq: Once | INTRAMUSCULAR | Status: AC
  Administered 2017-02-25: 80 mg via INTRAMUSCULAR

## 2017-02-25 MED ORDER — PREDNISONE 10 MG PO TABS: ORAL_TABLET | ORAL | 0 refills | Status: DC

## 2017-02-25 MED ORDER — ALBUTEROL SULFATE (2.5 MG/3ML) 0.083% IN NEBU: 2.5000 mg | INHALATION_SOLUTION | Freq: Four times a day (QID) | RESPIRATORY_TRACT | 12 refills | Status: DC | PRN

## 2017-02-25 NOTE — Patient Instructions (Addendum)
Order- depo 80    Dx COPD exacerbation  Scripts sent refilling albuterol nebulizer solution and for long prednisone taper  If the albuterol given with spirometry this morning isn't enough, you can have a xopenex 0.63 neb treatment  Please call as needed  Follow with Dr Marchelle Gearing as directed

## 2017-02-25 NOTE — Progress Notes (Signed)
02/25/17-61 year old female current smoker brought over from [facility] Research visit because she presented with acute exacerbation. ACUTE VISIT: MR pt. Increased SOB, cough-non productive, wheezing-started getting worse yesterday CXR 12/19/16 IMPRESSION: Bronchitis pattern.  No consolidation or collapse. She had flown back from [place] 3 days ago. Starting yesterday she had nasal congestion and symptoms as listed. She denies fever, sore throat, purulent discharge and does not feel she has a cold. Says she usually needs  an extended prednisone taper with last prednisone in March. She is flying back to [place] tomorrow for a month.  ROS-see HPI   Negative unless "+" Constitutional:    weight loss, night sweats, fevers, chills, fatigue, lassitude. HEENT:    headaches, difficulty swallowing, tooth/dental problems, sore throat,       sneezing, itching, ear ache, +nasal congestion, post nasal drip, snoring CV:    chest pain, orthopnea, PND, swelling in lower extremities, anasarca,                                                     dizziness, palpitations Resp:   shortness of breath with exertion or at rest.               +productive cough,   non-productive cough, coughing up of blood.              change in color of mucus.  +wheezing.   Skin:    rash or lesions. GI:  No-   heartburn, indigestion, abdominal pain, nausea, vomiting, diarrhea,                 change in bowel habits, loss of appetite GU: dysuria, change in color of urine, no urgency or frequency.   flank pain. MS:   joint pain, stiffness, decreased range of motion, back pain. Neuro-     nothing unusual Psych:  change in mood or affect.  depression or anxiety.   memory loss.  OBJ- Physical Exam General- Alert, Oriented, Affect-appropriate, Distress- none acute Skin- rash-none, lesions- none, excoriation- none Lymphadenopathy- none Head- atraumatic            Eyes- Gross vision intact, PERRLA, conjunctivae and secretions clear           Ears- Hearing, canals-normal            Nose- Clear, no-Septal dev, mucus, polyps, erosion, perforation             Throat- Mallampati II , mucosa clear , drainage- none, tonsils- atrophic Neck- flexible , trachea midline, no stridor , thyroid nl, carotid no bruit Chest - symmetrical excursion , unlabored           Heart/CV- RRR , no murmur , no gallop  , no rub, nl s1 s2                           - JVD- none , edema- none, stasis changes- none, varices- none           Lung-  wheeze+ extended expiratory, unlabored but overinflated, cough- none , dullness-none, rub- none           Chest wall-  Abd-  Br/ Gen/ Rectal- Not done, not indicated Extrem- cyanosis- none, clubbing, none, atrophy- none, strength- nl Neuro- grossly intact to observation

## 2017-02-25 NOTE — Progress Notes (Signed)
Title: Randomised, Double-Blind (Sponsor Open), Placebo-Controlled, Multicentre, Dose Ranging Study to Evaluate the Efficacy and Safety of Danirixin Tablets Administered Twice Daily Compared With Placebo for 24 Weeks in Adults With COPD  Sponsor: [employer] , Protoocol Number: [id number], NCT Trial #: [id number]  Synopsis:  Following baseline assessments collected over a 7 day period participants will be randomized (1:1:1:1:1:1) to receive one of five dose strengths of danirixin (5 milligram [mg], 10 mg, 25 mg, 35 mg and 50 mg) or placebo. Study treatment will be administered orally twice daily for 24 weeks. Participants will continue with their standard of care inhaled medications during study. Follow up will continue up to 28 days post last dose  Key Inclusion Criteria: age 20-80, COPD with fev1 >/= 40%, ratio <0.7, >/= 2 copd exacerbations in past 1 year treated with either antibiotics or steroids, but >/= 14 days since last antibiotic or steroid for exacerbation, >/= 10 pack smoking history  Key Exclusion Criteria: non-COPD lung disease, pulse ox < 88% at rest on room air (i.e, restting o2 patients excluded), active phase of pulmonary rehabilitation (maintenance ok), previous lung surgery, QTc prolongation, HIV positive etc..   Key end points: Changes in respiratory symptoms and copd exacerbations and safety of DNX  Key features of DNX:   selective CXC chemokine receptor (CXCR2) antagonist . Positive trends in COPD exacerbation, symptoms and health status  Safety of DNX: As of April 2017 (5 completed Phase 1 studies, and 1 Phase 2A study of moderate copd patients with 45 patients):Marland Kitchen Most side effects are seen at  bid (current study max is  bid)  Phase 1 Normal Adults - dose </=  bid Study drug -    placebo  General All side effects mild - moderate. No changes in labs, EKG and vitals including PMN per PI meeting slide  x  Any adverse event 30% 13%  Headache 10-20% 0-17%   Fatigue/Lethargy 0-10% 0-3%  Abd Pain 0-11% (20% at  bid) 0%   Cough 0% 0%  Study withdrawal 0% 0%  SAE 0% 0%   - 3. COPD  COPD patients -data from IB April 2017 and PI meeting Spring 2017 Study drug Placebo  Efficacy  AECOPD and CAT scores better with drug after 1 year x  General No change in PMN, EKG, Vitals, Labs, Spirometry but produces mild to moderate headache and diarrhea  x  Any AE 52-77% 55-80%  Cystitis 5% at  bid 3%  Backpain 5% at  bid 0%  Nasopharyngitis 29% at  bid 33%  Headache 9% at  bid 8%  Upper abdominal pain 0% at  bid 6%  Infection rate 42% 50%  SAE 10 events 31 events   ........................................................................................................................................................................ Clinical Research Coordinator / Research RN note : This visit for Subject Morgan Harper with DOB: [birth year] on 02/25/2017 for the above protocol is Visit/Encounter # Early Withdrawal and is for purpose of research. The consent for this encounter is under Protocol Version amendment 1 and is currently IRB approved.  Subject thanked for participation in research and contribution to science.   Subject expressed wanting to withdrawal from participating in the study due to the travel constraints with her job. Subject states that she does NOTwish to withdrawal consent for disclosure of future information, and the subject states that she does NOT wish to request the destruction of any samples taken during the study.  AECOPD  with start date of 19/Mar/2018 has resolved per subject on 17/Apr/2018.  On 30/Apr/2018 the subject c/o having increased  SOB, increased cough, and wheezing since 29/Apr/2018. See Acute Visit note documentation from Dr. Jetty Duhamel.  Per PI, Dr. Marchelle Gearing, this AECOPD is an AE that is moderate, not related to study drug, and is ongoing.   At Visit 6, subject mentioned that she began smoking  again "a few weeks prior" 2 cigarettes a day. PI made aware.   Study medication collected and logged. Subject was unable to find the empty IP bottle that she did not return on Visit 6. This is bottle # [id number], and per records and subjects report there was not any study drug left in the bottle.  E-diary LogPad, Propeller MDI sensor, Activity Monitor and accessories were all collected from the subject.  The subject opted to withdrawal from the study and decided to withdrawal from participating in the Activity Monitor Sub-study as well.     In this visit 02/25/2017 the subject will be evaluated by investigator named Dr. Marchelle Gearing . This research coordinator has verified that the investigator is uptodate with his training logs.  Please refer to the subjects paper source binder for further documentation.   Signed by  Carron Curie, CMA Clinical Research Coordinator  [facility]  [place], [place] 1:61 PM 02/25/2017

## 2017-02-25 NOTE — Assessment & Plan Note (Signed)
Nonspecific acute exacerbation-viral and/or allergic exposures. Not a bacterial infection She has her meds but needs refills for nebulizer solution and rescue inhaler. Plan-refills as requested. Depo-Medrol today. Prescription for prolonged prednisone taper as discussed. She will return to Dr. Marchelle Gearing for follow-up as planned.

## 2017-02-25 NOTE — Progress Notes (Signed)
Title: Randomised, Double-Blind (Sponsor Open), Placebo-Controlled, Multicentre, Dose Ranging Study to Evaluate the Efficacy and Safety of Danirixin Tablets Administered Twice Daily Compared With Placebo for 24 Weeks in Adults With COPD  Sponsor: [employer] , Protoocol Number: [id number], NCT Trial #: [id number]  Synopsis:  Following baseline assessments collected over a 7 day period participants will be randomized (1:1:1:1:1:1) to receive one of five dose strengths of danirixin (5 milligram [mg], 10 mg, 25 mg, 35 mg and 50 mg) or placebo. Study treatment will be administered orally twice daily for 24 weeks. Participants will continue with their standard of care inhaled medications during study. Follow up will continue up to 28 days post last dose  Key Inclusion Criteria: age 33-80, COPD with fev1 >/= 40%, ratio <0.7, >/= 2 copd exacerbations in past 1 year treated with either antibiotics or steroids, but >/= 14 days since last antibiotic or steroid for exacerbation, >/= 10 pack smoking history  Key Exclusion Criteria: non-COPD lung disease, pulse ox < 88% at rest on room air (i.e, restting o2 patients excluded), active phase of pulmonary rehabilitation (maintenance ok), previous lung surgery, QTc prolongation, HIV positive etc..   Key end points: Changes in respiratory symptoms and copd exacerbations and safety of DNX  Key features of DNX:   selective CXC chemokine receptor (CXCR2) antagonist . Positive trends in COPD exacerbation, symptoms and health status  Safety of DNX: As of April 2017 (5 completed Phase 1 studies, and 1 Phase 2A study of moderate copd patients with 45 patients):Marland Kitchen Most side effects are seen at  bid (current study max is  bid)  Phase 1 Normal Adults - dose </=  bid Study drug -    placebo  General All side effects mild - moderate. No changes in labs, EKG and vitals including PMN per PI meeting slide  x  Any adverse event 30% 13%  Headache 10-20% 0-17%   Fatigue/Lethargy 0-10% 0-3%  Abd Pain 0-11% (20% at  bid) 0%   Cough 0% 0%  Study withdrawal 0% 0%  SAE 0% 0%   - 3. COPD  COPD patients -data from IB April 2017 and PI meeting Spring 2017 Study drug Placebo  Efficacy  AECOPD and CAT scores better with drug after 1 year x  General No change in PMN, EKG, Vitals, Labs, Spirometry but produces mild to moderate headache and diarrhea  x  Any AE 52-77% 55-80%  Cystitis 5% at  bid 3%  Backpain 5% at  bid 0%  Nasopharyngitis 29% at  bid 33%  Headache 9% at  bid 8%  Upper abdominal pain 0% at  bid 6%  Infection rate 42% 50%  SAE 10 events 31 events   ........................................................................................................................................................................   S: This is a research visit is supposed to have been wek  12 Or Day 84 visit. However instead it is going to an early withdrawal visit. Recently had a conversation with the patient that she was having visits out of the window. She has taken off her job in [place] part time. This is stating a lot of logistic difficulties. She therefore has decided on her own voluntary free will to withdraw from the study participation early. She however is giving consent for her samples biological and has study dated to be used by the sponsor. This pertains to data so far. She is willing to come for follow-up within the next 28 days.  Of note, she had an exacerbation in March 2018 which she feels resolved by mid-April 2018. Then starting  yesterday she's had another exacerbation after returning from [place]. She thinks that the weather change issue. She does admit to some mildew in the house. She says she's compliant with her standard of care asthma inhaler therapy  O Vitals:   02/25/17 0946  BP: (!) 147/90  Pulse: 81  Resp: 14  SpO2: 98%   Most important thing was significant bilateral diffuse wheezing   A Primary:  Research subject: Early withdrawal Secondary: COPD exacerbation which would be an adverse events. She'll be referred to standard of care treating nurse practitioner right away for evaluation    P For research return for follow-up within the next 28 days   Dr. Kalman Shan, M.D., [facility].C.P Pulmonary and Critical Care Medicine Staff Physician [facility] [place] [facility] Pager: [phone], If no answer or between  15:00h - 7:00h: call [phone]  02/25/2017 10:13 AM

## 2017-03-19 ENCOUNTER — Telehealth: Payer: Self-pay | Admitting: Internal Medicine

## 2017-03-19 NOTE — Telephone Encounter (Signed)
RESEARCH Phone note: I attempted to call the subject, Morgan Harper, to schedule follow-up appointment for [employer] [id number] study. I left a message on the subjects voicemail requesting a call back to [phone].   [name], CMA, Research Assistant Clinic Research Coordinator

## 2017-03-22 ENCOUNTER — Ambulatory Visit (INDEPENDENT_AMBULATORY_CARE_PROVIDER_SITE_OTHER): Payer: BLUE CROSS/BLUE SHIELD | Admitting: Internal Medicine

## 2017-03-22 DIAGNOSIS — Z006 Encounter for examination for normal comparison and control in clinical research program: Secondary | ICD-10-CM | POA: Diagnosis not present

## 2017-03-22 DIAGNOSIS — J449 Chronic obstructive pulmonary disease, unspecified: Secondary | ICD-10-CM | POA: Diagnosis not present

## 2017-03-22 NOTE — Progress Notes (Addendum)
Title: Randomised, Double-Blind (Sponsor Open), Placebo-Controlled, Multicentre, Dose Ranging Study to Evaluate the Efficacy and Safety of Danirixin Tablets Administered Twice Daily Compared With Placebo for 24 Weeks in Adults With COPD  Sponsor: [employer] , Protoocol Number: [id number], NCT Trial #: [id number]  Synopsis:  Following baseline assessments collected over a 7 day period participants will be randomized (1:1:1:1:1:1) to receive one of five dose strengths of danirixin (5 milligram [mg], 10 mg, 25 mg, 35 mg and 50 mg) or placebo. Study treatment will be administered orally twice daily for 24 weeks. Participants will continue with their standard of care inhaled medications during study. Follow up will continue up to 28 days post last dose  Key Inclusion Criteria: age 62-80, COPD with fev1 >/= 40%, ratio <0.7, >/= 2 copd exacerbations in past 1 year treated with either antibiotics or steroids, but >/= 14 days since last antibiotic or steroid for exacerbation, >/= 10 pack smoking history  Key Exclusion Criteria: non-COPD lung disease, pulse ox < 88% at rest on room air (i.e, restting o2 patients excluded), active phase of pulmonary rehabilitation (maintenance ok), previous lung surgery, QTc prolongation, HIV positive etc..   Key end points: Changes in respiratory symptoms and copd exacerbations and safety of DNX  Key features of DNX:   selective CXC chemokine receptor (CXCR2) antagonist . Positive trends in COPD exacerbation, symptoms and health status  Safety of DNX: As of April 2017 (5 completed Phase 1 studies, and 1 Phase 2A study of moderate copd patients with 45 patients):Marland Kitchen Most side effects are seen at 100mg  bid (current study max is 50mg  bid)  Phase 1 Normal Adults - dose </= 50mg  bid Study drug -    placebo  General All side effects mild - moderate. No changes in labs, EKG and vitals including PMN per PI meeting slide  x  Any adverse event 30% 13%  Headache 10-20% 0-17%   Fatigue/Lethargy 0-10% 0-3%  Abd Pain 0-11% (20% at 200mg  bid) 0%   Cough 0% 0%  Study withdrawal 0% 0%  SAE 0% 0%   - 3. COPD  COPD patients -data from IB April 2017 and PI meeting Spring 2017 Study drug Placebo  Efficacy  AECOPD and CAT scores better with drug after 1 year x  General No change in PMN, EKG, Vitals, Labs, Spirometry but produces mild to moderate headache and diarrhea  x  Any AE 52-77% 55-80%  Cystitis 5% at 75mg  bid 3%  Backpain 5% at 75mg  bid 0%  Nasopharyngitis 29% at 75mg  bid 33%  Headache 9% at 75mg  bid 8%  Upper abdominal pain 0% at 75mg  bid 6%  Infection rate 42% 50%  SAE 10 events 31 events   ........................................................................................................................................................................ PI NOTE - done by Dr Marchelle Gearing  Is below - this below note is not by Carron Curie  S: End of Study visit. Last study drug 24 Feb 2017. Last visit for early withdrawal was 25 February 2017. Tody 03/22/2017 is end of study visit. No interim issues with copd. Is stable.No AE. No SAE   O Exam non focal and in source  Labs Labs from prior visit reviewed -mild eos, mild anemia and high CRP + -> all baseline past hx   A   ICD-9-CM ICD-10-CM   1. Research subject V70.7 Z00.6   2. Stage 2 moderate COPD by GOLD classification (HCC) 496 J44.9     P Thanked for study participation Will schedule SOC COPD visit with her primary pulmonary doc - which is myself  Dr. Kalman ShanMurali Ramaswamy, M.D., [facility].C.P Pulmonary and Critical Care Medicine Staff Physician [facility] [place] [facility] Pager: [phone], If no answer or between  15:00h - 7:00h: call [phone]  03/22/2017 6:10 PM

## 2017-03-22 NOTE — Progress Notes (Signed)
Title: Randomised, Double-Blind (Sponsor Open), Placebo-Controlled, Multicentre, Dose Ranging Study to Evaluate the Efficacy and Safety of Danirixin Tablets Administered Twice Daily Compared With Placebo for 24 Weeks in Adults With COPD  Sponsor: [employer] , Protoocol Number: [id number], NCT Trial #: [id number]  Synopsis:  Following baseline assessments collected over a 7 day period participants will be randomized (1:1:1:1:1:1) to receive one of five dose strengths of danirixin (5 milligram [mg], 10 mg, 25 mg, 35 mg and 50 mg) or placebo. Study treatment will be administered orally twice daily for 24 weeks. Participants will continue with their standard of care inhaled medications during study. Follow up will continue up to 28 days post last dose  Key Inclusion Criteria: age 76-80, COPD with fev1 >/= 40%, ratio <0.7, >/= 2 copd exacerbations in past 1 year treated with either antibiotics or steroids, but >/= 14 days since last antibiotic or steroid for exacerbation, >/= 10 pack smoking history  Key Exclusion Criteria: non-COPD lung disease, pulse ox < 88% at rest on room air (i.e, restting o2 patients excluded), active phase of pulmonary rehabilitation (maintenance ok), previous lung surgery, QTc prolongation, HIV positive etc..   Key end points: Changes in respiratory symptoms and copd exacerbations and safety of DNX  Key features of DNX:   selective CXC chemokine receptor (CXCR2) antagonist . Positive trends in COPD exacerbation, symptoms and health status  Safety of DNX: As of April 2017 (5 completed Phase 1 studies, and 1 Phase 2A study of moderate copd patients with 45 patients):Marland Kitchen Most side effects are seen at 100mg  bid (current study max is 50mg  bid)  Phase 1 Normal Adults - dose </= 50mg  bid Study drug -    placebo  General All side effects mild - moderate. No changes in labs, EKG and vitals including PMN per PI meeting slide  x  Any adverse event 30% 13%  Headache 10-20% 0-17%   Fatigue/Lethargy 0-10% 0-3%  Abd Pain 0-11% (20% at 200mg  bid) 0%   Cough 0% 0%  Study withdrawal 0% 0%  SAE 0% 0%   - 3. COPD  COPD patients -data from IB April 2017 and PI meeting Spring 2017 Study drug Placebo  Efficacy  AECOPD and CAT scores better with drug after 1 year x  General No change in PMN, EKG, Vitals, Labs, Spirometry but produces mild to moderate headache and diarrhea  x  Any AE 52-77% 55-80%  Cystitis 5% at 75mg  bid 3%  Backpain 5% at 75mg  bid 0%  Nasopharyngitis 29% at 75mg  bid 33%  Headache 9% at 75mg  bid 8%  Upper abdominal pain 0% at 75mg  bid 6%  Infection rate 42% 50%  SAE 10 events 31 events   ........................................................................................................................................................................  Clinical Research Coordinator / Research RN note : This visit for Subject Morgan Harper with DOB: 1955/12/17 on 03/22/2017 for the above protocol is Visit/Encounter #Safety Follow-Up/EOS and is for purpose of research. The consent for this encounter is under Protocol Version Amendment 1and  Is currently IRB approved. Subject expressed continued interest and consent in continuing as a study subject. Subject confirmed that there was no change in contact information (e.g. address, telephone, email). Subject thanked for participation in research and contribution to science.   All procedures completed per above mentioned protocol. Subject was provided the" Informed Consent Form-Additional Privacy Notice to the Informed Consent Form you signed before taking part in the Danrixin Dose Ranging Study in Participants with COPD" for review. Subject did not have any questions regarding the notice.  Subject states she felt back to baseline after the prior COPD exacerbation on 09/May/2018. Refer to the subjects paper source binder for further documentation.   In this visit 03/22/2017 the subject will be evaluated by  investigator named Dr. Marchelle Gearingamaswamy . This Designer, industrial/productresearch coordinator has verified that the investigator is uptodate with his/her training logs  Signed by Morgan CurieJennifer Audel Harper, CMA, Research Assistant Clinical Research Coordinator  [facility]  [place], [place] 1:616:24 PM 03/22/2017

## 2017-03-22 NOTE — Patient Instructions (Signed)
ICD-9-CM ICD-10-CM   1. Research subject V70.7 Z00.6   2. Stage 2 moderate COPD by GOLD classification (HCC) 496 J44.9    Will schedule SOC visit in few months

## 2017-04-11 IMAGING — DX DG CHEST 2V
2 series · 2 of 2 positions shown · non-contrast
Comparison: Radiograph dated 07/24/2015

CLINICAL DATA: 59-year-old female with shortness of breath and
productive cough

EXAM:
CHEST  2 VIEW

[chest pa]
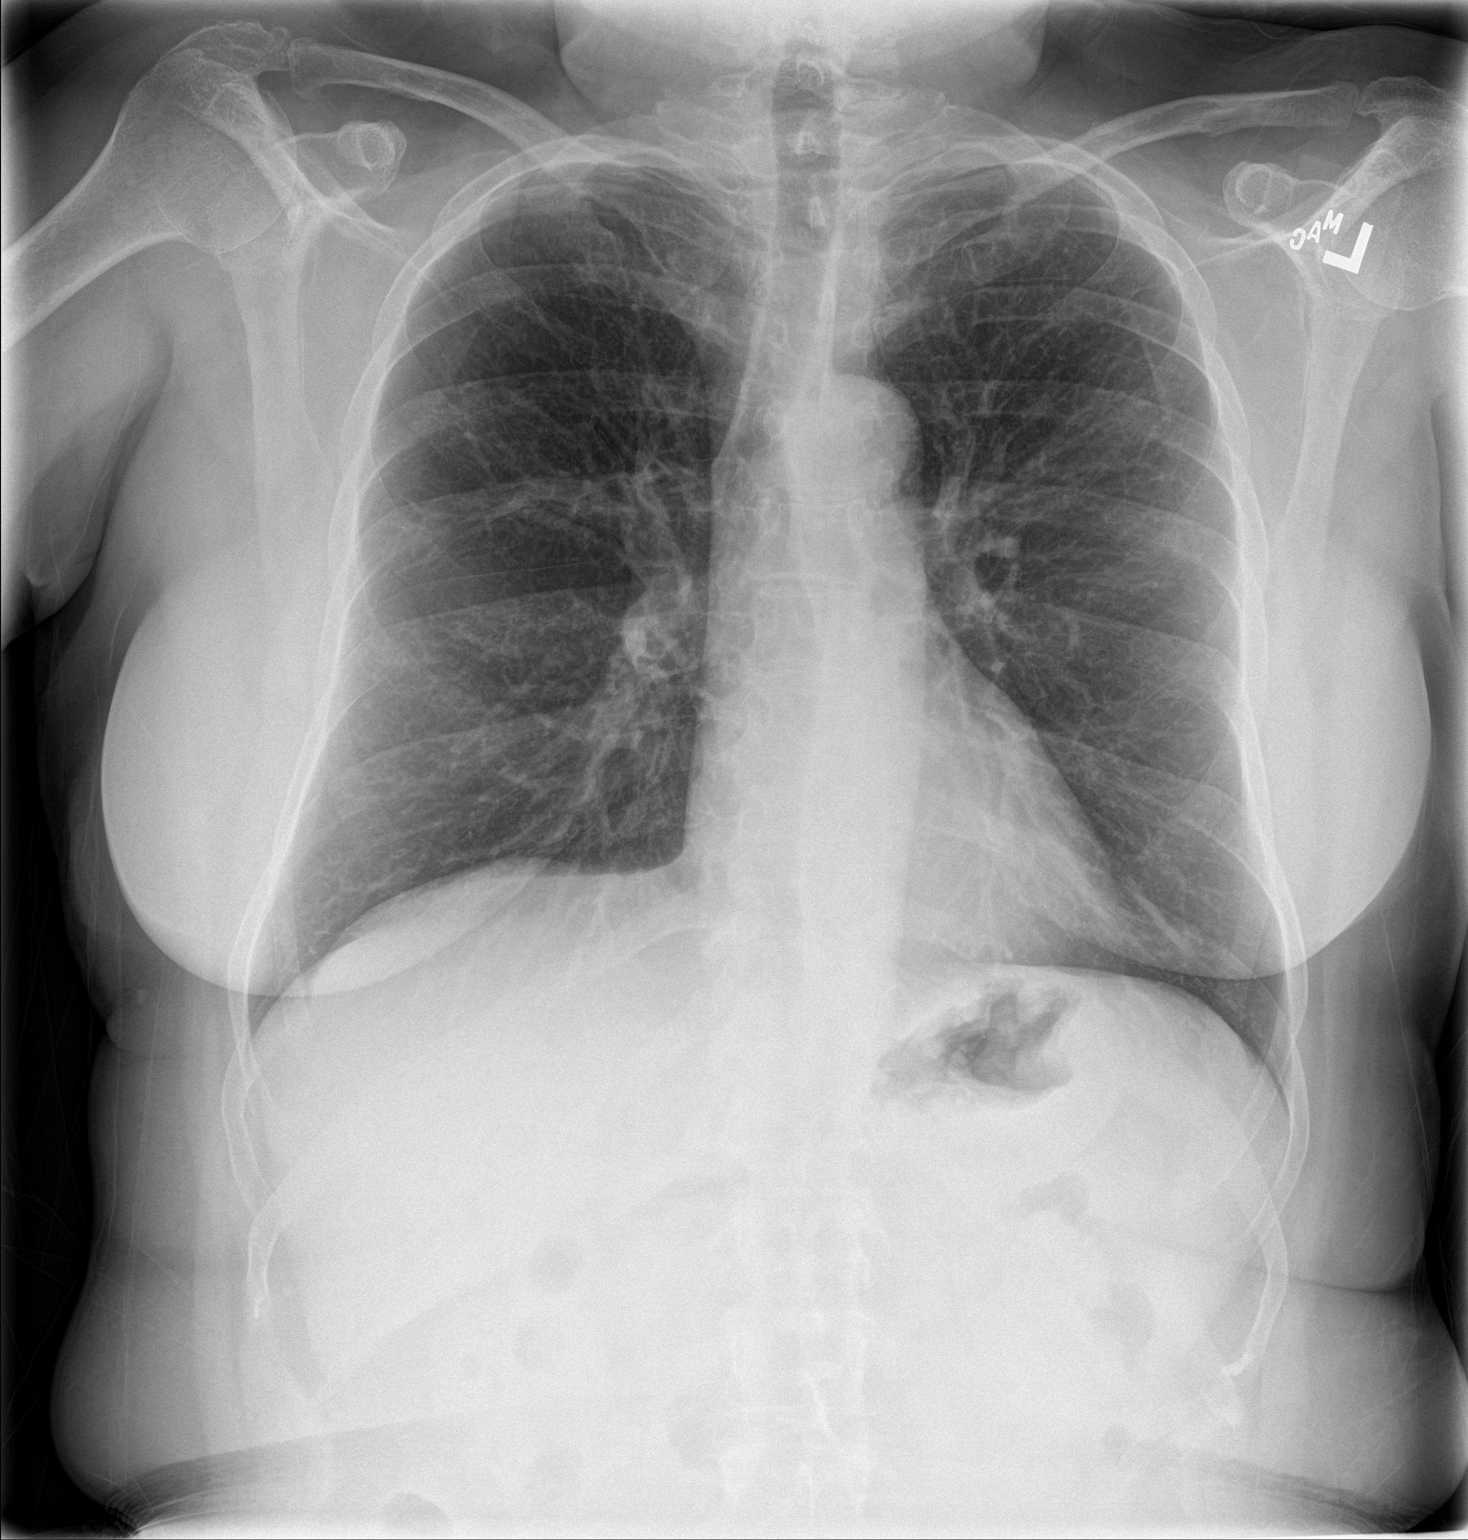

[chest lat]
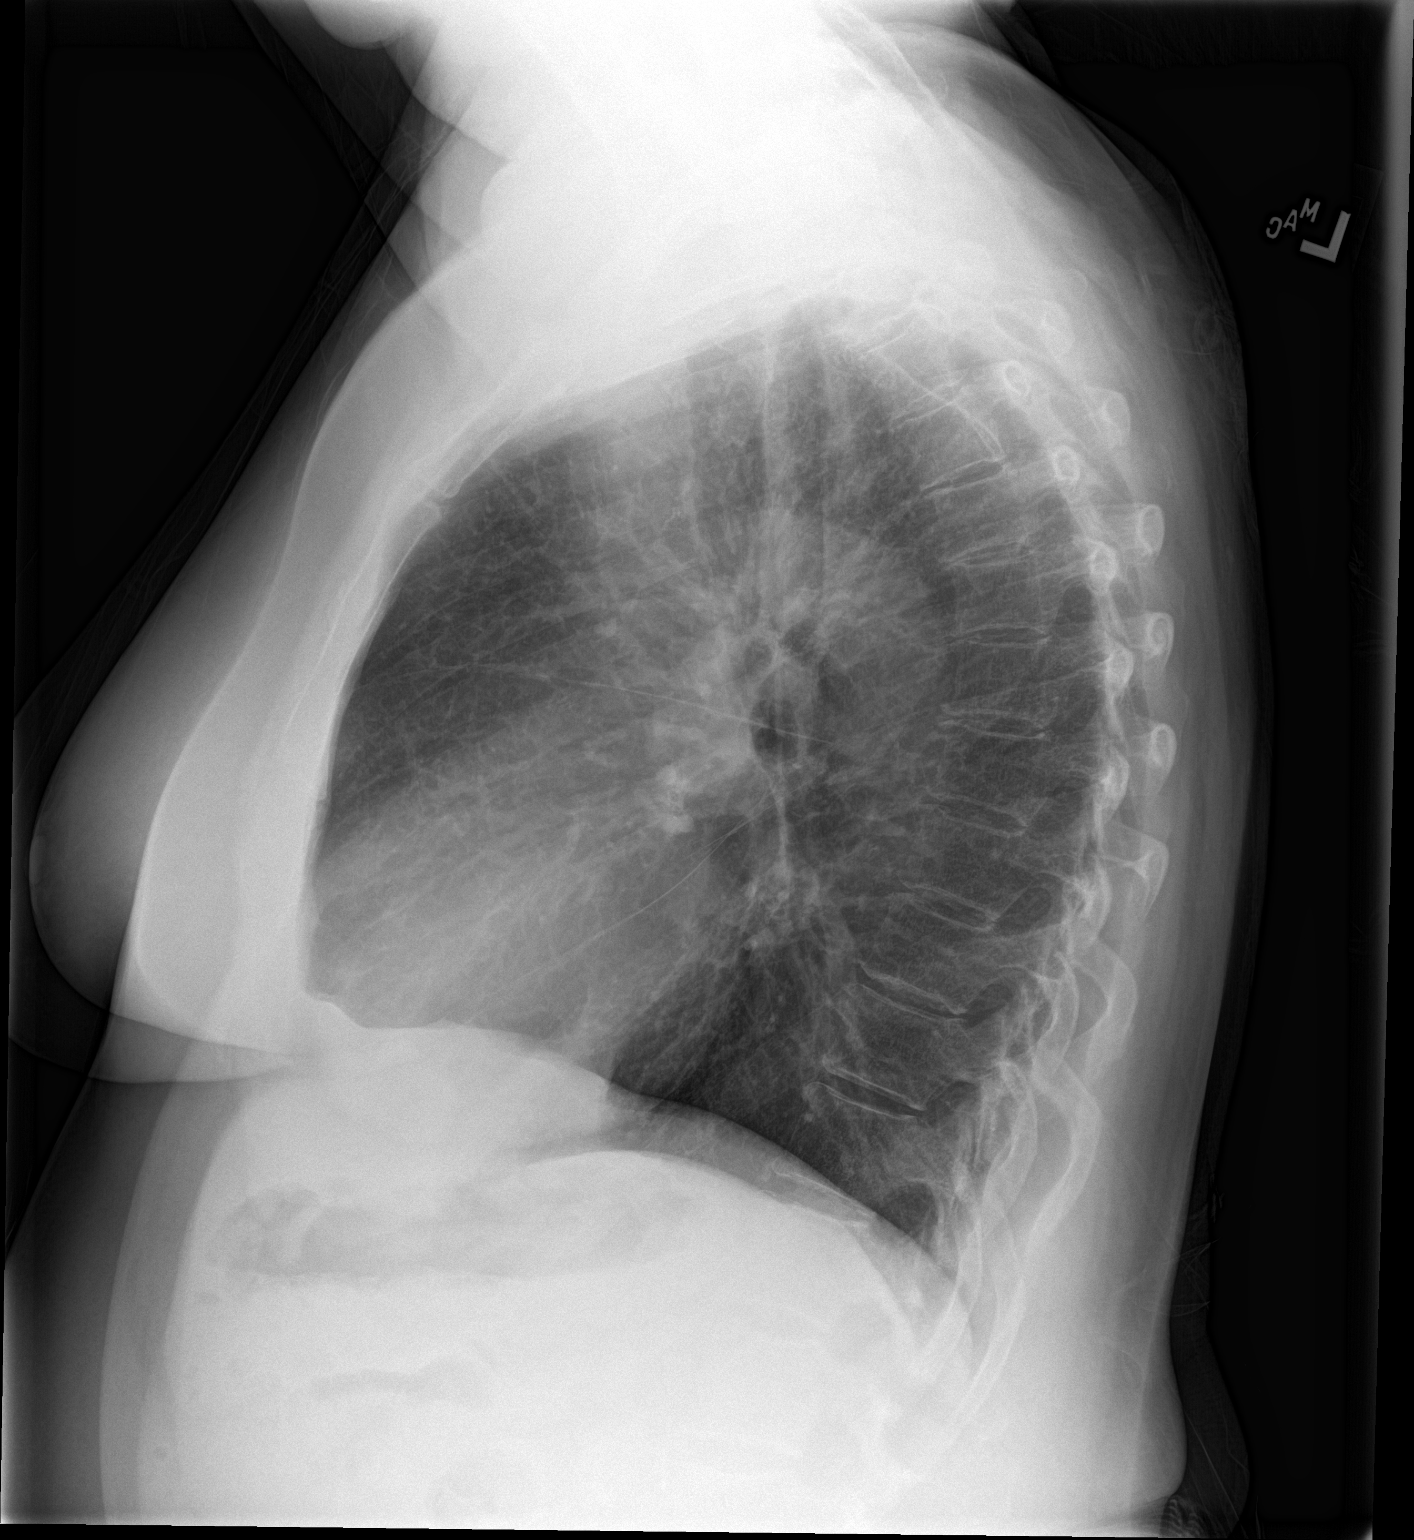

[2 of 2 positions shown; findings below may reference images not displayed]

FINDINGS: Two views of the chest demonstrate emphysematous changes of the
lungs. There is no focal consolidation or pleural effusion or
pneumothorax. The cardiac silhouette is within normal limits. There
is degenerative changes of the spine.
IMPRESSION: No active cardiopulmonary disease.

## 2017-05-03 ENCOUNTER — Telehealth: Payer: Self-pay | Admitting: Internal Medicine

## 2017-05-03 MED ORDER — PREDNISONE 10 MG PO TABS: ORAL_TABLET | ORAL | 0 refills | Status: DC

## 2017-05-03 MED ORDER — DOXYCYCLINE HYCLATE 100 MG PO TABS: 100.0000 mg | ORAL_TABLET | Freq: Two times a day (BID) | ORAL | 0 refills | Status: DC

## 2017-05-03 NOTE — Telephone Encounter (Signed)
[name] called research office with c/o a productive cough that has progressively become worse over the last 3 weeks. She states x 3 days she has been experiencing productive cough with yellow phlegm, increased SOB with daily activities such as getting dressed or climbing a flight of stairs. She also c/o wheezing that is worse at night. Patient denies any fever, sinus congestion, PND, at this time. Patient is in [place] for work and feels that changes in weather there have been a trigger for her symptoms. Patient has been using albuterol inhaler more frequently since the symptoms have worsened. No OTC treatment has been tried at this time.  Please advise.   No Known Allergies   Current Outpatient Prescriptions on File Prior to Visit  Medication Sig Dispense Refill  . albuterol (PROVENTIL HFA;VENTOLIN HFA) 108 (90 Base) MCG/ACT inhaler Inhale 1-2 puffs into the lungs every 6 (six) hours as needed for wheezing or shortness of breath.    Marland Kitchen. albuterol (PROVENTIL) (2.5 MG/3ML) 0.083% nebulizer solution Take 3 mLs (2.5 mg total) by nebulization every 6 (six) hours as needed for wheezing or shortness of breath. 75 mL 12  . amLODipine (NORVASC) 10 MG tablet Take 1 tablet (10 mg total) by mouth daily. 30 tablet 0  . buPROPion (WELLBUTRIN SR) 150 MG 12 hr tablet 1 daily x 3 days and 1 2 x daily thereafter 60 tablet 3  . Fluticasone-Umeclidin-Vilant (TRELEGY ELLIPTA) 100-62.5-25 MCG/INH AEPB Inhale 1 puff into the lungs daily. 30 each 11  . HYDROcodone-homatropine (HYCODAN) 5-1.5 MG/5ML syrup Take 5 mLs by mouth every 6 (six) hours as needed for cough. 240 mL 0  . lansoprazole (PREVACID) 30 MG capsule Take 1 capsule (30 mg total) by mouth daily. 90 capsule 1  . NON FORMULARY RESEARCH DRUG    . predniSONE (DELTASONE) 10 MG tablet 6 x 2 days, 5 x 2 days, 4 x 2 days, 3 x 2 days, 2 x 2 days, 1 x 2 days 42 tablet 0  . sertraline (ZOLOFT) 100 MG tablet Take 1 tablet (100 mg total) by mouth daily. 90 tablet 1   . Spacer/Aero-Holding Chambers (AEROCHAMBER PLUS) inhaler Use as instructed 1 each 2   No current facility-administered medications on file prior to visit.    [facility] [place], [place] (patient verified this is where meds need to be sent)

## 2017-05-03 NOTE — Telephone Encounter (Signed)
Spoke with patient. She is aware of MRs recs. Verbalized understanding. Nothing further needed at time of call.  

## 2017-05-03 NOTE — Telephone Encounter (Signed)
There is ae-copd. She called reearch office but should have gone to triag. Please address     Take prednisone 40 mg daily x 2 days, then 20mg  daily x 2 days, then 10mg  daily x 2 days, then 5mg  daily x 2 days and stop   Take doxycycline 100mg  po twice daily x 5 days; take after meals and avoid sunlight    No Known Allergies   Dr. Kalman ShanMurali Pope Brunty, M.D., [facility].P Pulmonary and Critical Care Medicine Staff Physician [facility] [place] Pulmonary and Critical Care Pager: [phone], If no answer or between  15:00h - 7:00h: call [phone]  05/03/2017 4:50 PM

## 2017-05-14 ENCOUNTER — Other Ambulatory Visit: Payer: Self-pay | Admitting: *Deleted

## 2017-05-14 MED ORDER — ALBUTEROL SULFATE HFA 108 (90 BASE) MCG/ACT IN AERS: 1.0000 | INHALATION_SPRAY | Freq: Four times a day (QID) | RESPIRATORY_TRACT | 2 refills | Status: DC | PRN

## 2017-05-30 ENCOUNTER — Telehealth: Payer: Self-pay | Admitting: Internal Medicine

## 2017-05-30 MED ORDER — LEVOFLOXACIN 500 MG PO TABS: 500.0000 mg | ORAL_TABLET | Freq: Every day | ORAL | 0 refills | Status: DC

## 2017-05-30 MED ORDER — PREDNISONE 10 MG PO TABS: ORAL_TABLET | ORAL | 0 refills | Status: DC

## 2017-05-30 NOTE — Telephone Encounter (Signed)
Pt aware of rec's per Dr Marchelle Gearingamaswamy.  Pt is going to look at getting a pulse ox to keep tabs on her O2 levels.  Rx sent to requested pharmacy.  Nothing further needed.

## 2017-05-30 NOTE — Telephone Encounter (Signed)
Pt c/o wheezing, coughing and SOB x 1.5 weeks Pt notes a lot of mucus with yellow color. [place] is [place] in [place] and she is having issues with the elevation - 6,01700ft above sea level.  Pt using Mucinex OTC for symptoms -- not getting much relief.  [facility] [place] [facility] [place]  Pt requesting Prednisone (6 day taper) and Levaquin 500mg .   Please advise Dr Marchelle Gearingamaswamy. Thanks.

## 2017-05-30 NOTE — Telephone Encounter (Signed)
Ok for  take levaquin 500mg  once daily  X 6 days  And  Take prednisone 40 mg daily x 2 days, then 20mg  daily x 2 days, then 10mg  daily x 2 days, then 5mg  daily x 2 days and stop  Because of elevation doing 8d prednisone  She should go check her pulse ox - either buy a pulse ox an dif hypoxemic or not getting beter or getting worse should consider ER v descent. Altitutde can be dangerous for her  Dr. Kalman ShanMurali Dashun Borre, M.D., [facility].P Pulmonary and Critical Care Medicine Staff Physician [place] [facility] [place] Pulmonary and Critical Care Pager: [phone], If no answer or between  15:00h - 7:00h: call [phone]  05/30/2017 3:13 PM

## 2017-06-05 ENCOUNTER — Telehealth: Payer: Self-pay | Admitting: Internal Medicine

## 2017-06-05 NOTE — Telephone Encounter (Signed)
Dr. Marchelle Gearingamaswamy office patient: Patient called research office to seek help obtaining an appointment for Monday August 13th. The patient suffers from frequent AECOPD. She also travels a great deal for her [place] and [place]. The patient is only able to come to town on Sat, Sun, and Mondays, and then leaves again for assignment on Tuesday mornings.   On 05/30/2017 the subject called to triage c/o wheezing, productive cough with yellow phlegm and increased SOB x 1.5 weeks. Patient was using Mucinex without relief. Dr. Marchelle Gearingamaswamy prescribed Levaquin 500mg  x 6 days and Prednisone Taper x 8 days. Patient also requested to get an appointment on Monday August 13th while she was in town, but there are no open slots.   On 06/04/17 the patient called into the research office to request help in getting an appointment on Monday because she states she completed the course of Levaquin but does not feel she has improved. She states the first few days of taking the prednisone and levaquin she felt some improvement but over the last 24-48 hours she has begun to experience an increase on SOB, wheezing, chest tightness, and productive cough again. Patient is requesting to be seen on Monday if anyway possible. I advised her that I will speak with the Pulmonary clinic and see what can be done to work her into a schedule. Patient advised to seek medical care at a local urgent care or ER if symptoms get worse or do not improve. Patient states understanding.   No Known Allergies  Current Outpatient Prescriptions on File Prior to Visit  Medication Sig Dispense Refill  . albuterol (PROVENTIL HFA;VENTOLIN HFA) 108 (90 Base) MCG/ACT inhaler Inhale 1-2 puffs into the lungs every 6 (six) hours as needed for wheezing or shortness of breath. 1 Inhaler 2  . albuterol (PROVENTIL) (2.5 MG/3ML) 0.083% nebulizer solution Take 3 mLs (2.5 mg total) by nebulization every 6 (six) hours as needed for wheezing or  shortness of breath. 75 mL 12  . amLODipine (NORVASC) 10 MG tablet Take 1 tablet (10 mg total) by mouth daily. 30 tablet 0  . buPROPion (WELLBUTRIN SR) 150 MG 12 hr tablet 1 daily x 3 days and 1 2 x daily thereafter 60 tablet 3  . Fluticasone-Umeclidin-Vilant (TRELEGY ELLIPTA) 100-62.5-25 MCG/INH AEPB Inhale 1 puff into the lungs daily. 30 each 11  . HYDROcodone-homatropine (HYCODAN) 5-1.5 MG/5ML syrup Take 5 mLs by mouth every 6 (six) hours as needed for cough. 240 mL 0  . lansoprazole (PREVACID) 30 MG capsule Take 1 capsule (30 mg total) by mouth daily. 90 capsule 1  . levofloxacin (LEVAQUIN) 500 MG tablet Take 1 tablet (500 mg total) by mouth daily. 6 tablet 0  . predniSONE (DELTASONE) 10 MG tablet Take 4 tabs po x 2 days, then 2 x 2 days, then 1 x 2 days, then 1/2 x 2 days and stop. 15 tablet 0  . sertraline (ZOLOFT) 100 MG tablet Take 1 tablet (100 mg total) by mouth daily. 90 tablet 1  . Spacer/Aero-Holding Chambers (AEROCHAMBER PLUS) inhaler Use as instructed 1 each 2   No current facility-administered medications on file prior to visit.

## 2017-06-05 NOTE — Telephone Encounter (Signed)
Spoke with patient. She has been scheduled with CY on 8/13 at 2pm. She is aware of appt. Nothing else needed at time of call.

## 2017-06-07 ENCOUNTER — Ambulatory Visit (HOSPITAL_BASED_OUTPATIENT_CLINIC_OR_DEPARTMENT_OTHER)
Admission: RE | Admit: 2017-06-07 | Discharge: 2017-06-07 | Disposition: A | Discharge status: Home / Self Care | Payer: BLUE CROSS/BLUE SHIELD | Source: Ambulatory Visit | Attending: Medical | Admitting: Medical

## 2017-06-07 ENCOUNTER — Encounter: Payer: Self-pay | Admitting: Medical

## 2017-06-07 ENCOUNTER — Ambulatory Visit (INDEPENDENT_AMBULATORY_CARE_PROVIDER_SITE_OTHER): Payer: BLUE CROSS/BLUE SHIELD | Admitting: Medical

## 2017-06-07 VITALS — BP 131/58 | HR 86 | Temp 98.5°F | Resp 16 | Ht 62.0 in | Wt 157.0 lb

## 2017-06-07 DIAGNOSIS — M7989 Other specified soft tissue disorders: Secondary | ICD-10-CM | POA: Insufficient documentation

## 2017-06-07 DIAGNOSIS — M79672 Pain in left foot: Secondary | ICD-10-CM

## 2017-06-07 DIAGNOSIS — M79675 Pain in left toe(s): Secondary | ICD-10-CM

## 2017-06-07 LAB — CBC WITH DIFFERENTIAL/PLATELET
BASOS ABS: 0 {cells}/uL (ref 0–200)
Basophils Relative: 0 %
EOS ABS: 224 {cells}/uL (ref 15–500)
Eosinophils Relative: 2 %
HCT: 35 % (ref 35.0–45.0)
Hemoglobin: 11.3 g/dL — ABNORMAL LOW (ref 11.7–15.5)
LYMPHS PCT: 23 %
Lymphs Abs: 2576 cells/uL (ref 850–3900)
MCH: 27.1 pg (ref 27.0–33.0)
MCHC: 32.3 g/dL (ref 32.0–36.0)
MCV: 83.9 fL (ref 80.0–100.0)
MONOS PCT: 8 %
MPV: 9.2 fL (ref 7.5–12.5)
Monocytes Absolute: 896 cells/uL (ref 200–950)
NEUTROS PCT: 67 %
Neutro Abs: 7504 cells/uL (ref 1500–7800)
PLATELETS: 383 10*3/uL (ref 140–400)
RBC: 4.17 MIL/uL (ref 3.80–5.10)
RDW: 15.9 % — AB (ref 11.0–15.0)
WBC: 11.2 10*3/uL — ABNORMAL HIGH (ref 3.8–10.8)

## 2017-06-07 MED ORDER — CEPHALEXIN 500 MG PO CAPS: 500.0000 mg | ORAL_CAPSULE | Freq: Two times a day (BID) | ORAL | 0 refills | Status: DC

## 2017-06-07 MED ORDER — DICLOFENAC SODIUM 75 MG PO TBEC: 75.0000 mg | DELAYED_RELEASE_TABLET | Freq: Two times a day (BID) | ORAL | 0 refills | Status: DC

## 2017-06-07 MED ORDER — TRAMADOL HCL 50 MG PO TABS: 50.0000 mg | ORAL_TABLET | Freq: Three times a day (TID) | ORAL | 0 refills | Status: DC | PRN

## 2017-06-07 MED ORDER — KETOROLAC TROMETHAMINE 60 MG/2ML IM SOLN
60.0000 mg | Freq: Once | INTRAMUSCULAR | Status: AC
  Administered 2017-06-07: 60 mg via INTRAMUSCULAR

## 2017-06-07 NOTE — Patient Instructions (Addendum)
For your left foot/toe area pain we gave you toradol 60 mg im  By location and level of pain consider probable gout. Will get xray of foot, cbc and uric acid.  Tomorrow can start diclofenac rx nsaid.  rx short course tramadol to use if needed.  Print rx keflex antibiotic if signs/symptoms become more like skin infection.  Follow up in 7 days or as needed

## 2017-06-07 NOTE — Progress Notes (Signed)
Subjective:    Patient ID: Morgan Harper, female    DOB: Nov 21, 1955, 61 y.o.   MRN: [id number]  HPI  Pt in with some recent lt foot, great toe  Region pain and some pain at base of the nail. Pain level described as moderate to severe.   She states at base of the toenail was mild faint tender on Tuesday. But since then pain mostly in base of the toe.  Pt has no history of gout.  No trauma or fall.      Review of Systems  Constitutional: Negative for chills, fatigue and fever.  Respiratory: Negative for cough, chest tightness, shortness of breath and wheezing.   Cardiovascular: Negative for chest pain and palpitations.  Gastrointestinal: Negative for abdominal pain.  Musculoskeletal:       Left great toe pain.  Neurological: Negative for dizziness, seizures, speech difficulty, light-headedness, numbness and headaches.  Hematological: Negative for adenopathy. Does not bruise/bleed easily.  Psychiatric/Behavioral: Negative for behavioral problems and confusion.   Past Medical History:  Diagnosis Date  . Anemia 01/13/2016  . Chronic bronchitis (HCC)   . Depression 09/24/2015  . Diarrhea 04/29/2016  . GERD (gastroesophageal reflux disease)   . H/O measles   . H/O mumps   . Hiatal hernia   . History of chicken pox   . Hyperlipidemia, mixed 09/24/2015  . Hypertension   . Insomnia      Social History   Social History  . Marital status: Married    Spouse name: N/A  . Number of children: N/A  . Years of education: N/A   Occupational History  . Not on file.   Social History Main Topics  . Smoking status: Current Every Day Smoker    Packs/day: 1.00    Years: 45.00    Types: Cigarettes    Last attempt to quit: 03/29/2016  . Smokeless tobacco: Never Used     Comment: Pt started back 02/08/17 2-3 cigs daily  . Alcohol use No  . Drug use: No  . Sexual activity: Not on file     Comment: work managing a hotel, lives with husband, niece and grandnephew, no dietary    Other Topics Concern  . Not on file   Social History Narrative   Sales.     Past Surgical History:  Procedure Laterality Date  . TONSILLECTOMY      Family History  Problem Relation Age of Onset  . Hypertension Mother   . Allergies Mother   . Asthma Mother   . COPD Mother        previous smoker  . Diabetes Sister   . Alzheimer's disease Maternal Grandfather   . Heart disease Neg Hx     No Known Allergies  Current Outpatient Prescriptions on File Prior to Visit  Medication Sig Dispense Refill  . albuterol (PROVENTIL HFA;VENTOLIN HFA) 108 (90 Base) MCG/ACT inhaler Inhale 1-2 puffs into the lungs every 6 (six) hours as needed for wheezing or shortness of breath. 1 Inhaler 2  . albuterol (PROVENTIL) (2.5 MG/3ML) 0.083% nebulizer solution Take 3 mLs (2.5 mg total) by nebulization every 6 (six) hours as needed for wheezing or shortness of breath. 75 mL 12  . amLODipine (NORVASC) 10 MG tablet Take 1 tablet (10 mg total) by mouth daily. 30 tablet 0  . buPROPion (WELLBUTRIN SR) 150 MG 12 hr tablet 1 daily x 3 days and 1 2 x daily thereafter 60 tablet 3  . Fluticasone-Umeclidin-Vilant (TRELEGY ELLIPTA) 100-62.5-25 MCG/INH AEPB Inhale  1 puff into the lungs daily. 30 each 11  . HYDROcodone-homatropine (HYCODAN) 5-1.5 MG/5ML syrup Take 5 mLs by mouth every 6 (six) hours as needed for cough. 240 mL 0  . lansoprazole (PREVACID) 30 MG capsule Take 1 capsule (30 mg total) by mouth daily. 90 capsule 1  . sertraline (ZOLOFT) 100 MG tablet Take 1 tablet (100 mg total) by mouth daily. 90 tablet 1  . Spacer/Aero-Holding Chambers (AEROCHAMBER PLUS) inhaler Use as instructed 1 each 2   No current facility-administered medications on file prior to visit.     BP (!) 131/58   Pulse 86   Temp 98.5 F (36.9 C) (Oral)   Resp 16   Ht 5\' 2"  (1.575 m)   Wt 157 lb (71.2 kg)   SpO2 97%   BMI 28.72 kg/m       Objective:   Physical Exam  General- No acute distress. Pleasant patient. Lungs-  Clear, even and unlabored. Heart- regular rate and rhythm. Neurologic- CNII- XII grossly intact.  Left foot- base of great toe pain moderate pain on palpation. Faint great toe pain. No severe redness or warmth. Around edge of great toenail no infected appearance.      Assessment & Plan:  for your left foot/toe area pain will give you toradol 60 mg im.  By location and level of pain consider probable gout. Will get xray of foot, cbc and uric acid.  Tomorrow can start diclofenac rx nsaid.  rx short course tramadol to use if needed.  Print rx keflex antibiotic if signs/symptoms become more like skin infection.  Follow up in 7 days or as needed  Shivali Quackenbush, Ramon DredgeEdward, [facility] CorporationPA-C

## 2017-06-08 LAB — URIC ACID: Uric Acid, Serum: 5.9 mg/dL (ref 2.5–7.0)

## 2017-06-10 ENCOUNTER — Encounter: Payer: Self-pay | Admitting: Medical

## 2017-06-10 ENCOUNTER — Ambulatory Visit: Payer: BLUE CROSS/BLUE SHIELD | Admitting: Internal Medicine

## 2017-06-20 ENCOUNTER — Other Ambulatory Visit: Payer: Self-pay | Admitting: Family Medicine

## 2017-06-20 MED ORDER — SERTRALINE HCL 100 MG PO TABS: 100.0000 mg | ORAL_TABLET | Freq: Every day | ORAL | 1 refills | Status: DC

## 2017-06-20 NOTE — Telephone Encounter (Signed)
Requesting:Zoloft Contract:no UDS:no Last OV:06/07/17 Next OV:not scheduled Last Refill:12/14/16 #[id number]   Please advise

## 2017-06-20 NOTE — Telephone Encounter (Signed)
Self   Refill request for ZOLOFT   Pharmacy: [facility], [place]    Pt says that she is completely out.

## 2017-06-20 NOTE — Telephone Encounter (Signed)
This is not controlled and so I prescribed a 6 month supply but she will need an annual exam before that runs out.

## 2017-06-21 NOTE — Telephone Encounter (Signed)
I tried to call pt to advise that Rx was faxed to [facility] and to [facility] [place] to have it transferred but VM is full/she will need an annual exam before runs out of refills/thx dmf

## 2017-07-17 ENCOUNTER — Encounter: Payer: Self-pay | Admitting: Internal Medicine

## 2017-07-17 ENCOUNTER — Telehealth: Payer: Self-pay | Admitting: Internal Medicine

## 2017-07-17 NOTE — Telephone Encounter (Signed)
Left detailed message for pt that appt has been made to see MR on 07/18/17 at 12p and to call back to confirm or reschedule appt. Will need to call back early on 07/18/17.

## 2017-07-17 NOTE — Telephone Encounter (Signed)
Apparently patient Morgan Harper trying to schedule with me but has been tough due to her travels and my schedule. She called desparetely to research office and they informed me. Can you call her please and see when she can be worked in  Thanks  Dr. Kalman Shan, M.D., [facility].C.P Pulmonary and Critical Care Medicine Staff Physician [place] [facility] [place] Pulmonary and Critical Care Pager: [phone], If no answer or between  15:00h - 7:00h: call [phone]  07/17/2017 4:30 PM

## 2017-07-18 ENCOUNTER — Ambulatory Visit: Payer: BLUE CROSS/BLUE SHIELD | Admitting: Internal Medicine

## 2017-07-18 NOTE — Telephone Encounter (Signed)
Per pt's chart she called the office and cancelled this appt earlier today with the reason that she was 'out of town.'  Called patient to see if she would like to reschedule  Spoke with patient who reported she is in [place] currently.  She knows that she will be home for the last weekend in September and would like to see MR on Oct 1 or 2.  Checked with Robynn Pane and MR - unable to double-book.  Offered appt with APP >> pt refused, stating she has only seen MR one time and wants to see only him.    Pt then stated she can try Nov 1 or 2 >> appt scheduled for 11.2.18 @ 0915.  Pt is aware she may call the office to check for an available appt on the Oct 1 or 2.  Pt voiced her understanding.  Nothing further needed at this time; will sign and forward to MR to make him aware.

## 2017-08-06 ENCOUNTER — Other Ambulatory Visit: Payer: Self-pay | Admitting: Family Medicine

## 2017-08-09 ENCOUNTER — Other Ambulatory Visit: Payer: Self-pay

## 2017-08-09 MED ORDER — LISINOPRIL 2.5 MG PO TABS: 2.5000 mg | ORAL_TABLET | Freq: Every day | ORAL | 0 refills | Status: DC

## 2017-08-09 NOTE — Telephone Encounter (Signed)
This med was Soleil Mas/C'ed on 4.30.2018 for change in therapy/thx dmf

## 2017-08-13 ENCOUNTER — Telehealth: Payer: Self-pay | Admitting: Internal Medicine

## 2017-08-13 NOTE — Telephone Encounter (Signed)
Title: Randomised, Double-Blind (Sponsor Open), Placebo-Controlled, Multicentre, Dose Ranging Study to Evaluate the Efficacy and Safety of Danirixin Tablets Administered Twice Daily Compared With Placebo for 24 Weeks in Adults With COPD  Sponsor: [employer] , Protoocol Number: [id number], NCT Trial #: [id number]  ///////////////// I Morgan Harper with 1956-03-18 to  Give status update - [employer] Safety and Advisory Letter dated 08/07/2017. I  discussed interim results of above study with subject. Explained no benefit with IP but also increased pneumonia risk, musculoskeletal AE and headadaches seen with IP in the above study. It is currently not known if subject received - placebo v  IP dosage. Subject thanked for study participation and updated.  I was her treating pulmonologist and have informed myself. In addition,  note sent to PCP Bradd Canary, MD  as required by the safety letter. No action required. Subject did not have pneumonia during the study. She is no longer a study participant. [employer] has cancelled drug development.   Dr. Kalman Shan, M.D., [facility].C.P Pulmonary and Critical Care Medicine Staff Physician [place] [facility] [place] Pulmonary and Critical Care Pager: [phone], If no answer or between  15:00h - 7:00h: call [phone]  08/13/2017 11:26 AM

## 2017-08-30 ENCOUNTER — Ambulatory Visit: Payer: BLUE CROSS/BLUE SHIELD | Admitting: Internal Medicine

## 2017-09-09 ENCOUNTER — Other Ambulatory Visit: Payer: Self-pay | Admitting: Family Medicine

## 2017-10-09 ENCOUNTER — Telehealth: Payer: Self-pay | Admitting: Internal Medicine

## 2017-10-09 MED ORDER — PREDNISONE 10 MG PO TABS: ORAL_TABLET | ORAL | 0 refills | Status: DC

## 2017-10-09 MED ORDER — DOXYCYCLINE HYCLATE 100 MG PO TABS: 100.0000 mg | ORAL_TABLET | Freq: Two times a day (BID) | ORAL | 0 refills | Status: DC

## 2017-10-09 NOTE — Telephone Encounter (Signed)
Pt c/o increased chest congestion, prod cough with yellow mucus X3 days.  Denies fever, chest pain, sinus congestion.  Pt has been taking codeine cough syrup qhs to help with recs- requesting pred taper and an abx.    [facility] [address].    MR please advise on recs.  Thanks.

## 2017-10-09 NOTE — Telephone Encounter (Signed)
AECOPD   - Take doxycycline 100mg  po twice daily x 5 days; take after meals and avoid sunlight   - Take prednisone 40 mg daily x 2 days, then 20mg  daily x 2 days, then 10mg  daily x 2 days, then 5mg  daily x 2 days and stop    Dr. Kalman ShanMurali Karleigh Bunte, M.D., [facility].P Pulmonary and Critical Care Medicine Staff Physician, [facility] Center Director - Interstitial Lung Disease  Program  Pulmonary Fibrosis [facility] [place], [place], [id number]  Pager: [phone], If no answer or between  15:00h - 7:00h: call [phone] Telephone: [phone]

## 2017-10-09 NOTE — Telephone Encounter (Signed)
Spoke with pt, aware of recs.  rx's sent to preferred pharmacy.  Nothing further needed.  

## 2017-11-21 ENCOUNTER — Other Ambulatory Visit: Payer: Self-pay | Admitting: Family Medicine

## 2017-12-01 ENCOUNTER — Other Ambulatory Visit: Payer: Self-pay | Admitting: Family Medicine

## 2017-12-06 ENCOUNTER — Ambulatory Visit: Payer: BLUE CROSS/BLUE SHIELD | Admitting: Adult Health

## 2017-12-06 ENCOUNTER — Encounter: Payer: Self-pay | Admitting: Adult Health

## 2017-12-06 DIAGNOSIS — J441 Chronic obstructive pulmonary disease with (acute) exacerbation: Secondary | ICD-10-CM | POA: Diagnosis not present

## 2017-12-06 MED ORDER — HYDROCODONE-HOMATROPINE 5-1.5 MG/5ML PO SYRP: 5.0000 mL | ORAL_SOLUTION | Freq: Four times a day (QID) | ORAL | 0 refills | Status: DC | PRN

## 2017-12-06 MED ORDER — PREDNISONE 10 MG PO TABS: ORAL_TABLET | ORAL | 0 refills | Status: DC

## 2017-12-06 MED ORDER — LEVALBUTEROL HCL 0.63 MG/3ML IN NEBU
0.6300 mg | INHALATION_SOLUTION | Freq: Once | RESPIRATORY_TRACT | Status: AC
  Administered 2017-12-06: 0.63 mg via RESPIRATORY_TRACT

## 2017-12-06 MED ORDER — LEVOFLOXACIN 500 MG PO TABS: 500.0000 mg | ORAL_TABLET | Freq: Every day | ORAL | 0 refills | Status: AC

## 2017-12-06 NOTE — Addendum Note (Signed)
Addended by: Jacquiline DoeROGDON, Hammond Obeirne M on: 12/06/2017 03:09 PM   Modules accepted: Orders

## 2017-12-06 NOTE — Progress Notes (Signed)
 @Patient  ID: Morgan Harper, female    DOB: [birth year], 62 y.o.   MRN: [id number]  Chief Complaint  Patient presents with  . Acute Visit    COPD     Referring provider: Bradd CanaryBlyth, [name] A, MD  HPI: 62 year old female former smoker followed for gold 2 COPD  12/06/2017 Acute OV : COPD  Patient presents for an acute office visit.  Patient complains of a one-week history of productive cough with thick mucus, congestion, shortness of breath and wheezing.  She denies any hemoptysis, fever,  chest pain or orthopnea.Marland Kitchen. Appetite is good w/ no n/v.d.  Remains on TRELEGY daily.  Cough is keeping her up at night . Wants rx for cough syrup  Says Levaquin is only abx that works for her .   [place]aving for [place] -work trip in few days.    No Known Allergies  Immunization History  Administered Date(s) Administered  . Influenza Split 10/02/2011, 07/29/2017  . Influenza Whole 07/28/2008, 07/29/2014  . Influenza, Seasonal, Injecte, Preservative Fre 07/13/2015  . Influenza,inj,Quad PF,6+ Mos 11/13/2016  . Td 01/17/2010  . Zoster 09/15/2015    Past Medical History:  Diagnosis Date  . Anemia 01/13/2016  . Chronic bronchitis (HCC)   . Depression 09/24/2015  . Diarrhea 04/29/2016  . GERD (gastroesophageal reflux disease)   . H/O measles   . H/O mumps   . Hiatal hernia   . History of chicken pox   . Hyperlipidemia, mixed 09/24/2015  . Hypertension   . Insomnia     Tobacco History: Social History   Tobacco Use  Smoking Status Former Smoker  . Packs/day: 1.00  . Years: 45.00  . Pack years: 45.00  . Types: Cigarettes  . Last attempt to quit: 03/29/2016  . Years since quitting: 1.6  Smokeless Tobacco Never Used  Tobacco Comment   Pt started back 02/08/17 2-3 cigs daily   Counseling given: Not Answered Comment: Pt started back 02/08/17 2-3 cigs daily   Outpatient Encounter Medications as of 12/06/2017  Medication Sig  . albuterol (PROVENTIL) (2.5 MG/3ML) 0.083% nebulizer solution Take 3  mLs (2.5 mg total) by nebulization every 6 (six) hours as needed for wheezing or shortness of breath.  Marland Kitchen. amLODipine (NORVASC) 10 MG tablet Take 1 tablet (10 mg total) by mouth daily.  Marland Kitchen. buPROPion (WELLBUTRIN SR) 150 MG 12 hr tablet 1 daily x 3 days and 1 2 x daily thereafter  . lansoprazole (PREVACID) 30 MG capsule TAKE ONE CAPSULE BY MOUTH DAILY AT NOON  . sertraline (ZOLOFT) 100 MG tablet Take 1 tablet (100 mg total) by mouth daily.  Marland Kitchen. Spacer/Aero-Holding Chambers (AEROCHAMBER PLUS) inhaler Use as instructed  . traMADol (ULTRAM) 50 MG tablet Take 1 tablet (50 mg total) by mouth every 8 (eight) hours as needed.  . [DISCONTINUED] albuterol (PROVENTIL HFA;VENTOLIN HFA) 108 (90 Base) MCG/ACT inhaler Inhale 1-2 puffs into the lungs every 6 (six) hours as needed for wheezing or shortness of breath.  . [DISCONTINUED] cephALEXin (KEFLEX) 500 MG capsule Take 1 capsule (500 mg total) by mouth 2 (two) times daily.  . [DISCONTINUED] diclofenac (VOLTAREN) 75 MG EC tablet Take 1 tablet (75 mg total) by mouth 2 (two) times daily.  . [DISCONTINUED] doxycycline (VIBRA-TABS) 100 MG tablet Take 1 tablet (100 mg total) by mouth 2 (two) times daily.  . [DISCONTINUED] Fluticasone-Umeclidin-Vilant (TRELEGY ELLIPTA) 100-62.5-25 MCG/INH AEPB Inhale 1 puff into the lungs daily.  . [DISCONTINUED] lisinopril (PRINIVIL,ZESTRIL) 2.5 MG tablet TAKE 1 TABLET(2.5 MG) BY MOUTH DAILY  . [  DISCONTINUED] predniSONE (DELTASONE) 10 MG tablet 4 tabs daily X2 days, 2 tabs daily X2 days, 1 tab daily X2 days, 0.5 tab daily X2 days.  Marland Kitchen HYDROcodone-homatropine (HYDROMET) 5-1.5 MG/5ML syrup Take 5 mLs by mouth every 6 (six) hours as needed.  Marland Kitchen levofloxacin (LEVAQUIN) 500 MG tablet Take 1 tablet (500 mg total) by mouth daily for 7 days.  . predniSONE (DELTASONE) 10 MG tablet 4 tabs for 2 days, then 3 tabs for 2 days, 2 tabs for 2 days, then 1 tab for 2 days, then stop  . [DISCONTINUED] HYDROcodone-homatropine (HYDROMET) 5-1.5 MG/5ML syrup Take  5 mLs by mouth every 6 (six) hours as needed.   No facility-administered encounter medications on file as of 12/06/2017.      Review of Systems  Constitutional:   No  weight loss, night sweats,  Fevers, chills, fatigue, or  lassitude.  HEENT:   No headaches,  Difficulty swallowing,  Tooth/dental problems, or  Sore throat,                No sneezing, itching, ear ache,  +nasal congestion, post nasal drip,   CV:  No chest pain,  Orthopnea, PND, swelling in lower extremities, anasarca, dizziness, palpitations, syncope.   GI  No heartburn, indigestion, abdominal pain, nausea, vomiting, diarrhea, change in bowel habits, loss of appetite, bloody stools.   Resp:   No chest wall deformity  Skin: no rash or lesions.  GU: no dysuria, change in color of urine, no urgency or frequency.  No flank pain, no hematuria   MS:  No joint pain or swelling.  No decreased range of motion.  No back pain.    Physical Exam  BP (!) 144/72 (BP Location: Left Arm, Cuff Size: Normal)   Pulse 85   Temp 98.2 F (36.8 C) (Oral)   Ht 5' 1.5" (1.562 m)   Wt 153 lb 6.4 oz (69.6 kg)   SpO2 95%   BMI 28.52 kg/m   GEN: A/Ox3; pleasant , NAD    HEENT:  [place]/AT,  EACs-clear, TMs-wnl, NOSE-clear, THROAT-clear, no lesions, no postnasal drip or exudate noted.   NECK:  Supple w/ fair ROM; no JVD; normal carotid impulses w/o bruits; no thyromegaly or nodules palpated; no lymphadenopathy.    RESP  Few rhonchi ,  no accessory muscle use, no dullness to percussion  CARD:  RRR, no m/r/g, no peripheral edema, pulses intact, no cyanosis or clubbing.  GI:   Soft & nt; nml bowel sounds; no organomegaly or masses detected.   Musco: Warm bil, no deformities or joint swelling noted.   Neuro: alert, no focal deficits noted.    Skin: Warm, no lesions or rashes    Lab Results:  CBC   BNP No results found for: BNP  ProBNP No results found for: PROBNP  Imaging: No results found.   Assessment & Plan:    COPD exacerbation (HCC) Exacerbation with bronchitis  xopenex neb x 1   Plan  . Patient Instructions  Levaquin 500mg  daily for 7 days , take with food.  Mucinex DM Twice daily  .As needed  Cough/congestion  Prednisone taper over next week.  Hydromet 1 tsp every 6hr as needed for cough .  Fluids and rest  Follow up with Dr. Marchelle Gearing in 3 months and As needed   Please contact office for sooner follow up if symptoms do not improve or worsen or seek emergency care          Rubye Oaks, NP 12/06/2017

## 2017-12-06 NOTE — Patient Instructions (Addendum)
Levaquin 500mg  daily for 7 days , take with food.  Mucinex DM Twice daily  .As needed  Cough/congestion  Prednisone taper over next week.  Hydromet 1 tsp every 6hr as needed for cough .  Fluids and rest  Follow up with Dr. Marchelle Gearingamaswamy in 3 months and As needed   Please contact office for sooner follow up if symptoms do not improve or worsen or seek emergency care

## 2017-12-06 NOTE — Assessment & Plan Note (Signed)
Exacerbation with bronchitis  xopenex neb x 1   Plan  . Patient Instructions  Levaquin 500mg  daily for 7 days , take with food.  Mucinex DM Twice daily  .As needed  Cough/congestion  Prednisone taper over next week.  Hydromet 1 tsp every 6hr as needed for cough .  Fluids and rest  Follow up with Dr. Marchelle Gearingamaswamy in 3 months and As needed   Please contact office for sooner follow up if symptoms do not improve or worsen or seek emergency care

## 2017-12-24 ENCOUNTER — Other Ambulatory Visit: Payer: Self-pay | Admitting: Family Medicine

## 2017-12-25 ENCOUNTER — Other Ambulatory Visit: Payer: Self-pay

## 2017-12-25 MED ORDER — SERTRALINE HCL 100 MG PO TABS: 100.0000 mg | ORAL_TABLET | Freq: Every day | ORAL | 1 refills | Status: DC

## 2017-12-26 ENCOUNTER — Other Ambulatory Visit: Payer: Self-pay | Admitting: Internal Medicine

## 2018-01-09 ENCOUNTER — Encounter: Payer: Self-pay | Admitting: Internal Medicine

## 2018-01-19 IMAGING — DX DG CHEST 2V
2 series · 2 of 2 positions shown · non-contrast
Comparison: 04/11/2016.

CLINICAL DATA: Cough and congestion for 3 weeks, smoker.

EXAM:
CHEST  2 VIEW

[chest pa]
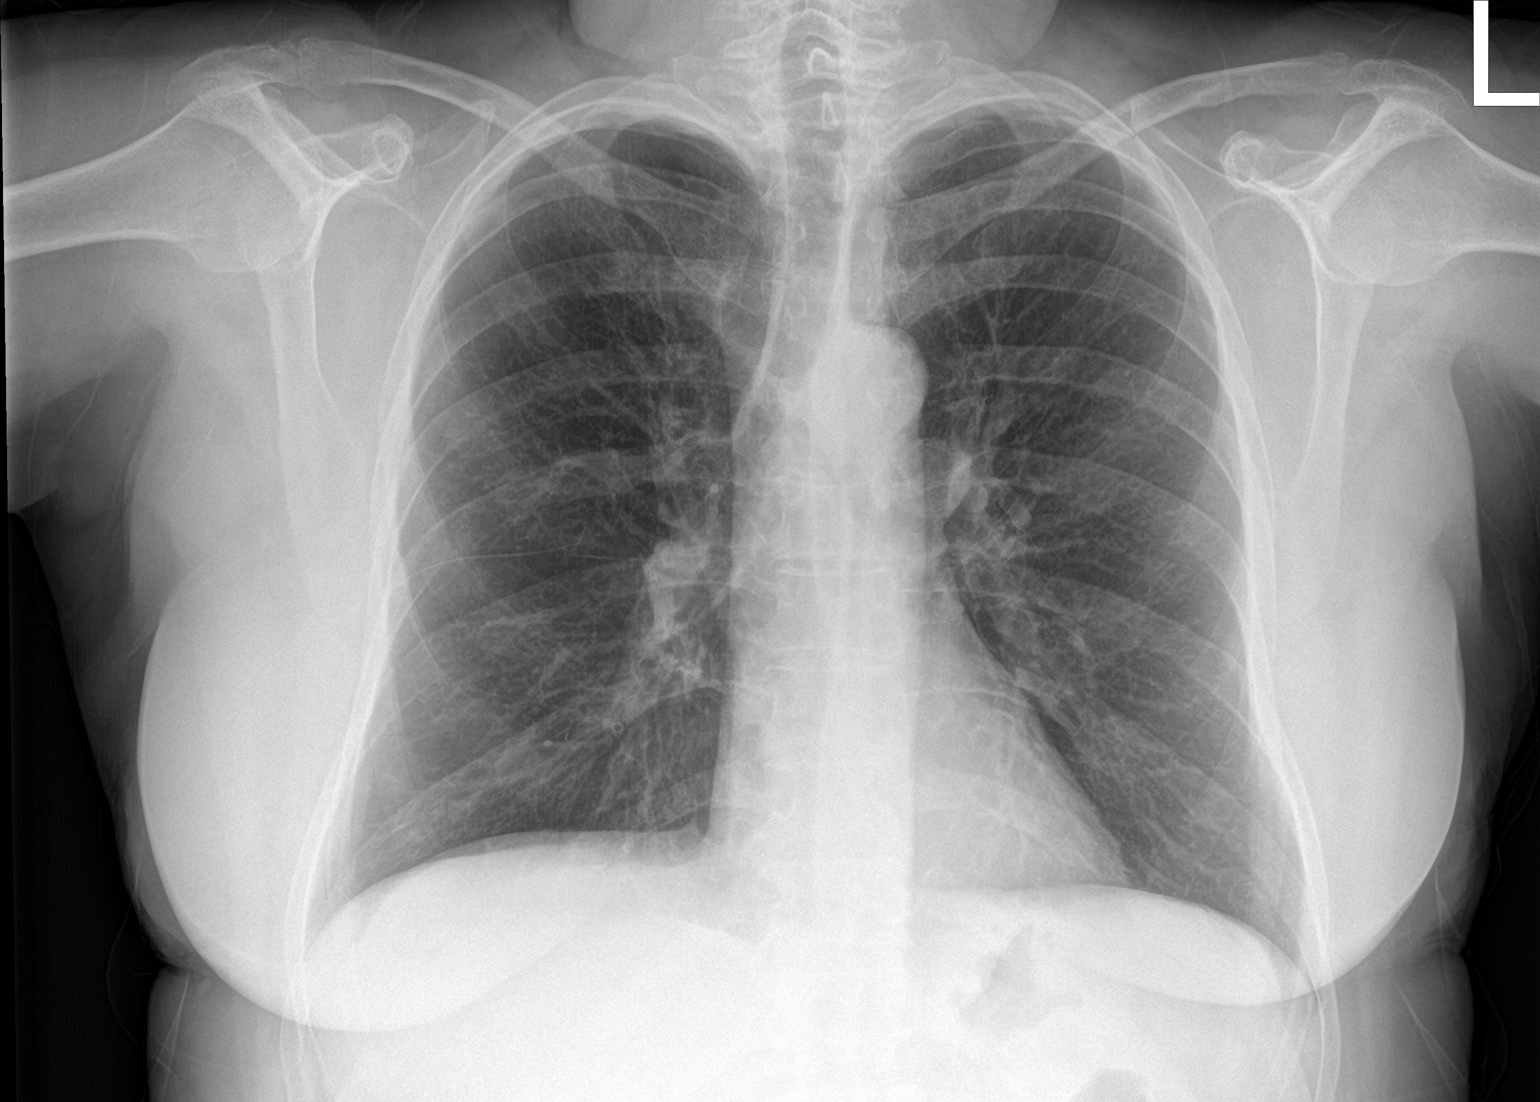

[chest lat]
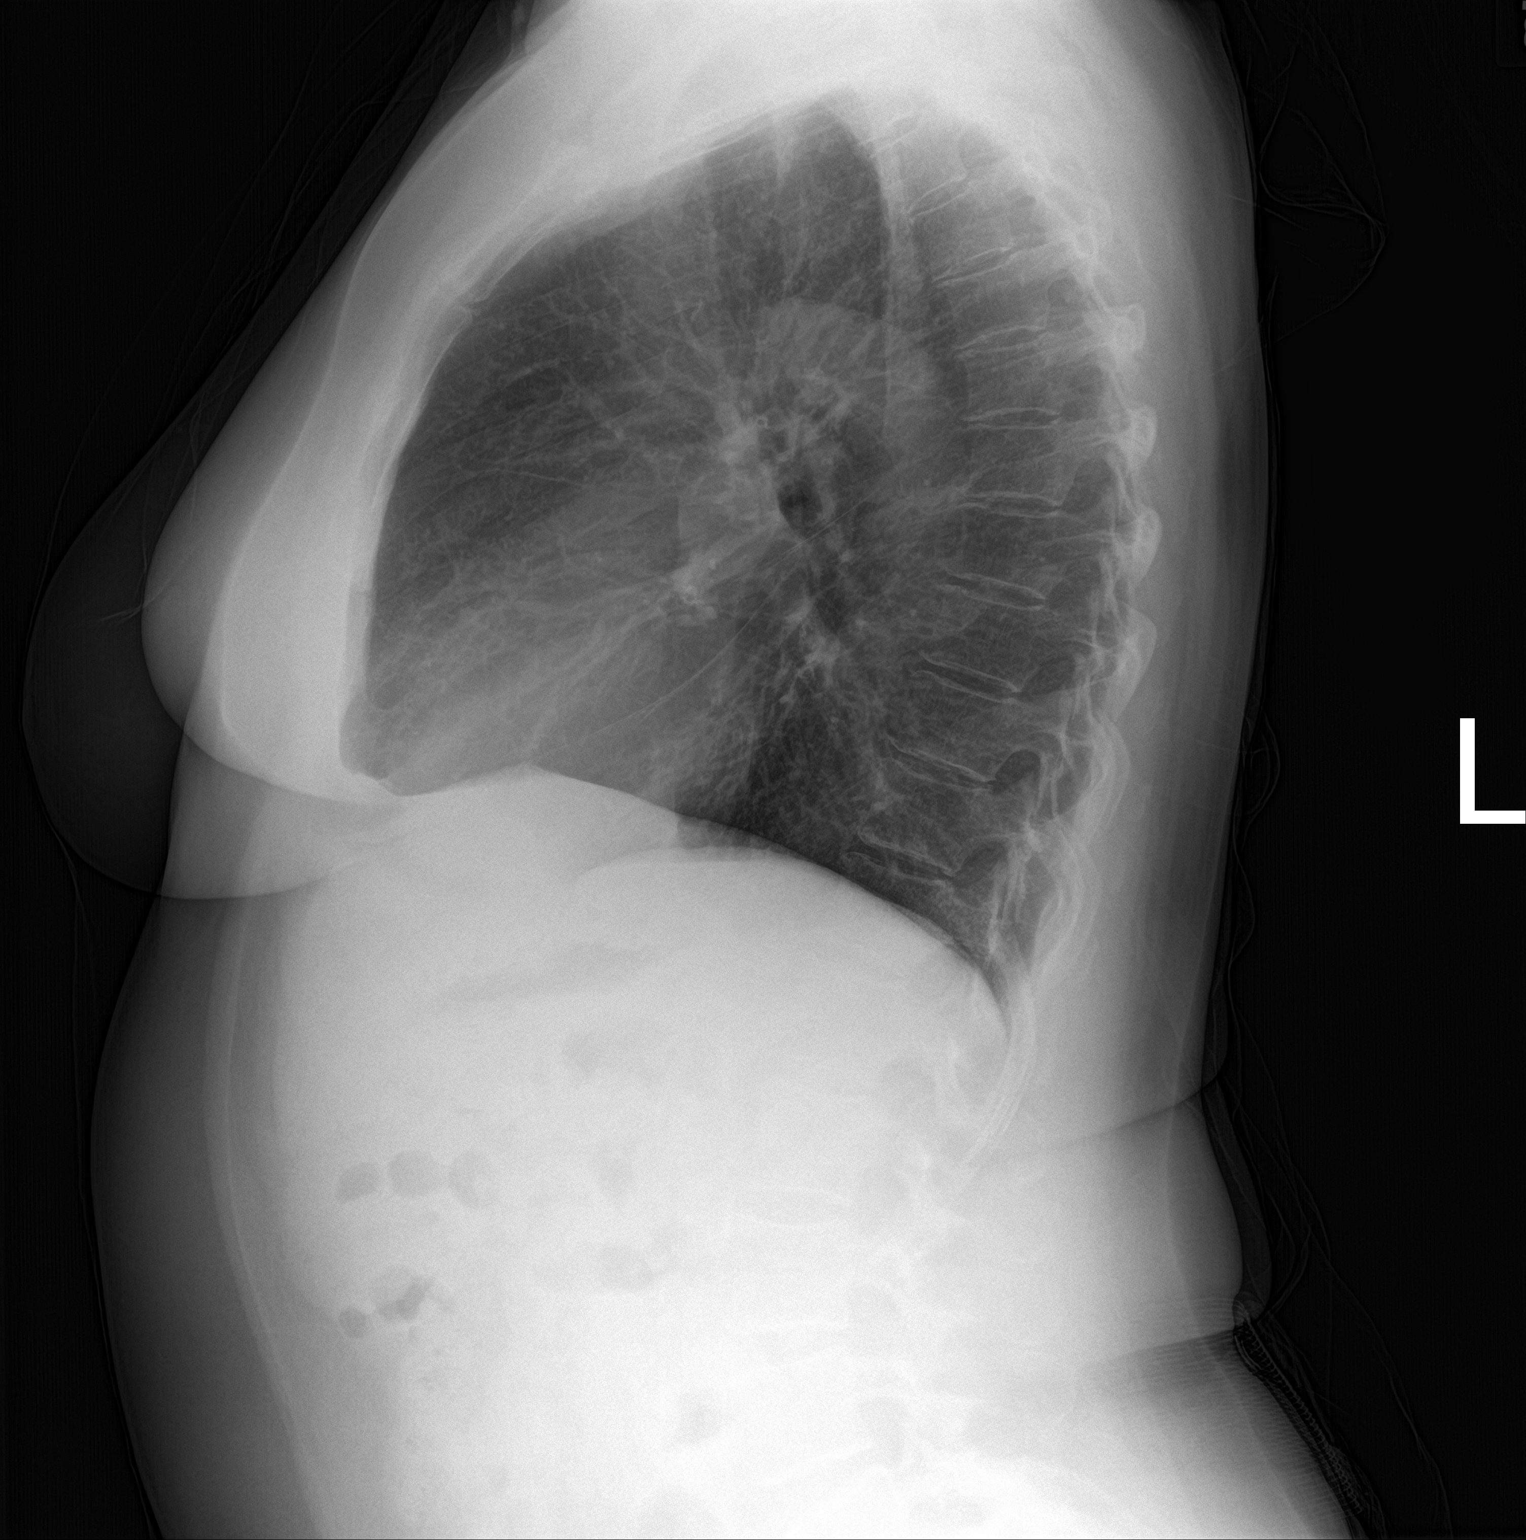

[2 of 2 positions shown; findings below may reference images not displayed]

FINDINGS: The heart size and mediastinal contours are within normal limits.
Both lungs are clear. The visualized skeletal structures are
unremarkable. No change from priors.
IMPRESSION: No active cardiopulmonary disease.

## 2018-01-20 ENCOUNTER — Telehealth: Payer: Self-pay | Admitting: Internal Medicine

## 2018-01-20 MED ORDER — PREDNISONE 10 MG PO TABS: ORAL_TABLET | ORAL | 0 refills | Status: DC

## 2018-01-20 MED ORDER — DOXYCYCLINE HYCLATE 100 MG PO TABS: 100.0000 mg | ORAL_TABLET | Freq: Two times a day (BID) | ORAL | 0 refills | Status: DC

## 2018-01-20 NOTE — Telephone Encounter (Signed)
Pt returning call and can be reached @ [phone].Caren GriffinsStanley A Dalton

## 2018-01-20 NOTE — Telephone Encounter (Signed)
  AECOPD  Plan Take doxycycline 100mg  po twice daily x 5 days; take after meals and avoid sunlight  Take prednisone 40 mg daily x 2 days, then 20mg  daily x 2 days, then 10mg  daily x 2 days, then 5mg  daily x 2 days and stop    No Known Allergies

## 2018-01-20 NOTE — Telephone Encounter (Signed)
Attempted to contact pt. No answer, no option to leave a message. Will try back.  

## 2018-01-20 NOTE — Telephone Encounter (Signed)
Spoke with patient. She is aware of MR's recs. Per patient, she wants to have these medications called into her local pharmacy instead of the pharmacy [place] by in [place]. Medications have been sent. Nothing else needed at time of call.

## 2018-01-20 NOTE — Telephone Encounter (Signed)
Spoke with pt. States that she is not feeling well. Reports coughing and wheezing. Cough is producing clear mucus. Denies chest tightness, SOB or fever. Symptoms started 3-4 days ago. Pt would like to have something sent in.  MR - please advise. Thanks.

## 2018-01-20 NOTE — Telephone Encounter (Signed)
Patient returning call, CB [phone]

## 2018-03-26 ENCOUNTER — Other Ambulatory Visit: Payer: Self-pay | Admitting: Internal Medicine

## 2018-04-11 ENCOUNTER — Other Ambulatory Visit: Payer: Self-pay | Admitting: Family Medicine

## 2018-06-06 ENCOUNTER — Other Ambulatory Visit: Payer: Self-pay | Admitting: Family Medicine

## 2018-06-07 ENCOUNTER — Emergency Department
Admission: EM | Admit: 2018-06-07 | Discharge: 2018-06-07 | Disposition: A | Discharge status: Home / Self Care | Payer: Self-pay | Source: Home / Self Care | Attending: Family Medicine | Admitting: Family Medicine

## 2018-06-07 ENCOUNTER — Encounter: Payer: Self-pay | Admitting: Emergency Medicine

## 2018-06-07 DIAGNOSIS — J441 Chronic obstructive pulmonary disease with (acute) exacerbation: Secondary | ICD-10-CM

## 2018-06-07 MED ORDER — METHYLPREDNISOLONE ACETATE 80 MG/ML IJ SUSP
80.0000 mg | Freq: Once | INTRAMUSCULAR | Status: AC
  Administered 2018-06-07: 80 mg via INTRAMUSCULAR

## 2018-06-07 MED ORDER — PREDNISONE 20 MG PO TABS: ORAL_TABLET | ORAL | 0 refills | Status: DC

## 2018-06-07 MED ORDER — IPRATROPIUM-ALBUTEROL 0.5-2.5 (3) MG/3ML IN SOLN
3.0000 mL | Freq: Once | RESPIRATORY_TRACT | Status: AC
  Administered 2018-06-07: 3 mL via RESPIRATORY_TRACT

## 2018-06-07 MED ORDER — ALBUTEROL SULFATE (2.5 MG/3ML) 0.083% IN NEBU: 2.5000 mg | INHALATION_SOLUTION | RESPIRATORY_TRACT | 0 refills | Status: DC | PRN

## 2018-06-07 NOTE — Discharge Instructions (Signed)
°  Please follow up with family medicine next week if needed.  Please call 911 or go to the hospital if symptoms significantly worsening.

## 2018-06-07 NOTE — ED Provider Notes (Signed)
[facility]    CSN: [id number] Arrival date & time: 06/07/18  [id number]     History   Chief Complaint Chief Complaint  Patient presents with  . Cough  . Wheezing    HPI Morgan Harper is a 62 y.o. female.   HPI  Morgan Harper is a 62 y.o. female presenting to UC with hx of COPD c/o COPD exacerbation over the last 6 days with wheeze, chest tightness, cough, congestion and mild SOB with minimal temporary relief with her albuterol inhaler.  She does continue to smoke but is trying to quit. She does not know what triggers her exacerbations.  Denies fever, chills, n/v/d. No other symptoms. She does well with nebulizer treatments and steroids. She needs a refill for her nebulizer medication.    Past Medical History:  Diagnosis Date  . Anemia 01/13/2016  . Chronic bronchitis (HCC)   . Depression 09/24/2015  . Diarrhea 04/29/2016  . GERD (gastroesophageal reflux disease)   . H/O measles   . H/O mumps   . Hiatal hernia   . History of chicken pox   . Hyperlipidemia, mixed 09/24/2015  . Hypertension   . Insomnia     Patient Active Problem List   Diagnosis Date Noted  . Stage 2 moderate COPD by GOLD classification (HCC) 03/22/2017  . Research subject 11/23/2016  . Diarrhea 04/29/2016  . Acute respiratory failure with hypoxia (HCC) 04/11/2016  . Anemia 01/13/2016  . Hyperlipidemia, mixed 09/24/2015  . Depression 09/24/2015  . History of chicken pox   . COPD exacerbation (HCC) 08/09/2015  . Acute bronchitis 10/24/2014  . Routine general medical examination at a health care facility 09/30/2014  . Tobacco abuse 06/04/2013  . Essential hypertension 11/06/2008  . GERD 03/06/2008    Past Surgical History:  Procedure Laterality Date  . TONSILLECTOMY      OB History   None      Home Medications    Prior to Admission medications   Medication Sig Start Date End Date Taking? Authorizing Provider  albuterol (PROVENTIL) (2.5 MG/3ML) 0.083% nebulizer solution Take 3  mLs (2.5 mg total) by nebulization every 4 (four) hours as needed for wheezing or shortness of breath. 06/07/18   Lurene ShadowPhelps, Chloris Marcoux O, PA-C  amLODipine (NORVASC) 10 MG tablet Take 1 tablet (10 mg total) by mouth daily. 04/14/16   Drema DallasWoods, Curtis J, MD  buPROPion [facility](WELLBUTRIN SR) 150 MG 12 hr tablet 1 daily x 3 days and 1 2 x daily thereafter 02/13/17   Kalman Shanamaswamy, Murali, MD  doxycycline (VIBRA-TABS) 100 MG tablet Take 1 tablet (100 mg total) by mouth 2 (two) times daily. 01/20/18   Kalman Shanamaswamy, Murali, MD  HYDROcodone-homatropine (HYDROMET) 5-1.5 MG/5ML syrup Take 5 mLs by mouth every 6 (six) hours as needed. 12/06/17   Parrett, Virgel Bouquetammy S, NP  lansoprazole (PREVACID) 30 MG capsule Take 1 capsule (30 mg total) by mouth daily at 12 noon. Needs to follow up w/ pcp before any more refills 06/06/18   Bradd CanaryBlyth, [name] A, MD  lisinopril (PRINIVIL,ZESTRIL) 2.5 MG tablet Take 1 tablet (2.5 mg total) by mouth daily. Needs ov 04/11/18   Bradd CanaryBlyth, [name] A, MD  predniSONE (DELTASONE) 20 MG tablet 3 tabs po daily x 3 days, then 2 tabs x 3 days, then 1.5 tabs x 3 days, then 1 tab x 3 days, then 0.5 tabs x 3 days 06/07/18   Lurene ShadowPhelps, Cassidi Modesitt O, PA-C  sertraline (ZOLOFT) 100 MG tablet Take 1 tablet (100 mg total) by mouth daily. 12/25/17  Bradd Canary, MD  Spacer/Aero-Holding Chambers (AEROCHAMBER PLUS) inhaler Use as instructed 07/10/16   Bradd Canary, MD  traMADol (ULTRAM) 50 MG tablet Take 1 tablet (50 mg total) by mouth every 8 (eight) hours as needed. 06/07/17   Saguier, Ramon Dredge, PA-C  TRELEGY ELLIPTA 100-62.5-25 MCG/INH AEPB INHALE 1 PUFF BY MOUTH EVERY DAY 12/27/17   Kalman Shan, MD  VENTOLIN HFA 108 (90 Base) MCG/ACT inhaler INHALE ONE TO TWO PUFFS BY MOUTH EVERY 6 HOURS AS NEEDED FOR WHEEZING OR SHORTNESS OF BREATH 03/26/18   Kalman Shan, MD    Family History Family History  Problem Relation Age of Onset  . Hypertension Mother   . Allergies Mother   . Asthma Mother   . COPD Mother        previous smoker  . Diabetes  Sister   . Alzheimer's disease Maternal Grandfather   . Heart disease Neg Hx     Social History Social History   Tobacco Use  . Smoking status: Former Smoker    Packs/day: 1.00    Years: 45.00    Pack years: 45.00    Types: Cigarettes    Last attempt to quit: 03/29/2016    Years since quitting: 2.1  . Smokeless tobacco: Never Used  . Tobacco comment: Pt started back 02/08/17 2-3 cigs daily  Substance Use Topics  . Alcohol use: No    Alcohol/week: 0.0 standard drinks  . Drug use: No     Allergies   Patient has no known allergies.   Review of Systems Review of Systems  Constitutional: Negative for chills and fever.  HENT: Positive for congestion. Negative for ear pain, sore throat, trouble swallowing and voice change.   Respiratory: Positive for cough, chest tightness, shortness of breath and wheezing.   Cardiovascular: Negative for chest pain and palpitations.  Gastrointestinal: Negative for abdominal pain, diarrhea, nausea and vomiting.  Musculoskeletal: Negative for arthralgias, back pain and myalgias.  Skin: Negative for rash.     Physical Exam Triage Vital Signs ED Triage Vitals  Enc Vitals Group     BP      Pulse      Resp      Temp      Temp src      SpO2      Weight      Height      Head Circumference      Peak Flow      Pain Score      Pain Loc      Pain Edu?      Excl. in GC?    No data found.  Updated Vital Signs BP 138/82 (BP Location: Right Arm)   Pulse 99   Temp 98.6 F (37 C) (Oral)   Resp 16   Ht 5\' 1"  (1.549 m)   Wt 148 lb 8 oz (67.4 kg)   SpO2 94%   BMI 28.06 kg/m   Visual Acuity Right Eye Distance:   Left Eye Distance:   Bilateral Distance:    Right Eye Near:   Left Eye Near:    Bilateral Near:     Physical Exam  Constitutional: She is oriented to person, place, and time. She appears well-developed and well-nourished. No distress.  HENT:  Head: Normocephalic and atraumatic.  Right Ear: Tympanic membrane normal.    Left Ear: Tympanic membrane normal.  Nose: Nose normal.  Mouth/Throat: Uvula is midline, oropharynx is clear and moist and mucous membranes are normal.  Eyes: EOM  are normal.  Neck: Normal range of motion.  Cardiovascular: Normal rate and regular rhythm.  Pulmonary/Chest: Effort normal. No stridor. No respiratory distress. She has wheezes.  Diffuse inspiratory and expiratory wheeze  Musculoskeletal: Normal range of motion.  Neurological: She is alert and oriented to person, place, and time.  Skin: Skin is warm and dry. She is not diaphoretic.  Psychiatric: She has a normal mood and affect. Her behavior is normal.  Nursing note and vitals reviewed.    UC Treatments / Results  Labs (all labs ordered are listed, but only abnormal results are displayed) Labs Reviewed - No data to display  EKG None  Radiology No results found.  Procedures Procedures (including critical care time)  Medications Ordered in UC Medications  ipratropium-albuterol (DUONEB) 0.5-2.5 (3) MG/3ML nebulizer solution 3 mL (3 mLs Nebulization Given 06/07/18 0958)  methylPREDNISolone acetate (DEPO-MEDROL) injection 80 mg (80 mg Intramuscular Given 06/07/18 0958)    Initial Impression / Assessment and Plan / UC Course  I have reviewed the triage vital signs and the nursing notes.  Pertinent labs & imaging results that were available during my care of the patient were reviewed by me and considered in my medical decision making (see chart for details).     Hx and exam c/w COPD exacerbation w/o bacterial infection  Duoneb and depomedrol given- moderate improvement Pt states she feels improvement and lung sounds- no inspiratory wheeze anymore, just expiratory  Final Clinical Impressions(s) / UC Diagnoses   Final diagnoses:  COPD exacerbation [facility])     Discharge Instructions      Please follow up with family medicine next week if needed.  Please call 911 or go to the hospital if symptoms  significantly worsening.    ED Prescriptions    Medication Sig Dispense Auth. Provider   predniSONE (DELTASONE) 20 MG tablet 3 tabs po daily x 3 days, then 2 tabs x 3 days, then 1.5 tabs x 3 days, then 1 tab x 3 days, then 0.5 tabs x 3 days 27 tablet Marian Grandt O, PA-C   albuterol (PROVENTIL) (2.5 MG/3ML) 0.083% nebulizer solution Take 3 mLs (2.5 mg total) by nebulization every 4 (four) hours as needed for wheezing or shortness of breath. 30 vial Lurene Shadow, PA-C     Controlled Substance Prescriptions [place] Controlled Substance Registry consulted? Not Applicable   Rolla Plate 06/07/18 1012

## 2018-06-07 NOTE — ED Triage Notes (Signed)
[facility] with C/O cough and wheezing times 6 days, occasional yellow sputum. History COPD, continues to smoke patient states she is trying to quit.

## 2018-08-11 ENCOUNTER — Other Ambulatory Visit: Payer: Self-pay | Admitting: Family Medicine

## 2018-08-23 ENCOUNTER — Other Ambulatory Visit: Payer: Self-pay

## 2018-08-23 ENCOUNTER — Emergency Department
Admission: EM | Admit: 2018-08-23 | Discharge: 2018-08-23 | Disposition: A | Discharge status: Home / Self Care | Payer: Self-pay | Source: Home / Self Care | Attending: Family Medicine | Admitting: Family Medicine

## 2018-08-23 DIAGNOSIS — J441 Chronic obstructive pulmonary disease with (acute) exacerbation: Secondary | ICD-10-CM

## 2018-08-23 MED ORDER — DOXYCYCLINE HYCLATE 100 MG PO CAPS: 100.0000 mg | ORAL_CAPSULE | Freq: Two times a day (BID) | ORAL | 0 refills | Status: DC

## 2018-08-23 MED ORDER — PREDNISONE 20 MG PO TABS: ORAL_TABLET | ORAL | 0 refills | Status: DC

## 2018-08-23 MED ORDER — METHYLPREDNISOLONE SODIUM SUCC 125 MG IJ SOLR
80.0000 mg | Freq: Once | INTRAMUSCULAR | Status: AC
  Administered 2018-08-23: 80 mg via INTRAMUSCULAR

## 2018-08-23 MED ORDER — IPRATROPIUM-ALBUTEROL 0.5-2.5 (3) MG/3ML IN SOLN
3.0000 mL | Freq: Once | RESPIRATORY_TRACT | Status: AC
  Administered 2018-08-23: 3 mL via RESPIRATORY_TRACT

## 2018-08-23 NOTE — ED Triage Notes (Signed)
Pt c/o cough x 1 week. Shortness of breath and wheezing associated with. Has had bronchitis in past.

## 2018-08-23 NOTE — Discharge Instructions (Addendum)
Begin prednisone Sunday 08/24/18. Take plain guaifenesin (1200mg  extended release tabs such as Mucinex) twice daily, with plenty of water, for cough and congestion.   Get adequate rest.   Continue albuterol inhaler and nebulizer. Try warm salt water gargles for sore throat.  Continue Tessalon as needed for cough at bedtime. Stop all antihistamines for now, and other non-prescription cough/cold preparations.

## 2018-08-23 NOTE — ED Provider Notes (Signed)
[facility]    CSN: [id number] Arrival date & time: 08/23/18  4098     History   Chief Complaint Chief Complaint  Patient presents with  . Cough  . Shortness of Breath    HPI Morgan Harper is a 62 y.o. female.   About one week ago patient developed typical cold-like symptoms developing over several days, including mild sore throat, sinus congestion, headache, fatigue, and cough.  Her cough has become worse with increased wheezing and shortness of breath.  She has a history of COPD and chronic bronchitis.  She continues to smoke.  The history is provided by the patient.    Past Medical History:  Diagnosis Date  . Anemia 01/13/2016  . Chronic bronchitis (HCC)   . Depression 09/24/2015  . Diarrhea 04/29/2016  . GERD (gastroesophageal reflux disease)   . H/O measles   . H/O mumps   . Hiatal hernia   . History of chicken pox   . Hyperlipidemia, mixed 09/24/2015  . Hypertension   . Insomnia     Patient Active Problem List   Diagnosis Date Noted  . Stage 2 moderate COPD by GOLD classification (HCC) 03/22/2017  . Research subject 11/23/2016  . Diarrhea 04/29/2016  . Acute respiratory failure with hypoxia (HCC) 04/11/2016  . Anemia 01/13/2016  . Hyperlipidemia, mixed 09/24/2015  . Depression 09/24/2015  . History of chicken pox   . COPD exacerbation (HCC) 08/09/2015  . Acute bronchitis 10/24/2014  . Routine general medical examination at a health care facility 09/30/2014  . Tobacco abuse 06/04/2013  . Essential hypertension 11/06/2008  . GERD 03/06/2008    Past Surgical History:  Procedure Laterality Date  . TONSILLECTOMY      OB History   None      Home Medications    Prior to Admission medications   Medication Sig Start Date End Date Taking? Authorizing Provider  albuterol (PROVENTIL) (2.5 MG/3ML) 0.083% nebulizer solution Take 3 mLs (2.5 mg total) by nebulization every 4 (four) hours as needed for wheezing or shortness of breath. 06/07/18    Lurene Shadow, PA-C  amLODipine (NORVASC) 10 MG tablet Take 1 tablet (10 mg total) by mouth daily. 04/14/16   Drema Dallas, MD  [facility] SR) 150 MG 12 hr tablet 1 daily x 3 days and 1 2 x daily thereafter 02/13/17   Kalman Shan, MD  doxycycline (VIBRAMYCIN) 100 MG capsule Take 1 capsule (100 mg total) by mouth 2 (two) times daily. Take with food. 08/23/18   Lattie Haw, MD  HYDROcodone-homatropine (HYDROMET) 5-1.5 MG/5ML syrup Take 5 mLs by mouth every 6 (six) hours as needed. 12/06/17   Parrett, Virgel Bouquet, NP  lansoprazole (PREVACID) 30 MG capsule Take 1 capsule (30 mg total) by mouth daily at 12 noon. Needs to follow up w/ pcp before any more refills 06/06/18   Bradd Canary, MD  lisinopril (PRINIVIL,ZESTRIL) 2.5 MG tablet Take 1 tablet (2.5 mg total) by mouth daily. Needs ov 04/11/18   Bradd Canary, MD  predniSONE (DELTASONE) 20 MG tablet Take one tab by mouth TID for 4 days, then one BID for 4 days, then one daily for 4 days. Take with food. 08/23/18   Lattie Haw, MD  sertraline (ZOLOFT) 100 MG tablet Take 1 tablet (100 mg total) by mouth daily. 12/25/17   Bradd Canary, MD  Spacer/Aero-Holding Chambers (AEROCHAMBER PLUS) inhaler Use as instructed 07/10/16   Bradd Canary, MD  traMADol Janean Sark) 50 MG  tablet Take 1 tablet (50 mg total) by mouth every 8 (eight) hours as needed. 06/07/17   Saguier, Ramon Dredge, PA-C  TRELEGY ELLIPTA 100-62.5-25 MCG/INH AEPB INHALE 1 PUFF BY MOUTH EVERY DAY 12/27/17   Kalman Shan, MD  VENTOLIN HFA 108 (90 Base) MCG/ACT inhaler INHALE ONE TO TWO PUFFS BY MOUTH EVERY 6 HOURS AS NEEDED FOR WHEEZING OR SHORTNESS OF BREATH 03/26/18   Kalman Shan, MD    Family History Family History  Problem Relation Age of Onset  . Hypertension Mother   . Allergies Mother   . Asthma Mother   . COPD Mother        previous smoker  . Diabetes Sister   . Alzheimer's disease Maternal Grandfather   . Heart disease Neg Hx     Social History Social  History   Tobacco Use  . Smoking status: Former Smoker    Packs/day: 1.00    Years: 45.00    Pack years: 45.00    Types: Cigarettes    Last attempt to quit: 03/29/2016    Years since quitting: 2.4  . Smokeless tobacco: Never Used  . Tobacco comment: Pt started back 02/08/17 2-3 cigs daily  Substance Use Topics  . Alcohol use: No    Alcohol/week: 0.0 standard drinks  . Drug use: No     Allergies   Patient has no known allergies.   Review of Systems Review of Systems + sore throat + cough No pleuritic pain + wheezing + nasal congestion + post-nasal drainage No sinus pain/pressure No itchy/red eyes No earache No hemoptysis + SOB No fever, + chills No nausea No vomiting No abdominal pain No diarrhea No urinary symptoms No skin rash + fatigue No myalgias + headache Used OTC meds without relief   Physical Exam Triage Vital Signs ED Triage Vitals [08/23/18 1128]  Enc Vitals Group     BP (!) 177/79     Pulse Rate 92     Resp      Temp 98.4 F (36.9 C)     Temp Source Oral     SpO2 95 %     Weight 146 lb (66.2 kg)     Height 5\' 1"  (1.549 m)     Head Circumference      Peak Flow      Pain Score 0     Pain Loc      Pain Edu?      Excl. in GC?    No data found.  Updated Vital Signs BP (!) 177/79 (BP Location: Right Arm)   Pulse 92   Temp 98.4 F (36.9 C) (Oral)   Ht 5\' 1"  (1.549 m)   Wt 66.2 kg   SpO2 95%   BMI 27.59 kg/m   Visual Acuity Right Eye Distance:   Left Eye Distance:   Bilateral Distance:    Right Eye Near:   Left Eye Near:    Bilateral Near:     Physical Exam Nursing notes and Vital Signs reviewed. Appearance:  Patient appears stated age, and in no acute distress Eyes:  Pupils are equal, round, and reactive to light and accomodation.  Extraocular movement is intact.  Conjunctivae are not inflamed  Ears:  Canals normal.  Tympanic membranes normal.  Nose:  Mildly congested turbinates.  No sinus tenderness.   Pharynx:   Normal Neck:  Supple.  Enlarged posterior/lateral nodes are palpated bilaterally, tender to palpation on the left.   Lungs:  Diffuse bilateral expiratory wheezes.  Breath sounds  are equal.  Moving air well. Heart:  Regular rate and rhythm without murmurs, rubs, or gallops.  Abdomen:  Nontender without masses or hepatosplenomegaly.  Bowel sounds are present.  No CVA or flank tenderness.  Extremities:  No edema.  Skin:  No rash present.    UC Treatments / Results  Labs (all labs ordered are listed, but only abnormal results are displayed) Labs Reviewed - No data to display  EKG None  Radiology No results found.  Procedures Procedures (including critical care time)  Medications Ordered in UC Medications  methylPREDNISolone sodium succinate (SOLU-MEDROL) 125 mg/2 mL injection 80 mg (has no administration in time range)  ipratropium-albuterol (DUONEB) 0.5-2.5 (3) MG/3ML nebulizer solution 3 mL (3 mLs Nebulization Given 08/23/18 1206)    Initial Impression / Assessment and Plan / UC Course  I have reviewed the triage vital signs and the nursing notes.  Pertinent labs & imaging results that were available during my care of the patient were reviewed by me and considered in my medical decision making (see chart for details).    Administered DuoNeb by hand held nebulizer Administered Solumedrol 80mg  IM; begin prednisone burst/taper. Begin empiric doxycycline Followup with Family Doctor if not improved in 7 to 10 days.   Final Clinical Impressions(s) / UC Diagnoses   Final diagnoses:  COPD exacerbation [facility])     Discharge Instructions     Begin prednisone Sunday 08/24/18. Take plain guaifenesin (1200mg  extended release tabs such as Mucinex) twice daily, with plenty of water, for cough and congestion.   Get adequate rest.   Continue albuterol inhaler and nebulizer. Try warm salt water gargles for sore throat.  Continue Tessalon as needed for cough at bedtime. Stop all  antihistamines for now, and other non-prescription cough/cold preparations.      ED Prescriptions    Medication Sig Dispense Auth. Provider   doxycycline (VIBRAMYCIN) 100 MG capsule Take 1 capsule (100 mg total) by mouth 2 (two) times daily. Take with food. 20 capsule Lattie Haw, MD   predniSONE (DELTASONE) 20 MG tablet Take one tab by mouth TID for 4 days, then one BID for 4 days, then one daily for 4 days. Take with food. 24 tablet Lattie Haw, MD        Lattie Haw, MD 08/26/18 2003

## 2018-09-02 ENCOUNTER — Encounter: Payer: Self-pay | Admitting: Family Medicine

## 2018-09-11 NOTE — Progress Notes (Unsigned)
appt rescdh with Dr Carmelia RollerWendling

## 2018-09-11 NOTE — Telephone Encounter (Signed)
Can you please call & schedule patient a OV with an available provider. Thanks

## 2018-09-24 ENCOUNTER — Ambulatory Visit: Payer: Self-pay | Admitting: Family Medicine

## 2018-09-24 DIAGNOSIS — Z0289 Encounter for other administrative examinations: Secondary | ICD-10-CM

## 2018-09-30 ENCOUNTER — Encounter: Payer: Self-pay | Admitting: Family Medicine

## 2018-12-16 ENCOUNTER — Ambulatory Visit: Payer: 59 | Admitting: Medical

## 2018-12-16 ENCOUNTER — Encounter: Payer: Self-pay | Admitting: Medical

## 2018-12-16 VITALS — BP 165/90 | HR 102 | Temp 98.2°F | Resp 16 | Ht 61.0 in | Wt 151.2 lb

## 2018-12-16 DIAGNOSIS — F419 Anxiety disorder, unspecified: Secondary | ICD-10-CM | POA: Diagnosis not present

## 2018-12-16 DIAGNOSIS — I10 Essential (primary) hypertension: Secondary | ICD-10-CM

## 2018-12-16 DIAGNOSIS — J440 Chronic obstructive pulmonary disease with acute lower respiratory infection: Secondary | ICD-10-CM

## 2018-12-16 DIAGNOSIS — R062 Wheezing: Secondary | ICD-10-CM | POA: Diagnosis not present

## 2018-12-16 DIAGNOSIS — J40 Bronchitis, not specified as acute or chronic: Secondary | ICD-10-CM

## 2018-12-16 DIAGNOSIS — R05 Cough: Secondary | ICD-10-CM

## 2018-12-16 DIAGNOSIS — K219 Gastro-esophageal reflux disease without esophagitis: Secondary | ICD-10-CM

## 2018-12-16 DIAGNOSIS — R059 Cough, unspecified: Secondary | ICD-10-CM

## 2018-12-16 DIAGNOSIS — J209 Acute bronchitis, unspecified: Secondary | ICD-10-CM

## 2018-12-16 LAB — COMPREHENSIVE METABOLIC PANEL
ALK PHOS: 64 U/L (ref 39–117)
ALT: 11 U/L (ref 0–35)
AST: 7 U/L (ref 0–37)
Albumin: 4.3 g/dL (ref 3.5–5.2)
BILIRUBIN TOTAL: 0.2 mg/dL (ref 0.2–1.2)
BUN: 17 mg/dL (ref 6–23)
CO2: 29 meq/L (ref 19–32)
CREATININE: 0.82 mg/dL (ref 0.40–1.20)
Calcium: 9.5 mg/dL (ref 8.4–10.5)
Chloride: 106 mEq/L (ref 96–112)
GFR: 70.17 mL/min (ref >60.00)
GLUCOSE: 62 mg/dL — AB (ref 70–99)
Potassium: 4.7 mEq/L (ref 3.5–5.1)
SODIUM: 143 meq/L (ref 135–145)
TOTAL PROTEIN: 6.6 g/dL (ref 6.0–8.3)

## 2018-12-16 MED ORDER — BENZONATATE 100 MG PO CAPS: 100.0000 mg | ORAL_CAPSULE | Freq: Three times a day (TID) | ORAL | 0 refills | Status: AC | PRN

## 2018-12-16 MED ORDER — ALBUTEROL SULFATE HFA 108 (90 BASE) MCG/ACT IN AERS: INHALATION_SPRAY | RESPIRATORY_TRACT | 3 refills | Status: AC

## 2018-12-16 MED ORDER — LISINOPRIL 10 MG PO TABS: 10.0000 mg | ORAL_TABLET | Freq: Every day | ORAL | 3 refills | Status: AC

## 2018-12-16 MED ORDER — SERTRALINE HCL 100 MG PO TABS: 100.0000 mg | ORAL_TABLET | Freq: Every day | ORAL | 1 refills | Status: DC

## 2018-12-16 MED ORDER — ALBUTEROL SULFATE (2.5 MG/3ML) 0.083% IN NEBU: 2.5000 mg | INHALATION_SOLUTION | Freq: Four times a day (QID) | RESPIRATORY_TRACT | 1 refills | Status: AC | PRN

## 2018-12-16 MED ORDER — DOXYCYCLINE HYCLATE 100 MG PO TABS: 100.0000 mg | ORAL_TABLET | Freq: Two times a day (BID) | ORAL | 0 refills | Status: AC

## 2018-12-16 MED ORDER — FLUTICASONE-UMECLIDIN-VILANT 100-62.5-25 MCG/INH IN AEPB: 1.0000 | INHALATION_SPRAY | Freq: Once | RESPIRATORY_TRACT | 3 refills | Status: AC

## 2018-12-16 MED ORDER — LANSOPRAZOLE 30 MG PO CPDR: 30.0000 mg | DELAYED_RELEASE_CAPSULE | Freq: Every day | ORAL | 0 refills | Status: AC

## 2018-12-16 MED ORDER — PREDNISONE 10 MG PO TABS: ORAL_TABLET | ORAL | 0 refills | Status: AC

## 2018-12-16 MED ORDER — HYDROCHLOROTHIAZIDE 12.5 MG PO CAPS: 12.5000 mg | ORAL_CAPSULE | Freq: Every day | ORAL | 3 refills | Status: AC

## 2018-12-16 NOTE — Patient Instructions (Addendum)
For your recent bronchitis with COPD flare, I did prescribe you doxycycline antibiotic.  Also refilled your inhalers and albuterol solution.  In addition went ahead and prescribed 6-day taper dose of prednisone.  For cough making benzonatate available.  I do recommend that you get chest x-ray before you leave for [place] you this coming Friday.  Orders already been placed.  No appointment needed.  For history of anxiety, I did prescribe your sertraline.  For history of GERD, I did refill your Prevacid.  Your blood pressure is high today.  No cardiac or neurologic signs and symptoms.  You have been off of medications for months.  I want you to take lisinopril 10 mg tablets daily.  Check your blood pressure daily over the next 7 days.  If blood pressure is not less than 140/90 then recommend adding low-dose diuretic.  Please get metabolic panel today.   Follow-up in 2 weeks by my chart.  Please send blood pressure readings and notify us whether or not you had start the diuretic.  If your wheezing/bronchitis type symptoms worsen or change while in [place] recommend being evaluated there as well.

## 2018-12-16 NOTE — Progress Notes (Signed)
Subjective:    Patient ID: Morgan Harper, female    DOB: May 13, 1956, 63 y.o.   MRN: [id number]  HPI  Pt in for bronchitis, htn, smoking cessation, refill of copd med and refill of sertraline.   Pt states just yesterday she began with chest congestion and productive cough. No fever, chills or sweats. She get bronchitis and copd flares. She is a smoker. Pt states already wheezing. Heading back to [place] on Friday.   Pt bp is high but has been on meds about 3 months. Pt states stopped amlodipine due to pedal edema. Stopped lisinopril in past when ran out of insurance. No cardiac or neurologic signs or symptoms.  Pt states in past with sertraline seemed to help her mood/anxiety.  Hx of gerd. On prevacid.    Review of Systems  Constitutional: Negative for chills, fatigue and fever.  HENT: Positive for congestion. Negative for sinus pressure and sinus pain.   Eyes: Negative for photophobia.  Respiratory: Positive for wheezing. Negative for apnea, cough and shortness of breath.   Cardiovascular: Negative for chest pain, palpitations and leg swelling.  Gastrointestinal: Negative for abdominal pain, diarrhea, nausea and vomiting.  Musculoskeletal: Positive for neck pain. Negative for back pain and gait problem.  Skin: Negative for rash.  Neurological: Negative for dizziness, tremors, seizures, weakness and headaches.  Hematological: Negative for adenopathy. Does not bruise/bleed easily.  Psychiatric/Behavioral: Negative for behavioral problems, confusion, dysphoric mood, sleep disturbance and suicidal ideas. The patient is nervous/anxious.     Past Medical History:  Diagnosis Date  . Anemia 01/13/2016  . Chronic bronchitis (HCC)   . Depression 09/24/2015  . Diarrhea 04/29/2016  . GERD (gastroesophageal reflux disease)   . H/O measles   . H/O mumps   . Hiatal hernia   . History of chicken pox   . Hyperlipidemia, mixed 09/24/2015  . Hypertension   . Insomnia      Social History    Socioeconomic History  . Marital status: Married    Spouse name: Not on file  . Number of children: Not on file  . Years of education: Not on file  . Highest education level: Not on file  Occupational History  . Not on file  Social Needs  . Financial resource strain: Not on file  . Food insecurity:    Worry: Not on file    Inability: Not on file  . Transportation needs:    Medical: Not on file    Non-medical: Not on file  Tobacco Use  . Smoking status: Former Smoker    Packs/day: 1.00    Years: 45.00    Pack years: 45.00    Types: Cigarettes    Last attempt to quit: 03/29/2016    Years since quitting: 2.7  . Smokeless tobacco: Never Used  . Tobacco comment: Pt started back 02/08/17 2-3 cigs daily  Substance and Sexual Activity  . Alcohol use: No    Alcohol/week: 0.0 standard drinks  . Drug use: No  . 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 Physical activity:    Days per week: Not on file    Minutes per session: Not on file  . Stress: Not on file  Relationships  . Social connections:    Talks on phone: Not on file    Gets together: Not on file    Attends religious service: Not on file  Active member of club or organization: Not on file    Attends meetings of clubs or organizations: Not on file    Relationship status: Not on file  . Intimate partner violence:    Fear of current or ex partner: Not on file    Emotionally abused: Not on file    Physically abused: Not on file    Forced sexual activity: Not on file  Other Topics Concern  . Not on file  Social History Narrative   Sales.     Past Surgical History:  Procedure Laterality Date  . TONSILLECTOMY      Family History  Problem Relation Age of Onset  . Hypertension Mother   . Allergies Mother   . Asthma Mother   . COPD Mother        previous smoker  . Diabetes Sister   . Alzheimer's disease Maternal  Grandfather   . Heart disease Neg Hx     No Known Allergies  Current Outpatient Medications on File Prior to Visit  Medication Sig Dispense Refill  . lisinopril (PRINIVIL,ZESTRIL) 2.5 MG tablet Take 1 tablet (2.5 mg total) by mouth daily. Needs ov 30 tablet 0  . Spacer/Aero-Holding Chambers (AEROCHAMBER PLUS) inhaler Use as instructed 1 each 2  . amLODipine (NORVASC) 10 MG tablet Take 1 tablet (10 mg total) by mouth daily. (Patient not taking: Reported on 12/16/2018) 30 tablet 0   No current facility-administered medications on file prior to visit.     BP (!) 165/90   Pulse (!) 102   Temp 98.2 F (36.8 C) (Oral)   Resp 16   Ht 5\' 1"  (1.549 m)   Wt 151 lb 3.2 oz (68.6 kg)   SpO2 99%   BMI 28.57 kg/m       Objective:   Physical Exam  General Mental Status- Alert. General Appearance- Not in acute distress.   Skin General: Color- Normal Color. Moisture- Normal Moisture.  Neck Carotid Arteries- Normal color. Moisture- Normal Moisture. No carotid bruits. No JVD.  Chest and Lung Exam Auscultation: Breath Sounds:-shallow breathing. Mild wheezing throughtout.  Cardiovascular Auscultation:Rythm- Regular. Murmurs & Other Heart Sounds:Auscultation of the heart reveals- No Murmurs.  Abdomen Inspection:-Inspeection Normal. Palpation/Percussion:Note:No mass. Palpation and Percussion of the abdomen reveal- Non Tender, Non Distended + BS, no rebound or guarding.  Neurologic Cranial Nerve exam:- CN III-XII intact(No nystagmus), symmetric smile. Strength:- 5/5 equal and symmetric strength both upper and lower extremities.      Assessment & Plan:  For your recent bronchitis with COPD flare, I did prescribe you doxycycline antibiotic.  Also refilled your inhalers and albuterol solution.  In addition went ahead and prescribed 6-day taper dose of prednisone.  For cough making benzonatate available.  I do recommend that you get chest x-ray before you leave for [place] you this  coming Friday.  Orders already been placed.  No appointment needed.  For history of anxiety, I did prescribe your sertraline.  For history of GERD, I did refill your Prevacid.  Your blood pressure is high today.  No cardiac or neurologic signs and symptoms.  You have been off of medications for months.  I want you to take lisinopril 10 mg tablets daily.  Check your blood pressure daily over the next 7 days.  If blood pressure is not less than 140/90 then recommend adding low-dose diuretic.  Please get metabolic panel today.   Follow-up in 2 weeks by my chart.  Please send blood pressure readings and notify us  whether or not you had start the diuretic.  If your wheezing/bronchitis type symptoms worsen or change while in [place] recommend being evaluated there as well.  40 minutes spent with patient today.  50% of time spent counseling patient on treatment regimen going forward.  Particularly for her hypertension, bronchitis and COPD.  Also test based on other chronic conditions such as GERD and anxiety.  Patient has not seen her PCP in a while due to insurance loss.  So she needed her various medications refilled.  Esperanza Richters, PA-C

## 2019-04-15 ENCOUNTER — Other Ambulatory Visit: Payer: Self-pay | Admitting: Medical

## 2019-06-06 ENCOUNTER — Other Ambulatory Visit: Payer: Self-pay | Admitting: Medical

## 2019-07-25 ENCOUNTER — Other Ambulatory Visit: Payer: Self-pay | Admitting: Family Medicine

## 2019-09-02 ENCOUNTER — Other Ambulatory Visit: Payer: Self-pay | Admitting: Family Medicine

## 2019-10-03 ENCOUNTER — Other Ambulatory Visit: Payer: Self-pay | Admitting: Family Medicine

## 2019-10-05 NOTE — Telephone Encounter (Signed)
Pt has not seen PCP since 2017. Had acute with other Provider in office in 2018 and 2020. Pt needs virtual visit with a Provider in this office if she is going to continue to need this medication. Refill denied and mychart message sent to pt.

## 2019-10-28 NOTE — Telephone Encounter (Signed)
12/7//20 mychart message came back as unread. Mailed letter.

## 2024-06-11 NOTE — Progress Notes (Signed)
 This encounter was created in error - please disregard.
